# Patient Record
Sex: Male | Born: 1935 | Race: White | Hispanic: No | Smoking: Never smoker
Health system: Southern US, Community
[De-identification: ages and names within clinical notes are randomized; demographics above are authoritative.]

## PROBLEM LIST (undated history)

## (undated) DIAGNOSIS — F05 Delirium due to known physiological condition: Secondary | ICD-10-CM

## (undated) DIAGNOSIS — N1330 Unspecified hydronephrosis: Secondary | ICD-10-CM

## (undated) DIAGNOSIS — I10 Essential (primary) hypertension: Secondary | ICD-10-CM

## (undated) DIAGNOSIS — F0390 Unspecified dementia without behavioral disturbance: Secondary | ICD-10-CM

## (undated) DIAGNOSIS — D649 Anemia, unspecified: Secondary | ICD-10-CM

## (undated) HISTORY — DX: Delirium due to known physiological condition: F05

## (undated) HISTORY — PX: TIBIA FRACTURE SURGERY: SHX806

## (undated) HISTORY — DX: Essential (primary) hypertension: I10

## (undated) HISTORY — PX: BYPASS GRAFT: SHX909

---

## 2016-08-23 ENCOUNTER — Encounter (HOSPITAL_COMMUNITY): Payer: Self-pay | Admitting: Emergency Medicine

## 2016-08-23 ENCOUNTER — Emergency Department (HOSPITAL_COMMUNITY)
Admission: EM | Admit: 2016-08-23 | Discharge: 2016-08-24 | Disposition: A | Discharge status: Home / Self Care | Payer: Medicare Other | Attending: Emergency Medicine | Admitting: Emergency Medicine

## 2016-08-23 DIAGNOSIS — L03031 Cellulitis of right toe: Secondary | ICD-10-CM | POA: Diagnosis not present

## 2016-08-23 DIAGNOSIS — F0390 Unspecified dementia without behavioral disturbance: Secondary | ICD-10-CM | POA: Insufficient documentation

## 2016-08-23 DIAGNOSIS — Z79899 Other long term (current) drug therapy: Secondary | ICD-10-CM | POA: Diagnosis not present

## 2016-08-23 DIAGNOSIS — M79671 Pain in right foot: Secondary | ICD-10-CM | POA: Diagnosis present

## 2016-08-23 NOTE — ED Triage Notes (Addendum)
Patient just came from [place]. Patient complaining of left foot is warm to touch. Foot has deformities and patient is on antibiotics from podiatry doctor in [place]. Patient has dementia. Patient takes Atenolol, Clonazepam, Ramipril, Zoloft, Zydus, Quetiapine, clindamycin, and ciprofloxacin but do not know the dose per EMS.

## 2016-08-23 NOTE — ED Provider Notes (Addendum)
[facility]-EMERGENCY DEPT Provider Note   CSN: [id number] Arrival date & time: 08/23/16  2239  By signing my name below, I, Nelwyn Salisbury, attest that this documentation has been prepared under the direction and in the presence of Gerhard Munch, MD . Electronically Signed: Nelwyn Salisbury, Scribe. 08/23/2016. 11:07 PM.  History   Chief Complaint Chief Complaint  Patient presents with  . Foot Pain    LEVEL 5 CAVEAT DUE TO DEMENTIA  The history is provided by the patient and a relative. No language interpreter was used.     HPI Comments:  Guy Seese is a 82 y.o. male who presents to the Emergency Department complaining of right foot pain. Pt references being brought here by his father. Pt is unable to answer questions due to dementia. Pt's family states that before they traveled here to [place], the Pt was seen at the family doctor who was concerned about an infection. They were told to bring him to the hospital once they landed. Pt's family reports that he experiences intermittent worsening of his dementia symptoms, and notes that he was walking around in the hotel lobby for 90 minutes with delusions yesterday. Pt's family states that he is currently taking Cipro and Cephelexin for his infection. Family denies any recent dizziness, nausea, or vomiting.   History reviewed. No pertinent past medical history.  There are no active problems to display for this patient.   No past surgical history on file.     Home Medications    Prior to Admission medications   Medication Sig Start Date End Date Taking? Authorizing Provider  atenolol (TENORMIN) 25 MG tablet Take 25 mg by mouth daily.   Yes Historical Provider, MD  ramipril (ALTACE) 10 MG capsule Take 10 mg by mouth daily.   Yes Historical Provider, MD  sertraline (ZOLOFT) 100 MG tablet Take 100 mg by mouth daily.   Yes Historical Provider, MD  ciprofloxacin (CIPRO) 500 MG tablet Take 500 mg by mouth 2 (two) times daily. 08/21/16    Historical Provider, MD  clindamycin (CLEOCIN) 150 MG capsule Take 150 mg by mouth 3 (three) times daily. 08/21/16   Historical Provider, MD  clonazePAM (KLONOPIN) 0.5 MG tablet Take 0.5 mg by mouth at bedtime as needed (FOR SLEEP).    Historical Provider, MD  QUEtiapine (SEROQUEL) 50 MG tablet Take 50 mg by mouth every 12 (twelve) hours. 08/15/16   Historical Provider, MD    Family History History reviewed. No pertinent family history.  Social History Social History  Substance Use Topics  . Smoking status: Not on file  . Smokeless tobacco: Not on file  . Alcohol use Not on file     Allergies   Review of patient's allergies indicates no known allergies.   Review of Systems Review of Systems  Unable to perform ROS: Dementia  Constitutional:       Per HPI, otherwise negative  HENT:       Per HPI, otherwise negative  Respiratory:       Per HPI, otherwise negative  Cardiovascular:       Per HPI, otherwise negative  Gastrointestinal: Negative for vomiting.  Endocrine:       Negative aside from HPI  Genitourinary:       Neg aside from HPI   Musculoskeletal:       Per HPI, otherwise negative  Skin: Negative.   Neurological: Negative for syncope.     Physical Exam Updated Vital Signs BP (!) 201/75 (BP Location: Left Arm)  Pulse 68   Temp 97.6 F (36.4 C) (Oral)   Resp 19   SpO2 100%   Physical Exam  Constitutional: He is oriented to person, place, and time. He appears well-developed. No distress.  HENT:  Head: Normocephalic and atraumatic.  Eyes: Conjunctivae and EOM are normal.  Cardiovascular: Normal rate and regular rhythm.   Pulmonary/Chest: Effort normal. No stridor. No respiratory distress.  Abdominal: He exhibits no distension.  Musculoskeletal: He exhibits edema.  Right distal left extremity has old skin graft on anterior surface that is healthy in appearance. Right foot is edematous but not erythematous. On plantar aspect of great toe, there is a 2cm  open wound. No active bleeding or drainage. Nail is missing. Appreciable DP pulse, Cap refill 2.  Neurological: He is alert and oriented to person, place, and time.  Skin: Skin is warm and dry. Capillary refill takes less than 2 seconds.  Psychiatric: He has a normal mood and affect.  Nursing note and vitals reviewed.    ED Treatments / Results   DIAGNOSTIC STUDIES:  Oxygen Saturation is 100% on RA, normal by my interpretation.    COORDINATION OF CARE:  11:22 PM Discussed treatment plan with pt at bedside and pt agreed to plan.  Labs (all labs ordered are listed, but only abnormal results are displayed) Labs Reviewed  BASIC METABOLIC PANEL - Abnormal; Notable for the following:       Result Value   BUN 21 (*)    Anion gap 4 (*)    All other components within normal limits  CBC WITH DIFFERENTIAL/PLATELET - Abnormal; Notable for the following:    Hemoglobin 11.7 (*)    HCT 37.3 (*)    MCH 25.4 (*)    All other components within normal limits    EKG  EKG Interpretation None       Radiology Dg Foot 2 Views Right  Result Date: 08/24/2016 CLINICAL DATA:  Acute onset of right foot cellulitis, with erythema and pain. Assess for osteomyelitis. Initial encounter. EXAM: RIGHT FOOT - 2 VIEW COMPARISON:  None. FINDINGS: There is no evidence of fracture or dislocation. No definite osseous erosions are seen. There is diffuse osteopenia of visualized osseous structures. Visualized hardware is noted along the distal tibia and fibula. There is question of an underlying chronic fracture through the tibial hardware. There is a chronic fracture of the inferior-most tibial screw. 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 The joint spaces are preserved. There is no evidence of talar subluxation; the subtalar joint is unremarkable in appearance. A plantar calcaneal spur is noted. Calcification is noted overlying the posterior aspect of the calcaneus. Diffuse vascular calcifications  are seen. Dorsal soft tissue swelling is noted at the forefoot. IMPRESSION: 1. No evidence of fracture or dislocation. No osseous erosions seen. 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 2. Diffuse osteopenia of visualized osseous structures limits evaluation for osteomyelitis. If there is significant persistent clinical concern for osteomyelitis, MRI could be considered for further evaluation, as long as there are no contraindications. 3. Question of underlying chronic fracture through the tibial hardware. Chronic fracture of the inferior-most tibial screw. 4. Diffuse vascular calcifications seen. 5. Dorsal soft tissue swelling at the forefoot. Electronically Signed   By: Roanna Raider M.D.   On: 08/24/2016 00:38    Procedures Procedures (including critical care time)  Medications Ordered in ED Medications - No data to display   Initial Impression / Assessment and Plan / ED Course  I  have reviewed the triage vital signs and the nursing notes.  Pertinent labs & imaging results that were available during my care of the patient were reviewed by me and considered in my medical decision making (see chart for details).  Clinical Course    Patient's family has departed, leaving the patient in the emergency department, though I previously inform them if the patient's evaluation was reassuring, he was appropriate for discharge with outpatient follow-up.  This elderly male presents with some concern for right foot wound. The wound itself is generally well healing and appearance, though there is an open area on the plantar surface of the right great toe. Patient has obvious dementia, but no evidence for decompensation here. Though the patient's medical evaluation was generally reassuring, with no evidence for osteomyelitis, bacteremia, sepsis, only local cellulitis, the patient was d/c w family to obtain outpatient f/u   Fi nal Clinical Impressions(s) / ED Diagnoses    I personally  performed the services described in this documentation, which was scribed in my presence. The recorded information has been reviewed and is accurate.       Gerhard Munchobert Krisandra Bueno, MD 08/24/16 [id number]    Gerhard Munchobert Ermine Stebbins, MD 08/24/16 [phone]

## 2016-08-23 NOTE — ED Notes (Signed)
Bed: WA08 Expected date:  Expected time:  Means of arrival:  Comments: EMS 82 yo male foot infection

## 2016-08-24 ENCOUNTER — Emergency Department (HOSPITAL_COMMUNITY): Payer: Medicare Other

## 2016-08-24 DIAGNOSIS — L03031 Cellulitis of right toe: Secondary | ICD-10-CM | POA: Diagnosis not present

## 2016-08-24 LAB — CBC WITH DIFFERENTIAL/PLATELET
BASOS ABS: 0 10*3/uL (ref 0.0–0.1)
BASOS PCT: 0 %
EOS ABS: 0.2 10*3/uL (ref 0.0–0.7)
Eosinophils Relative: 3 %
HEMATOCRIT: 37.3 % — AB (ref 39.0–52.0)
HEMOGLOBIN: 11.7 g/dL — AB (ref 13.0–17.0)
Lymphocytes Relative: 35 %
Lymphs Abs: 2.5 10*3/uL (ref 0.7–4.0)
MCH: 25.4 pg — ABNORMAL LOW (ref 26.0–34.0)
MCHC: 31.4 g/dL (ref 30.0–36.0)
MCV: 81.1 fL (ref 78.0–100.0)
Monocytes Absolute: 0.6 10*3/uL (ref 0.1–1.0)
Monocytes Relative: 8 %
NEUTROS ABS: 3.9 10*3/uL (ref 1.7–7.7)
NEUTROS PCT: 54 %
Platelets: 209 10*3/uL (ref 150–400)
RBC: 4.6 MIL/uL (ref 4.22–5.81)
RDW: 15 % (ref 11.5–15.5)
WBC: 7.3 10*3/uL (ref 4.0–10.5)

## 2016-08-24 LAB — BASIC METABOLIC PANEL
ANION GAP: 4 — AB (ref 5–15)
BUN: 21 mg/dL — ABNORMAL HIGH (ref 6–20)
CALCIUM: 9.2 mg/dL (ref 8.9–10.3)
CO2: 32 mmol/L (ref 22–32)
Chloride: 105 mmol/L (ref 101–111)
Creatinine, Ser: 0.71 mg/dL (ref 0.61–1.24)
Glucose, Bld: 80 mg/dL (ref 65–99)
Potassium: 4.2 mmol/L (ref 3.5–5.1)
SODIUM: 141 mmol/L (ref 135–145)

## 2016-08-24 MED ORDER — CIPROFLOXACIN HCL 500 MG PO TABS
500.0000 mg | ORAL_TABLET | Freq: Two times a day (BID) | ORAL | Status: DC
  Administered 2016-08-24: 500 mg via ORAL
  Filled 2016-08-24: qty 1

## 2016-08-24 MED ORDER — CLONAZEPAM 0.5 MG PO TABS
0.5000 mg | ORAL_TABLET | Freq: Every evening | ORAL | Status: DC | PRN
Start: 2016-08-24 — End: 2016-08-24
  Administered 2016-08-24: 0.5 mg via ORAL
  Filled 2016-08-24: qty 1

## 2016-08-24 MED ORDER — RAMIPRIL 10 MG PO CAPS
10.0000 mg | ORAL_CAPSULE | Freq: Every day | ORAL | Status: DC
Start: 2016-08-24 — End: 2016-08-24

## 2016-08-24 MED ORDER — QUETIAPINE FUMARATE 50 MG PO TABS
50.0000 mg | ORAL_TABLET | Freq: Two times a day (BID) | ORAL | Status: DC
  Administered 2016-08-24: 50 mg via ORAL
  Filled 2016-08-24: qty 1

## 2016-08-24 MED ORDER — CLINDAMYCIN HCL 150 MG PO CAPS: 150.0000 mg | ORAL_CAPSULE | Freq: Three times a day (TID) | ORAL | Status: DC

## 2016-08-24 MED ORDER — SERTRALINE HCL 50 MG PO TABS: 100.0000 mg | ORAL_TABLET | Freq: Every day | ORAL | Status: DC

## 2016-08-24 MED ORDER — ATENOLOL 25 MG PO TABS: 25.0000 mg | ORAL_TABLET | Freq: Every day | ORAL | Status: DC

## 2016-08-24 NOTE — ED Notes (Signed)
Waiting on family to pick him up.

## 2016-08-24 NOTE — ED Notes (Signed)
[facility] [phone]

## 2016-08-24 NOTE — Discharge Instructions (Signed)
As discussed, it is important that you establish care with a local physician. Please use the provided resources to obtain a primary care physician. Equally important is that you follow-up with a wound care specialist to insure that your foot heals. Please take all medication as directed.

## 2016-08-24 NOTE — ED Notes (Signed)
Patient verbally abusive to staff. Daughter arrived and [place]. Patient was helped to the bathroom and helped into the uber.

## 2016-08-24 NOTE — ED Notes (Signed)
Patient right foot was wrapped with gauze, padding placed on big toe, sleeve placed back on foot, and boot was placed back on patients foot.

## 2016-09-03 ENCOUNTER — Encounter: Payer: Self-pay | Admitting: Internal Medicine

## 2016-09-03 ENCOUNTER — Ambulatory Visit: Payer: Medicare Other | Attending: Internal Medicine | Admitting: Internal Medicine

## 2016-09-03 VITALS — BP 149/68 | HR 58 | Temp 97.5°F | Resp 20 | Wt 142.0 lb

## 2016-09-03 DIAGNOSIS — M7989 Other specified soft tissue disorders: Secondary | ICD-10-CM | POA: Diagnosis present

## 2016-09-03 DIAGNOSIS — M8580 Other specified disorders of bone density and structure, unspecified site: Secondary | ICD-10-CM

## 2016-09-03 DIAGNOSIS — F0390 Unspecified dementia without behavioral disturbance: Secondary | ICD-10-CM | POA: Diagnosis not present

## 2016-09-03 DIAGNOSIS — Z789 Other specified health status: Secondary | ICD-10-CM

## 2016-09-03 DIAGNOSIS — R4189 Other symptoms and signs involving cognitive functions and awareness: Secondary | ICD-10-CM | POA: Diagnosis not present

## 2016-09-03 DIAGNOSIS — L03116 Cellulitis of left lower limb: Secondary | ICD-10-CM | POA: Diagnosis not present

## 2016-09-03 DIAGNOSIS — Z79899 Other long term (current) drug therapy: Secondary | ICD-10-CM | POA: Insufficient documentation

## 2016-09-03 MED ORDER — SACCHAROMYCES BOULARDII 250 MG PO CAPS: 250.0000 mg | ORAL_CAPSULE | Freq: Two times a day (BID) | ORAL | 0 refills | Status: DC

## 2016-09-03 MED ORDER — CIPROFLOXACIN HCL 500 MG PO TABS: 500.0000 mg | ORAL_TABLET | Freq: Two times a day (BID) | ORAL | 1 refills | Status: DC

## 2016-09-03 MED ORDER — CLINDAMYCIN HCL 150 MG PO CAPS: 150.0000 mg | ORAL_CAPSULE | Freq: Three times a day (TID) | ORAL | 0 refills | Status: DC

## 2016-09-03 NOTE — Progress Notes (Signed)
Here for Rt ankle pain/edema.  Patient history of dementia. Right calf edema Appt at [facility] October 18th.

## 2016-09-03 NOTE — Progress Notes (Signed)
Scott MainlandJohn Park, is a 82 y.o. male  ZOX:[id number]  [id number]:[id number]  DOB - 10/25/1933  CC:  Chief Complaint  Patient presents with  . Wound Check  . Foot Swelling       HPI: Scott Park is a 82 y.o. male here today to establish medical care.  [place]22[place].  Hx of Mild cognitive impairment, dementia and hx of prior right foot surgery years ago for trauma.  He called his podiatrist in [place] 08/22/16 when they were on their way out of [place], for concern for right foot infection, and was told to go to ER.  Pt subsequently went to our ER on 08/23/16 and was rx cipro and cephelexin.    Per Ex- dgt-in- law here w/ pt, pt does sometimes refuse meds at home, but they think he took all his antibiotics that were prescribed 08/23/16.  DIL states that she thinks the right foot looks worse than the ER visit, w/ more edema, swollen and red.  DIL also states that he does not like to elevate his foot either.  Pt denies pain, does have decrease sensation in the foot.  He denies f/c.    Walks w/ walker. Denies recent falls.    Patient has No headache, No chest pain, No abdominal pain - No Nausea, No new weakness tingling or numbness, No Cough - SOB.    Review of Systems: Per HPI, o/w all systems reviewed, limited given pt's dementia.   No Known Allergies No past medical history on file. Current Outpatient Prescriptions on File Prior to Visit  Medication Sig Dispense Refill  . atenolol (TENORMIN) 25 MG tablet Take 25 mg by mouth daily.    . clonazePAM (KLONOPIN) 0.5 MG tablet Take 0.5 mg by mouth at bedtime as needed (FOR SLEEP).    . ramipril (ALTACE) 10 MG capsule Take 10 mg by mouth daily.    . sertraline (ZOLOFT) 100 MG tablet Take 100 mg by mouth daily.    . QUEtiapine (SEROQUEL) 50 MG tablet Take 50 mg by mouth every 12 (twelve) hours.  5   No current facility-administered medications on file prior to visit.    No family history on file. Social  History   Social History  . Marital status: Widowed    Spouse name: N/A  . Number of children: N/A  . Years of education: N/A   Occupational History  . Not on file.   Social History Main Topics  . Smoking status: Never Smoker  . Smokeless tobacco: Never Used  . Alcohol use Not on file  . Drug use: Unknown  . Sexual activity: Not on file   Other Topics Concern  . Not on file   Social History Narrative  . No narrative on file    Objective:   Vitals:   09/03/16 1405  BP: (!) 149/68  Pulse: (!) 58  Resp: 20  Temp: 97.5 F (36.4 C)    Filed Weights   09/03/16 1405  Weight: 142 lb (64.4 kg)    BP Readings from Last 3 Encounters:  09/03/16 (!) 149/68  08/24/16 156/98    Physical Exam: Constitutional: Patient appears well-developed and well-nourished. No distress.  Pleasantly confused, tangential thought processes.  Walking w/ walker. HENT: Normocephalic, atraumatic, External right and left ear normal. Oropharynx is clear and moist.  Eyes: Conjunctivae and EOM are normal. PERRL, no scleral icterus. Neck: Normal ROM. Neck supple. No JVD.  CVS: RRR, S1/S2 +, no murmurs, no  gallops, no carotid bruit.  Pulmonary: Effort and breath sounds normal, no stridor, rhonchi, wheezes, rales.  Abdominal: Soft. BS +, no distension, tenderness, rebound or guarding.  Musculoskeletal: Normal range of motion. No edema and no tenderness.  LE: left le wnl, no edema. Right foot w/ old scaring/skin graft. Erythema and edema up to ankles, pustes 1+ on right, edema 3+, pitting, there is a 2cm dry callous on plantar aspect of great toe, scaly.  Neuro: Alert. muscle tone coordination wnl. No cranial nerve deficit grossly. Skin: Skin is warm and dry. No rash noted. Not diaphoretic. No erythema. No pallor. Psychiatric: Normal mood and affect. Behavior, judgment, thought content normal.  Lab Results  Component Value Date   WBC 7.3 08/24/2016   HGB 11.7 (L) 08/24/2016   HCT 37.3 (L)  08/24/2016   MCV 81.1 08/24/2016   PLT 209 08/24/2016   Lab Results  Component Value Date   CREATININE 0.71 08/24/2016   BUN 21 (H) 08/24/2016   NA 141 08/24/2016   K 4.2 08/24/2016   CL 105 08/24/2016   CO2 32 08/24/2016    No results found for: HGBA1C Lipid Panel  No results found for: CHOL, TRIG, HDL, CHOLHDL, VLDL, LDLCALC     Depression screen [facility] 2/9 09/03/2016  Decreased Interest 0  Down, Depressed, Hopeless 0  PHQ - 2 Score 0    08/24/16 right foot xray EXAM: RIGHT FOOT - 2 VIEW  COMPARISON:  None.  FINDINGS: There is no evidence of fracture or dislocation. No definite osseous erosions are seen. There is diffuse osteopenia of visualized osseous structures. Visualized hardware is noted along the distal tibia and fibula. There is question of an underlying chronic fracture through the tibial hardware. There is a chronic fracture of the inferior-most tibial screw.  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  The joint spaces are preserved. There is no evidence of talar subluxation; the subtalar joint is unremarkable in appearance. A plantar calcaneal spur is noted. Calcification is noted overlying the posterior aspect of the calcaneus.  Diffuse vascular calcifications are seen. Dorsal soft tissue swelling is noted at the forefoot.  IMPRESSION: 1. No evidence of fracture or dislocation. No osseous erosions seen. 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 2. Diffuse osteopenia of visualized osseous structures limits evaluation for osteomyelitis. If there is significant persistent clinical concern for osteomyelitis, MRI could be considered for further evaluation, as long as there are no contraindications. 3. Question of underlying chronic fracture through the tibial hardware. Chronic fracture of the inferior-most tibial screw. 4. Diffuse vascular calcifications seen. 5. Dorsal soft tissue swelling at the forefoot.   Electronically  Signed   By: Roanna Raider M.D.   On: 08/24/2016 00:38  Assessment and plan:   1. Cellulitis of left foot, concern for osteomyelitis. - MR Foot Right W Wo Contrast; Future - renewed cipro and clindamycin x 7 more days, will need probiotics as well while on abx to prevent diarrhea - pt has Podiatry appt 09/09/16 - high risk for amputation given worrisome exam and failure to recent abx treatment. - hgh risk for needing hospitalization for iv abx and possible surgical debriedment. - f/u w/ me in 2 wks.  2. Osteopenia - MR Foot Right W Wo Contrast; Future  3. Dementia, without behavioral disturbance 4. Cognitive impairment  - mild /mod per DIL 5. Hold off on flu vac for now given acute illness.   Return in about 2 weeks (around 09/17/2016) for foot infection .  The patient was given clear instructions to go to ER or return to medical center if symptoms don't improve, worsen or new problems develop. The patient verbalized understanding. The patient was told to call to get lab results if they haven't heard anything in the next week.    This note has been created with Education officer, environmental. Any transcriptional errors are unintentional.   Pete Glatter, MD, MBA/MHA [facility] [place], [place] [phone]   09/03/2016, 2:59 PM

## 2016-09-03 NOTE — Patient Instructions (Signed)
Cellulitis °Cellulitis is an infection of the skin and the tissue under the skin. The infected area is usually red and tender. This happens most often in the arms and lower legs. °HOME CARE  °· Take your antibiotic medicine as told. Finish the medicine even if you start to feel better. °· Keep the infected arm or leg raised (elevated). °· Put a warm cloth on the area up to 4 times per day. °· Only take medicines as told by your doctor. °· Keep all doctor visits as told. °GET HELP IF: °· You see red streaks on the skin coming from the infected area. °· Your red area gets bigger or turns a dark color. °· Your bone or joint under the infected area is painful after the skin heals. °· Your infection comes back in the same area or different area. °· You have a puffy (swollen) bump in the infected area. °· You have new symptoms. °· You have a fever. °GET HELP RIGHT AWAY IF:  °· You feel very sleepy. °· You throw up (vomit) or have watery poop (diarrhea). °· You feel sick and have muscle aches and pains. °  °This information is not intended to replace advice given to you by your health care provider. Make sure you discuss any questions you have with your health care provider. °  °Document Released: 05/27/2008 Document Revised: 08/30/2015 Document Reviewed: 02/24/2012 °Elsevier Interactive Patient Education ©2016 [employer] Inc. ° °

## 2016-09-09 ENCOUNTER — Encounter: Payer: Self-pay | Admitting: Podiatry

## 2016-09-09 ENCOUNTER — Ambulatory Visit (INDEPENDENT_AMBULATORY_CARE_PROVIDER_SITE_OTHER): Payer: Medicare Other | Admitting: Podiatry

## 2016-09-09 VITALS — Ht 72.0 in | Wt 142.0 lb

## 2016-09-09 DIAGNOSIS — B351 Tinea unguium: Secondary | ICD-10-CM | POA: Diagnosis not present

## 2016-09-09 DIAGNOSIS — L84 Corns and callosities: Secondary | ICD-10-CM

## 2016-09-09 DIAGNOSIS — M79605 Pain in left leg: Secondary | ICD-10-CM | POA: Diagnosis not present

## 2016-09-09 DIAGNOSIS — M79604 Pain in right leg: Secondary | ICD-10-CM

## 2016-09-09 NOTE — Progress Notes (Signed)
   Subjective:    Patient ID: Scott Park, male    DOB: 12/13/1933, 82 y.o.   MRN: [id number]  HPI    Review of Systems     Objective:   Physical Exam        Assessment & Plan:

## 2016-09-09 NOTE — Progress Notes (Signed)
   Subjective:    Patient ID: Scott Park, male    DOB: 06/07/1933, 82 y.o.   MRN: [id number]  HPI Chief Complaint  Patient presents with  . Foot Ulcer    R great toe ... Here with daughter who is concerned of swelling of ankle and calf. Pt just recently moved to [place]       Review of Systems  Constitutional: Positive for unexpected weight change.  HENT: Positive for tinnitus.   Gastrointestinal: Positive for diarrhea.  Musculoskeletal: Positive for back pain and gait problem.  Neurological: Positive for headaches.  Hematological: Bruises/bleeds easily.  Psychiatric/Behavioral: Positive for confusion and hallucinations.       Objective:   Physical Exam        Assessment & Plan:

## 2016-09-10 ENCOUNTER — Ambulatory Visit (HOSPITAL_COMMUNITY): Admission: RE | Admit: 2016-09-10 | Payer: Medicare Other | Source: Ambulatory Visit

## 2016-09-11 NOTE — Progress Notes (Signed)
Subjective:     Patient ID: Scott Park, male   DOB: 01/06/1933, 82 y.o.   MRN: [id number]  HPI patient states that he's just moved here from [place] and he was treated for a callus right hallux with possibility for low-grade ulceration and it seems to be doing better but he needed follow-up and he needs these lesions trimmed   Review of Systems  All other systems reviewed and are negative.      Objective:   Physical Exam  Constitutional: He is oriented to person, place, and time.  Cardiovascular: Intact distal pulses.   Musculoskeletal: Normal range of motion.  Neurological: He is oriented to person, place, and time.  Skin: Skin is warm and dry.  Nursing note and vitals reviewed.  neurovascular status found to be intact with muscle strength diminished range of motion diminished subtalar midtarsal joint. Patient's noted to have keratotic tissue sub-hallux right localized in nature with keratotic tissue noted on other digits localized with digital deformities bilateral. Patient's noted to have good digital perfusion well oriented 3 and also is found to have thickened nails 1-5 both feet that are not currently tender but as they get longer they become more of an issue     Assessment:     Nail infection with keratotic tissue formation with no indication currently of ulceration    Plan:     H&P conditions reviewed debridement accomplished and discussed long-term chronic treatment of conditions. Patient's family wants this done and we will do this on an ongoing basis and no iatrogenic bleeding or drainage noted upon debridement

## 2016-09-18 ENCOUNTER — Encounter (HOSPITAL_BASED_OUTPATIENT_CLINIC_OR_DEPARTMENT_OTHER): Payer: Medicare Other

## 2016-09-25 ENCOUNTER — Ambulatory Visit (INDEPENDENT_AMBULATORY_CARE_PROVIDER_SITE_OTHER): Payer: Medicare Other | Admitting: Podiatry

## 2016-09-25 ENCOUNTER — Encounter: Payer: Self-pay | Admitting: Podiatry

## 2016-09-25 DIAGNOSIS — M79671 Pain in right foot: Secondary | ICD-10-CM | POA: Diagnosis not present

## 2016-09-25 DIAGNOSIS — M779 Enthesopathy, unspecified: Secondary | ICD-10-CM | POA: Diagnosis not present

## 2016-09-25 DIAGNOSIS — M79605 Pain in left leg: Secondary | ICD-10-CM

## 2016-09-25 DIAGNOSIS — M79604 Pain in right leg: Secondary | ICD-10-CM | POA: Diagnosis not present

## 2016-09-26 NOTE — Progress Notes (Signed)
Subjective:     Patient ID: Scott Park, male   DOB: 07/12/1933, 82 y.o.   MRN: [id number]  HPI patient presents with caregiver in a belligerent mood and stating his right foot hurts but is unable to explain how   Review of Systems     Objective:   Physical Exam Neurovascular status unchanged with patient found to have minimal discomfort when I pressed from a plantar direction and is wearing surgical shoe currently    Assessment:     Difficult to understand why he is having this pain and he is quite belligerent with myself and with caregiver    Plan:     Attempted treatment and spent a good tilt time with him but I was unable to get him to focus and I had to have him leave the office at this time

## 2016-09-30 ENCOUNTER — Telehealth: Payer: Self-pay | Admitting: Internal Medicine

## 2016-09-30 ENCOUNTER — Telehealth: Payer: Self-pay

## 2016-09-30 NOTE — Telephone Encounter (Signed)
Patient dropped off paper for handicapped placard to be completed by the doctor. Please follow up.

## 2016-09-30 NOTE — Telephone Encounter (Signed)
Will forward to pcp

## 2016-09-30 NOTE — Telephone Encounter (Signed)
Contacted Bonita QuinLinda to make aware that handicapped form was ready for pick up lvm regarding information and if she has any questions or concerns to give me a call

## 2016-09-30 NOTE — Telephone Encounter (Signed)
Completed today. Gave to cma

## 2016-10-03 ENCOUNTER — Telehealth: Payer: Self-pay | Admitting: Internal Medicine

## 2016-10-03 NOTE — Telephone Encounter (Signed)
Patients daughter called requesting a refill for clonezapem. Please follow up.

## 2016-10-10 NOTE — Telephone Encounter (Signed)
Pt. Daughter called requesting a refill on clonazePAM (KLONOPIN) 0.5 MG tablet  Please f/u

## 2016-10-14 ENCOUNTER — Ambulatory Visit: Payer: Medicare Other | Admitting: Internal Medicine

## 2016-10-14 MED ORDER — CLONAZEPAM 0.5 MG PO TABS: 0.5000 mg | ORAL_TABLET | Freq: Every evening | ORAL | 0 refills | Status: DC | PRN

## 2016-10-14 NOTE — Telephone Encounter (Signed)
I renewed this rx for patient for now, but try to wean down as able, to 1/2 pill as needed for sleep.  Not best medicine for him in advance age. Please call. thanks

## 2016-10-15 NOTE — Telephone Encounter (Signed)
Contacted pt daughter and made aware of rx

## 2016-10-24 ENCOUNTER — Other Ambulatory Visit: Payer: Medicare Other

## 2016-11-13 ENCOUNTER — Ambulatory Visit (INDEPENDENT_AMBULATORY_CARE_PROVIDER_SITE_OTHER): Payer: Medicare Other | Admitting: Podiatry

## 2016-11-13 ENCOUNTER — Encounter: Payer: Self-pay | Admitting: Podiatry

## 2016-11-13 VITALS — BP 160/71 | HR 66 | Resp 16

## 2016-11-13 DIAGNOSIS — L97501 Non-pressure chronic ulcer of other part of unspecified foot limited to breakdown of skin: Secondary | ICD-10-CM

## 2016-11-13 NOTE — Progress Notes (Signed)
Subjective:     Patient ID: Scott Park, male   DOB: 03/07/1933, [age] y.o.   MRN: [id number]  HPI patient presents with crusted tissue the end of the right big toe that is localized in nature with no proximal edema erythema drainage noted. Patient does have significant damage to the right lower leg and history of circulatory issues which are dealt with by physician. Patient is not completely aware assessment at this time   Review of Systems     Objective:   Physical Exam Neurovascular status unchanged with patient noted to have distal keratotic lesion right hallux plantar surface with no active drainage no proximal edema erythema drainage noted. Measures approximately 4 x 4 mm and is localized with a healthy base that's granulating with no indication again of systemic pathology    Assessment:     Localized ulceration right and it hallux secondary to gait pattern    Plan:     Discussed and this is very difficult to offload and I did apply padding systems offload and applied Iodosorb to the area after debridement with no drainage noted. I gave strict instructions of any drainage or any other changes should occur to let us know immediately and if not we will utilize padding to keep pressure off the area along with Neosporin and reduced activity. Encouraged to call with any questions and I did explain to caregiver it's possible amputation may be necessary at one point in future

## 2016-11-19 ENCOUNTER — Telehealth: Payer: Self-pay | Admitting: Internal Medicine

## 2016-11-19 DIAGNOSIS — R4189 Other symptoms and signs involving cognitive functions and awareness: Secondary | ICD-10-CM

## 2016-11-19 NOTE — Telephone Encounter (Signed)
Patients relative is  Calling and stated that patient needs a preventive cardiologist. Patient is also asking to be referred to someone who specializes in dementia.

## 2016-11-19 NOTE — Telephone Encounter (Signed)
Will forward to pcp

## 2016-11-19 NOTE — Telephone Encounter (Signed)
Please have pt set up appt w/ me w/in 1 month, have not seen since sept. I am not certain what a preventative cardiologist is.. Please have them f/u w/ me first.    I put in referral for neuro for the formal dementia wkup. thanks

## 2016-11-19 NOTE — Telephone Encounter (Signed)
Patient needs a refill for Zoloft. please follow up.

## 2016-11-20 NOTE — Telephone Encounter (Signed)
Chadell if you would be able to call patient and set up an appointment when the schedule opens thanks

## 2016-11-20 NOTE — Telephone Encounter (Signed)
Contacted Bonita QuinLinda to make aware that Dr. Julien NordmannLangeland has put the referral in for the neuro but she would like him to schedule an appoitnment with her she is aware and understands and doesn't have any questions or concerns

## 2016-11-25 ENCOUNTER — Ambulatory Visit: Payer: Medicare Other | Attending: Internal Medicine | Admitting: Internal Medicine

## 2016-11-25 ENCOUNTER — Encounter: Payer: Self-pay | Admitting: Internal Medicine

## 2016-11-25 VITALS — BP 143/71 | HR 66 | Temp 97.6°F | Resp 16 | Wt 165.2 lb

## 2016-11-25 DIAGNOSIS — D519 Vitamin B12 deficiency anemia, unspecified: Secondary | ICD-10-CM | POA: Diagnosis not present

## 2016-11-25 DIAGNOSIS — R600 Localized edema: Secondary | ICD-10-CM

## 2016-11-25 DIAGNOSIS — D649 Anemia, unspecified: Secondary | ICD-10-CM

## 2016-11-25 DIAGNOSIS — Z23 Encounter for immunization: Secondary | ICD-10-CM | POA: Diagnosis not present

## 2016-11-25 DIAGNOSIS — F0391 Unspecified dementia with behavioral disturbance: Secondary | ICD-10-CM

## 2016-11-25 DIAGNOSIS — I10 Essential (primary) hypertension: Secondary | ICD-10-CM

## 2016-11-25 DIAGNOSIS — D529 Folate deficiency anemia, unspecified: Secondary | ICD-10-CM

## 2016-11-25 DIAGNOSIS — R4182 Altered mental status, unspecified: Secondary | ICD-10-CM | POA: Diagnosis not present

## 2016-11-25 DIAGNOSIS — E559 Vitamin D deficiency, unspecified: Secondary | ICD-10-CM | POA: Diagnosis not present

## 2016-11-25 DIAGNOSIS — F03918 Unspecified dementia, unspecified severity, with other behavioral disturbance: Secondary | ICD-10-CM | POA: Insufficient documentation

## 2016-11-25 LAB — BASIC METABOLIC PANEL WITH GFR
BUN: 18 mg/dL (ref 7–25)
CHLORIDE: 102 mmol/L (ref 98–110)
CO2: 32 mmol/L — AB (ref 20–31)
Calcium: 9.4 mg/dL (ref 8.6–10.3)
Creat: 0.82 mg/dL (ref 0.70–1.11)
GFR, EST NON AFRICAN AMERICAN: 83 mL/min (ref >=60)
GFR, Est African American: >89 mL/min (ref >=60)
Glucose, Bld: 102 mg/dL — ABNORMAL HIGH (ref 65–99)
Potassium: 4.2 mmol/L (ref 3.5–5.3)
SODIUM: 142 mmol/L (ref 135–146)

## 2016-11-25 LAB — CBC WITH DIFFERENTIAL/PLATELET
BASOS ABS: 0 {cells}/uL (ref 0–200)
Basophils Relative: 0 %
EOS PCT: 1 %
Eosinophils Absolute: 96 cells/uL (ref 15–500)
HCT: 38.6 % (ref 38.5–50.0)
Hemoglobin: 11.7 g/dL — ABNORMAL LOW (ref 13.2–17.1)
LYMPHS PCT: 11 %
Lymphs Abs: 1056 cells/uL (ref 850–3900)
MCH: 25.3 pg — AB (ref 27.0–33.0)
MCHC: 30.3 g/dL — AB (ref 32.0–36.0)
MCV: 83.5 fL (ref 80.0–100.0)
MONOS PCT: 7 %
MPV: 9.7 fL (ref 7.5–12.5)
Monocytes Absolute: 672 cells/uL (ref 200–950)
NEUTROS PCT: 81 %
Neutro Abs: 7776 cells/uL (ref 1500–7800)
Platelets: 287 10*3/uL (ref 140–400)
RBC: 4.62 MIL/uL (ref 4.20–5.80)
RDW: 14.8 % (ref 11.0–15.0)
WBC: 9.6 10*3/uL (ref 3.8–10.8)

## 2016-11-25 LAB — VITAMIN B12: Vitamin B-12: 335 pg/mL (ref 200–1100)

## 2016-11-25 LAB — FOLATE: Folate: 14.2 ng/mL (ref >5.4)

## 2016-11-25 MED ORDER — SERTRALINE HCL 100 MG PO TABS
100.0000 mg | ORAL_TABLET | Freq: Every day | ORAL | 3 refills | Status: DC
Start: 2016-11-25 — End: 2017-01-28

## 2016-11-25 MED ORDER — ATENOLOL 25 MG PO TABS
25.0000 mg | ORAL_TABLET | Freq: Every day | ORAL | 3 refills | Status: DC
Start: 2016-11-25 — End: 2017-02-27

## 2016-11-25 MED ORDER — RAMIPRIL 10 MG PO CAPS: 10.0000 mg | ORAL_CAPSULE | Freq: Every day | ORAL | 3 refills | Status: DC

## 2016-11-25 NOTE — Progress Notes (Signed)
Pt is in the office today for a follow up Pt states he is not in any pain

## 2016-11-25 NOTE — Patient Instructions (Addendum)
Scott Park /rn bp check 2 wks     Edema Edema is an abnormal buildup of fluids. It is more common in your legs and thighs. Painless swelling of the feet and ankles is more likely as a person ages. It also is common in looser skin, like around your eyes. Follow these instructions at home:  Keep the affected body part above the level of the heart while lying down.  Do not sit still or stand for a long time.  Do not put anything right under your knees when you lie down.  Do not wear tight clothes on your upper legs.  Exercise your legs to help the puffiness (swelling) go down.  Wear elastic bandages or support stockings as told by your doctor.  A low-salt diet may help lessen the puffiness.  Only take medicine as told by your doctor. Contact a doctor if:  Treatment is not working.  You have heart, liver, or kidney disease and notice that your skin looks puffy or shiny.  You have puffiness in your legs that does not get better when you raise your legs.  You have sudden weight gain for no reason. Get help right away if:  You have shortness of breath or chest pain.  You cannot breathe when you lie down.  You have pain, redness, or warmth in the areas that are puffy.  You have heart, liver, or kidney disease and get edema all of a sudden.  You have a fever and your symptoms get worse all of a sudden. This information is not intended to replace advice given to you by your health care provider. Make sure you discuss any questions you have with your health care provider. Document Released: 05/27/2008 Document Revised: 05/16/2016 Document Reviewed: 10/01/2013 Elsevier Interactive Patient Education  2017 Elsevier Inc. Influenza Virus Vaccine injection (Fluarix) What is this medicine? INFLUENZA VIRUS VACCINE (in floo EN zuh VAHY ruhs vak SEEN) helps to reduce the risk of getting influenza also known as the flu. This medicine may be used for other purposes; ask your health care  provider or pharmacist if you have questions. COMMON BRAND NAME(S): Fluarix, Fluzone What should I tell my health care provider before I take this medicine? They need to know if you have any of these conditions: -bleeding disorder like hemophilia -fever or infection -Guillain-Barre syndrome or other neurological problems -immune system problems -infection with the human immunodeficiency virus (HIV) or AIDS -low blood platelet counts -multiple sclerosis -an unusual or allergic reaction to influenza virus vaccine, eggs, chicken proteins, latex, gentamicin, other medicines, foods, dyes or preservatives -pregnant or trying to get pregnant -breast-feeding How should I use this medicine? This vaccine is for injection into a muscle. It is given by a health care professional. A copy of Vaccine Information Statements will be given before each vaccination. Read this sheet carefully each time. The sheet may change frequently. Talk to your pediatrician regarding the use of this medicine in children. Special care may be needed. Overdosage: If you think you have taken too much of this medicine contact a poison control center or emergency room at once. NOTE: This medicine is only for you. Do not share this medicine with others. What if I miss a dose? This does not apply. What may interact with this medicine? -chemotherapy or radiation therapy -medicines that lower your immune system like etanercept, anakinra, infliximab, and adalimumab -medicines that treat or prevent blood clots like warfarin -phenytoin -steroid medicines like prednisone or cortisone -theophylline -vaccines This list  may not describe all possible interactions. Give your health care provider a list of all the medicines, herbs, non-prescription drugs, or dietary supplements you use. Also tell them if you smoke, drink alcohol, or use illegal drugs. Some items may interact with your medicine. What should I watch for while using this  medicine? Report any side effects that do not go away within 3 days to your doctor or health care professional. Call your health care provider if any unusual symptoms occur within 6 weeks of receiving this vaccine. You may still catch the flu, but the illness is not usually as bad. You cannot get the flu from the vaccine. The vaccine will not protect against colds or other illnesses that may cause fever. The vaccine is needed every year. What side effects may I notice from receiving this medicine? Side effects that you should report to your doctor or health care professional as soon as possible: -allergic reactions like skin rash, itching or hives, swelling of the face, lips, or tongue Side effects that usually do not require medical attention (report to your doctor or health care professional if they continue or are bothersome): -fever -headache -muscle aches and pains -pain, tenderness, redness, or swelling at site where injected -weak or tired This list may not describe all possible side effects. Call your doctor for medical advice about side effects. You may report side effects to FDA at 1-800-FDA-1088. Where should I keep my medicine? This vaccine is only given in a clinic, pharmacy, doctor's office, or other health care setting and will not be stored at home. NOTE: This sheet is a summary. It may not cover all possible information. If you have questions about this medicine, talk to your doctor, pharmacist, or health care provider.  2017 Elsevier/Gold Standard (2008-07-06 09:30:40)

## 2016-11-26 ENCOUNTER — Other Ambulatory Visit: Payer: Self-pay | Admitting: Internal Medicine

## 2016-11-26 DIAGNOSIS — R600 Localized edema: Secondary | ICD-10-CM

## 2016-11-26 LAB — IRON,TIBC AND FERRITIN PANEL
%SAT: 10 % — AB (ref 15–60)
FERRITIN: 99 ng/mL (ref 20–380)
IRON: 32 ug/dL — AB (ref 50–180)
TIBC: 331 ug/dL (ref 250–425)

## 2016-11-26 LAB — RPR

## 2016-11-26 LAB — BRAIN NATRIURETIC PEPTIDE: BRAIN NATRIURETIC PEPTIDE: 149.5 pg/mL — AB (ref <100)

## 2016-11-26 LAB — VITAMIN D 25 HYDROXY (VIT D DEFICIENCY, FRACTURES): VIT D 25 HYDROXY: 25 ng/mL — AB (ref 30–100)

## 2016-11-26 MED ORDER — SENNOSIDES-DOCUSATE SODIUM 8.6-50 MG PO TABS: 1.0000 | ORAL_TABLET | Freq: Every evening | ORAL | 2 refills | Status: DC | PRN

## 2016-11-26 MED ORDER — FERROUS SULFATE 325 (65 FE) MG PO TABS: 325.0000 mg | ORAL_TABLET | Freq: Every day | ORAL | 3 refills | Status: DC

## 2016-11-26 MED ORDER — VITAMIN D (ERGOCALCIFEROL) 1.25 MG (50000 UNIT) PO CAPS: 50000.0000 [IU] | ORAL_CAPSULE | ORAL | 0 refills | Status: DC

## 2016-11-26 NOTE — Progress Notes (Signed)
Scott Park, is a 82 y.o. male  ZOX:[id number]:[id number]  [id number]  DOB - 04/06/1933  Chief Complaint  Patient presents with  . Follow-up        Subjective:   Scott Park is a 82 y.o. 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  Pt denies c/o, no issues, although hx is limited to patient's confusion.  Pt ex-dgt-in-law, he has been agitated lately, and the rare times they use Klonopin (not prescribed from this office), appears  To be working less and less. They have a neurology apt pending soon for formal dementia workup.  Of note, prior to moving here, pt saw "Preventative cardiologist", with regular stress test per ex-DIL, although she does not know the results of the studies. Nor does she know if pt has every had MI/cardiac procedures, as far as she knows.      Patient has No headache, No chest pain, No abdominal pain - No Nausea, No new weakness tingling or numbness, No Cough - SOB.  Per ex-DIL, pt has been sleeping in recliner for years w/ his legs up. Recently recliner has broken, so he is unable to elevate his legs.  They notice increasing swelling in his legs lately.  t also noted to be very confused the other night and was wondering around the house unattended for time til discovered.  ROS/ limited due to pt's confusion/dementia.  No problems updated.  ALLERGIES: Allergies  Allergen Reactions  . Codeine Anxiety    PAST MEDICAL HISTORY: No past medical history on file.  MEDICATIONS AT HOME: Prior to Admission medications   Medication Sig Start Date End Date Taking? Authorizing Provider  aspirin EC 81 MG tablet Take 81 mg by mouth daily.    Historical Provider, MD  atenolol (TENORMIN) 25 MG tablet Take 1 tablet (25 mg total) by mouth daily. 11/25/16   Pete Glatterawn T Evamaria Detore, MD  clonazePAM (KLONOPIN) 0.5 MG  tablet Take 1 tablet (0.5 mg total) by mouth at bedtime as needed (FOR SLEEP). 10/14/16   Pete Glatterawn T Horris Speros, MD  QUEtiapine (SEROQUEL) 50 MG tablet Take 50 mg by mouth every 12 (twelve) hours. 08/15/16   Historical Provider, MD  ramipril (ALTACE) 10 MG capsule Take 1 capsule (10 mg total) by mouth daily. 11/25/16   Pete Glatterawn T Britnay Magnussen, MD  saccharomyces boulardii (FLORASTOR) 250 MG capsule Take 1 capsule (250 mg total) by mouth 2 (two) times daily. Patient not taking: Reported on 11/25/2016 09/03/16   Pete Glatterawn T Jeri Rawlins, MD  sertraline (ZOLOFT) 100 MG tablet Take 1 tablet (100 mg total) by mouth daily. 11/25/16   Pete Glatterawn T Ejay Lashley, MD     Objective:   Vitals:   11/25/16 1434  BP: (!) 143/71  Pulse: 66  Resp: 16  Temp: 97.6 F (36.4 C)  TempSrc: Oral  SpO2: 98%  Weight: 165 lb 3.2 oz (74.9 kg)    Exam General appearance : Awake, alert, not in any distress. Speech Clear, but disorganized/confused. Not toxic looking, thin appearing. HEENT: Atraumatic and Normocephalic, pupils equally reactive to light. Neck: supple, no JVD.  Chest:Good air entry bilaterally, no added sounds. CVS: S1 S2 regular, no murmurs/gallups or rubs. Abdomen: Bowel sounds active, Non tender and not distended with no gaurding, rigidity or rebound. Extremities: B/L Lower Ext shows 1+ edema bilat, both legs are warm to touch Neurology: Awake  alert, confused.  CN II-XII grossly intact, Non focal Skin:No Rash  Data Review No results found for: HGBA1C  Depression screen [facility] 2/9 11/25/2016 09/03/2016  Decreased Interest 0 0  Down, Depressed, Hopeless 0 0  PHQ - 2 Score 0 0      Assessment & Plan   1. HTN (hypertension), benign Slightly elevated.  Given pt's confusion - BASIC METABOLIC PANEL WITH GFR - keep meds same for now, ramipril and atenolol - hesitant about adding diurectic due to confusion, and concern for dehyration. - RN bp chk in 2 wks - if SBP remains >140s, increase ramipril to 20mg  qday.  2. Dementia  with behavioral disturbance, unspecified dementia type - Neuro appt pending for further eval, per ex-DIL, she has pt's outside MRI brain scan on disc as well. - RPR. chk b12 and folate as well  3. Pedal edema, suspect due to dependent edema - rx for compression stockings provided - Brain natriuretic peptide  4. Anemia, unspecified type - Iron, TIBC and Ferritin Panel - CBC with Differential - chk  5. Vitamin D deficiency concerns - Vitamin D, 25-hydroxy  6. Altered mental status, unspecified altered mental status type See #2 - renewed seroquel and zoloft, per ex-DIL, pt has been on it for years, if does not take it, becomes extremely agitated/confused.  7. Anemia due to folic acid deficiency, concerns - chk folate (also for dementia workup)  8. Anemia due to vitamin B12 deficiency, concerns - Vitamin B12 (also part of dementia workup.)   9. Encounter for immunization - Flu Vaccine QUAD 36+ mos IM     Patient have been counseled extensively about nutrition and exercise  Return in about 2 months (around 01/26/2017).  The patient was given clear instructions to go to ER or return to medical center if symptoms don't improve, worsen or new problems develop. The patient verbalized understanding. The patient was told to call to get lab results if they haven't heard anything in the next week.   This note has been created with Education officer, environmentalDragon speech recognition software and smart phrase technology. Any transcriptional errors are unintentional.   Pete Glatterawn T Karima Carrell, MD, MBA/MHA [facility] [place], [place] [phone]   11/26/2016, 9:04 AM

## 2016-11-27 ENCOUNTER — Encounter: Payer: Self-pay | Admitting: Internal Medicine

## 2016-11-27 NOTE — Progress Notes (Unsigned)
Documentation received from [facility],  [place], [place], [place] below//  Noted on PN 08/18/15; AScvd, cardiac cath  1/11 revealed mild ostial stenosis of LAD, mild anterior wall hypokinesia, ef 55%   1. Carotid intimal media thickness, 11/11 - per report, C-IMT avg 1.9119mm  2. Myocardial perfusion imaging, 08/21/09 abnml perfusion scan, showing an inferoseptal fixed defect c/w prior MI w/ moderate peri infarct ischemia mostly in inferior wall  3. Echo 10/23/10  Ef 65-70%, mild concetric lv hypertrophy  4. carotoid u/s. 5/12; repeated 09/06/13 similar report;  Less 50% bilat carotids  5. Carotid us 09/15/14 1. Total occlusion right ext carotid artery 2. [place] <50% 3. Lica, c/w 50-70% stenosis   6. abd aortic us. 11/05/12 No AAA, atherosclerotic changes noted.  7. Echo 10/29/12 Ef 65-70%  8. Echo 07/24/15 Ef 55%   Progress note / Dr Tory EmeraldJoseph Horgan. 01/11/16 1. htn 2. Hypercholesteremia 3. Anemia, 4. Left ventricular hypertrophy 5. Noncompliance w/ meds 6. Dementia w/o behavioral disturbances 7. Predm,   pn 08/18/15 1. Equivalent angina 2. AScvd, cardiac cath  1/11 revealed mild ostial stenosis of LAD, mild anterior wall hypokinesia, ef 55% 3. hld 4. Carotid art stenosis,  5. Localized edema, mainly right leg from prior injury.

## 2016-11-29 ENCOUNTER — Telehealth: Payer: Self-pay

## 2016-11-29 NOTE — Telephone Encounter (Signed)
Contacted pt to go over lab results spoke with patient son and went over results pt is aware and doesn't have any questions or concerns

## 2016-12-02 ENCOUNTER — Ambulatory Visit: Payer: Medicare Other | Admitting: Podiatry

## 2016-12-03 ENCOUNTER — Ambulatory Visit: Payer: Medicare Other | Admitting: Sports Medicine

## 2016-12-13 ENCOUNTER — Ambulatory Visit: Payer: Medicare Other | Admitting: Diagnostic Neuroimaging

## 2016-12-13 ENCOUNTER — Emergency Department (HOSPITAL_COMMUNITY)
Admission: EM | Admit: 2016-12-13 | Discharge: 2016-12-13 | Disposition: A | Discharge status: Home / Self Care | Payer: Medicare Other | Attending: Emergency Medicine | Admitting: Emergency Medicine

## 2016-12-13 ENCOUNTER — Encounter (HOSPITAL_COMMUNITY): Payer: Self-pay | Admitting: Emergency Medicine

## 2016-12-13 DIAGNOSIS — Z79899 Other long term (current) drug therapy: Secondary | ICD-10-CM | POA: Diagnosis not present

## 2016-12-13 DIAGNOSIS — Z7982 Long term (current) use of aspirin: Secondary | ICD-10-CM | POA: Diagnosis not present

## 2016-12-13 DIAGNOSIS — I10 Essential (primary) hypertension: Secondary | ICD-10-CM | POA: Diagnosis not present

## 2016-12-13 DIAGNOSIS — F0390 Unspecified dementia without behavioral disturbance: Secondary | ICD-10-CM | POA: Diagnosis present

## 2016-12-13 DIAGNOSIS — F0391 Unspecified dementia with behavioral disturbance: Secondary | ICD-10-CM | POA: Insufficient documentation

## 2016-12-13 NOTE — ED Provider Notes (Signed)
[facility]-EMERGENCY DEPT Provider Note   CSN: [id number] Arrival date & time: 12/13/16  1530     History   Chief Complaint Chief Complaint  Patient presents with  . Dementia    HPI Scott Park is a 82 y.o. male.  HPI  Remainder of history, ROS, and physical exam limited due to patient's condition (demetia). Additional information was obtained from either EMS or family.   Level V Caveat.  Patient brought in by EMS for bizarre behavior. They report the patient lives in a apartment complex and has a history of dementia. Patient went to the complex office without pants on.   Currently waiting for family to arrive.  Family reported that the patient has been having worsening dementia over the past several months. He does live alone in apartment however the son tries to stay with the patient as much as he can. Patient travels and is away 2 weeks out of the month. Patient's daughter-in-law lives close by and checks up on him frequently. They deny any recent fevers, illnesses, or infections. They're requesting a social work Administrator, sportsconsultation.  History reviewed. No pertinent past medical history.  Patient Active Problem List   Diagnosis Date Noted  . HTN (hypertension), benign 11/25/2016  . Dementia with behavioral disturbance 11/25/2016  . Pedal edema 11/25/2016    History reviewed. No pertinent surgical history.     Home Medications    Prior to Admission medications   Medication Sig Start Date End Date Taking? Authorizing Provider  aspirin EC 81 MG tablet Take 81 mg by mouth daily.    Historical Provider, MD  atenolol (TENORMIN) 25 MG tablet Take 1 tablet (25 mg total) by mouth daily. 11/25/16   Pete Glatterawn T Langeland, MD  clonazePAM (KLONOPIN) 0.5 MG tablet Take 1 tablet (0.5 mg total) by mouth at bedtime as needed (FOR SLEEP). 10/14/16   Pete Glatterawn T Langeland, MD  ferrous sulfate (FERROUSUL) 325 (65 FE) MG tablet Take 1 tablet (325 mg total) by mouth daily with breakfast. 11/26/16   Pete Glatterawn T  Langeland, MD  QUEtiapine (SEROQUEL) 50 MG tablet Take 50 mg by mouth every 12 (twelve) hours. 08/15/16   Historical Provider, MD  ramipril (ALTACE) 10 MG capsule Take 1 capsule (10 mg total) by mouth daily. 11/25/16   Pete Glatterawn T Langeland, MD  saccharomyces boulardii (FLORASTOR) 250 MG capsule Take 1 capsule (250 mg total) by mouth 2 (two) times daily. Patient not taking: Reported on 11/25/2016 09/03/16   Pete Glatterawn T Langeland, MD  senna-docusate (SENOKOT-S) 8.6-50 MG tablet Take 1 tablet by mouth at bedtime as needed for mild constipation. While on iron pills 11/26/16   Pete Glatterawn T Langeland, MD  sertraline (ZOLOFT) 100 MG tablet Take 1 tablet (100 mg total) by mouth daily. 11/25/16   Pete Glatterawn T Langeland, MD  Vitamin D, Ergocalciferol, (DRISDOL) 50000 units CAPS capsule Take 1 capsule (50,000 Units total) by mouth every 7 (seven) days. 11/26/16   Pete Glatterawn T Langeland, MD    Family History No family history on file.  Social History Social History  Substance Use Topics  . Smoking status: Never Smoker  . Smokeless tobacco: Never Used  . Alcohol use Not on file     Allergies   Codeine   Review of Systems Review of Systems  Unable to perform ROS: Dementia     Physical Exam Updated Vital Signs BP 174/65 (BP Location: Right Arm)   Pulse 72   Temp 98.4 F (36.9 C) (Oral)   Resp 18   SpO2 98%  Physical Exam  Constitutional: He appears well-developed and well-nourished. No distress.  HENT:  Head: Normocephalic and atraumatic.  Nose: Nose normal.  Eyes: Conjunctivae and EOM are normal. Pupils are equal, round, and reactive to light. Right eye exhibits no discharge. Left eye exhibits no discharge. No scleral icterus.  Neck: Normal range of motion. Neck supple.  Cardiovascular: Normal rate and regular rhythm.  Exam reveals no gallop and no friction rub.   No murmur heard. Pulmonary/Chest: Effort normal and breath sounds normal. No stridor. No respiratory distress. He has no rales.  Abdominal: Soft. He  exhibits no distension. There is no tenderness.  Musculoskeletal: He exhibits no edema or tenderness.  Neurological: He is alert. He is disoriented.  Pleasantly demented  Skin: Skin is warm and dry. No rash noted. He is not diaphoretic. No erythema.  Psychiatric: He has a normal mood and affect.  Vitals reviewed.    ED Treatments / Results  Labs (all labs ordered are listed, but only abnormal results are displayed) Labs Reviewed - No data to display  EKG  EKG Interpretation None       Radiology No results found.  Procedures Procedures (including critical care time)  Medications Ordered in ED Medications - No data to display   Initial Impression / Assessment and Plan / ED Course  I have reviewed the triage vital signs and the nursing notes.  Pertinent labs & imaging results that were available during my care of the patient were reviewed by me and considered in my medical decision making (see chart for details).  Clinical Course     Discussed case with the social worker who evaluated the patient in the emergency department and determined that the patient does not meet criteria for placement from the emergency department. Family was provided with resources.  The patient is safe for discharge with strict return precautions.   Final Clinical Impressions(s) / ED Diagnoses   Final diagnoses:  Dementia with behavioral disturbance, unspecified dementia type   Disposition: Discharge  Condition: Good  I have discussed the results, Dx and Tx plan with the patient's family who expressed understanding and agree(s) with the plan. Discharge instructions discussed at great length. The patient's family was given strict return precautions who verbalized understanding of the instructions. No further questions at time of discharge.    Discharge Medication List as of 12/13/2016  8:23 PM      Follow Up: Pete Glatterawn T Langeland, MD [address] [place]  [id number] [phone]  Schedule an appointment as soon as possible for a visit  For close follow up to assess for placement      Nira ConnPedro Eduardo Cardama, MD 12/15/16 0136

## 2016-12-13 NOTE — ED Notes (Signed)
ED Provider at bedside. 

## 2016-12-13 NOTE — ED Triage Notes (Signed)
Per EMS pt alert to self with hx of dementia. Son verbalizes dementia has gotten worse. Pt sent for evaluation per request of apartment complex persons verbalizing pt running to office nude.

## 2016-12-13 NOTE — ED Notes (Signed)
Dr. Eudelia Bunchardama speaking with family member about pt's status.

## 2016-12-13 NOTE — ED Notes (Signed)
Pt's son called and stated he is [place] and he will pick up pt when he gets into town.

## 2016-12-13 NOTE — Progress Notes (Signed)
CSW provided patients family members with list of assisted living facilities/ memory care.   Stacy GardnerErin Channing Savich, LCSWA Clinical Social Worker [phone]

## 2016-12-25 ENCOUNTER — Ambulatory Visit: Payer: Medicare Other | Admitting: Cardiovascular Disease

## 2016-12-26 ENCOUNTER — Encounter: Payer: Self-pay | Admitting: Podiatry

## 2016-12-26 ENCOUNTER — Ambulatory Visit (INDEPENDENT_AMBULATORY_CARE_PROVIDER_SITE_OTHER): Payer: Medicare Other | Admitting: Podiatry

## 2016-12-26 VITALS — BP 112/71 | HR 56 | Resp 16

## 2016-12-26 DIAGNOSIS — M79676 Pain in unspecified toe(s): Secondary | ICD-10-CM | POA: Diagnosis not present

## 2016-12-26 DIAGNOSIS — B351 Tinea unguium: Secondary | ICD-10-CM | POA: Diagnosis not present

## 2016-12-26 DIAGNOSIS — L97401 Non-pressure chronic ulcer of unspecified heel and midfoot limited to breakdown of skin: Secondary | ICD-10-CM

## 2016-12-26 DIAGNOSIS — M79605 Pain in left leg: Secondary | ICD-10-CM | POA: Diagnosis not present

## 2016-12-26 DIAGNOSIS — M79604 Pain in right leg: Secondary | ICD-10-CM

## 2016-12-26 NOTE — Progress Notes (Signed)
   Subjective:    Patient ID: Scott Park, male    DOB: 05/04/1933, 83 y.o.   MRN: [id number]  HPI patient presents with caregiver stating that she doesn't know how that he seemed to traumatized the bottom of his left heel and he's not complaining of pain but they were concerned about appearance and he also needs his toenails taken care of    Review of Systems     Objective:   Physical Exam Neurovascular status unchanged with patient found to have an approximate 2 cm x 2 cm superficial irritation of the left heel that may occur secondary to his excessive walking with Alzheimer's disease as a complicating factor. There is no proximal redness there is no drainage and there is no odor emitting from the area and he also has nail disease 1-5 both feet that are thick yellow brittle       Assessment & Plan:   Superficial ulceration left heel from excessive ambulation with patient who does have Alzheimer's and walks at all times along with nail disease 1-5 both feet area at this point I have recommended daily soaks for this area and we made him a surgical shoe taking pressure off the heel to try to reduce the stress that he is experiencing. I then went ahead and advised that if any proximal redness any swelling drainage or odor should occur he is to go straight to the emergency room and that there is a possibility that this could require amputation and may not heal. I debrided nailbeds 1-5 of both feet

## 2016-12-31 ENCOUNTER — Encounter: Payer: Self-pay | Admitting: Licensed Clinical Social Worker

## 2016-12-31 ENCOUNTER — Encounter: Payer: Self-pay | Admitting: Internal Medicine

## 2016-12-31 ENCOUNTER — Ambulatory Visit: Payer: Medicare Other | Attending: Internal Medicine | Admitting: Internal Medicine

## 2016-12-31 VITALS — BP 130/65 | HR 74 | Temp 98.2°F | Resp 16 | Wt 162.6 lb

## 2016-12-31 DIAGNOSIS — F0391 Unspecified dementia with behavioral disturbance: Secondary | ICD-10-CM | POA: Insufficient documentation

## 2016-12-31 MED ORDER — HYDROXYZINE HCL 10 MG PO TABS: 10.0000 mg | ORAL_TABLET | Freq: Three times a day (TID) | ORAL | 2 refills | Status: DC | PRN

## 2016-12-31 NOTE — Progress Notes (Signed)
Scott Park, is a 83 y.o. male  NGE:[id number]:[id number]  MWN:[id number]  DOB - 05/06/1933  Chief Complaint  Patient presents with  . paperwork        Subjective:   Scott Park is a 83 y.o. male here today for a follow up visit.  Son and ex-dgt-in-law here today as well. Son now staying w/ pt most days. Pt's dementia has gotten worse, esp w/ sundowning at night, walking nonstop around the house. He was on clonazepam in past, but not   dw son and ex-dil, they have not given him any seroquel, prescribed prior to move. I would recd they start it, since it may help calm him down considerably.    Pt denies complaints, ros limited to ros  No problems updated.  ALLERGIES: Allergies  Allergen Reactions  . Codeine Anxiety    PAST MEDICAL HISTORY: No past medical history on file.  MEDICATIONS AT HOME: Prior to Admission medications   Medication Sig Start Date End Date Taking? Authorizing Provider  aspirin EC 81 MG tablet Take 81 mg by mouth daily.    Historical Provider, MD  atenolol (TENORMIN) 25 MG tablet Take 1 tablet (25 mg total) by mouth daily. 11/25/16   Pete Glatterawn T Sanela Evola, MD  clonazePAM (KLONOPIN) 0.5 MG tablet Take 1 tablet (0.5 mg total) by mouth at bedtime as needed (FOR SLEEP). 10/14/16   Pete Glatterawn T Alylah Blakney, MD  ferrous sulfate (FERROUSUL) 325 (65 FE) MG tablet Take 1 tablet (325 mg total) by mouth daily with breakfast. 11/26/16   Pete Glatterawn T Ramsay Bognar, MD  hydrOXYzine (ATARAX/VISTARIL) 10 MG tablet Take 1 tablet (10 mg total) by mouth 3 (three) times daily as needed for anxiety (agitation). 12/31/16   Pete Glatterawn T Elisia Stepp, MD  QUEtiapine (SEROQUEL) 50 MG tablet Take 50 mg by mouth every 12 (twelve) hours. 08/15/16   Historical Provider, MD  ramipril (ALTACE) 10 MG capsule Take 1 capsule (10 mg total) by mouth daily. 11/25/16   Pete Glatterawn T Edric Fetterman, MD  saccharomyces boulardii (FLORASTOR) 250 MG capsule Take 1 capsule (250 mg total) by mouth 2 (two) times daily. 09/03/16   Pete Glatterawn T Tyshauna Finkbiner, MD    senna-docusate (SENOKOT-S) 8.6-50 MG tablet Take 1 tablet by mouth at bedtime as needed for mild constipation. While on iron pills 11/26/16   Pete Glatterawn T Jonice Cerra, MD  sertraline (ZOLOFT) 100 MG tablet Take 1 tablet (100 mg total) by mouth daily. 11/25/16   Pete Glatterawn T Bates Collington, MD  Vitamin D, Ergocalciferol, (DRISDOL) 50000 units CAPS capsule Take 1 capsule (50,000 Units total) by mouth every 7 (seven) days. Patient not taking: Reported on 12/31/2016 11/26/16   Pete Glatterawn T Shemar Plemmons, MD     Objective:   Vitals:   12/31/16 1611  BP: 130/65  Pulse: 74  Resp: 16  Temp: 98.2 F (36.8 C)  TempSrc: Oral  SpO2: 98%  Weight: 162 lb 9.6 oz (73.8 kg)    Exam General appearance : Awake, alert, not in any distress. Speech Clear. Not toxic looking HEENT: Atraumatic and Normocephalic, pupils equally reactive to light. LE; Trace bilat le edema persist. Neurology: Awake alert, and oriented to self. Skin:No Rash  Data Review No results found for: HGBA1C  Depression screen [facility] 12/31/2016 11/25/2016 09/03/2016  Decreased Interest 2 0 0  Down, Depressed, Hopeless (No Data) 0 0  PHQ - 2 Score 2 0 0  Altered sleeping (No Data) - -  Tired, decreased energy 3 - -  Change in appetite 3 - -  Feeling bad or  failure about yourself  0 - -  Trouble concentrating 3 - -  Moving slowly or fidgety/restless 3 - -  Suicidal thoughts 2 - -      Assessment & Plan   1. Dementia w/ pychosis - recent er visit on 12/22 for delirium/agitation. - dw w/ son /ex-dil at length, would recd starting seroquel 50bid, they have not been giving it to him - trial hydroxyzine for agitation - will fill out ppwk for day-care center - would recd consideration of [facility] for closer supervision - cs SW/Jasmine for for assistance, appreciate help - recd keep Neuro appt on 01/16/16 as well.  Patient have been counseled extensively about nutrition and exercise  Return in about 4 weeks (around 01/28/2017).  The patient was given  clear instructions to go to ER or return to medical center if symptoms don't improve, worsen or new problems develop. The patient verbalized understanding. The patient was told to call to get lab results if they haven't heard anything in the next week.   This note has been created with Education officer, environmental. Any transcriptional errors are unintentional.   Pete Glatter, MD, MBA/MHA [facility] [place], [place] [phone]   12/31/2016, 4:20 PM

## 2016-12-31 NOTE — BH Specialist Note (Signed)
Session Start time: 4:40 PM   End Time: 5:00 PM Total Time:  20 minutes Type of Service: Behavioral Health - Individual/Family Interpreter: No.   Interpreter Name & Language: N/A # [facility] July 2017-June 2018: [facility]st   SUBJECTIVE: Scott MainlandJohn Park is a 83 y.o. male  Pt. was referred by Dr. Illene LabradorLangland for:  anxiety and community resources. Pt. reports the following symptoms/concerns: Pt's son reported overwhelming feelings of anxiety, irritability, episodes of psychosis, and dementia  Duration of problem:  Ongoing Severity: moderately severe Previous treatment: None reported   OBJECTIVE: Mood: Anxious & Affect: Appropriate Risk of harm to self or others: Pt denies SI/HI Assessments administered: PHQ-9; GAD-7  LIFE CONTEXT:  Family & Social: Pt resides in an apartment. His adult son, Scott SpanielJon Park [phone] stays over when able. Pt is visited by his ex daughter in law who resides in the same complex School/ Work: Pt has retired from [employer] and [employer] Self-Care: Pt has difficulty sleeping. Pt's son reported sundowning during night due to dementia Life changes: Pt is having difficulty completing basic needs (bathing and dressing self) Family in need of services What is important to pt/family (values): Family   GOALS ADDRESSED:  Decrease symptoms of anxiety   INTERVENTIONS: Solution Focused, Strength-based and Supportive   ASSESSMENT:  Pt currently experiencing dementia with psychosis. He was accompanied by his adult son and ex daughter in law during visit. Pt's son reported overwhelming feelings of anxiety, irritability, episodes of psychosis, and dementia. Pt may benefit from medication management. LCSWA will provide requested information from pt's family on services for dementia, adult daycare, and in-home assistance.       PLAN: 1. F/U with behavioral health clinician: LCSWA will contact pt's son with requested information on services 2. Behavioral Health  meds: Seroquel, Klonopin, and Hydroxyzine 3. Behavioral recommendations: LCSWA recommends that pt complies with medication management 4. Referral: Brief Counseling/Psychotherapy, Publishing rights managerCommunity Resource and Supportive Counseling 5. From scale of 1-10, how likely are you to follow plan: 8/10   Scott LarssonJasmine D Park Scott Park, MSW, [facility]WA  Clinical Social Worker 01/01/17 2:18 PM  Warmhandoff:   Warm Hand Off Completed.

## 2017-01-01 ENCOUNTER — Other Ambulatory Visit: Payer: Self-pay | Admitting: Internal Medicine

## 2017-01-01 DIAGNOSIS — Z111 Encounter for screening for respiratory tuberculosis: Secondary | ICD-10-CM

## 2017-01-02 ENCOUNTER — Telehealth: Payer: Self-pay | Admitting: Internal Medicine

## 2017-01-02 NOTE — Telephone Encounter (Signed)
Contacted pt daughter in law and spoke with linda she stated pt had a tb skin last year in feb and [facility]. Pt daughter in law will be bring the paper up here to make a copy and to get paperwork

## 2017-01-02 NOTE — Telephone Encounter (Signed)
Pt. Daughter in law called stating she received a call from pt. Nurse regarding some paperwork for a TB test.  Please f/u

## 2017-01-03 ENCOUNTER — Telehealth: Payer: Self-pay | Admitting: Licensed Clinical Social Worker

## 2017-01-03 NOTE — Telephone Encounter (Signed)
LCSWA contacted pt's son, Scott Park, via telephone. LCSWA provided requested resources to assist pt with adult daycare and in-home assistance. Pt was appreciative for the assistance.

## 2017-01-07 ENCOUNTER — Other Ambulatory Visit: Payer: Self-pay

## 2017-01-13 ENCOUNTER — Telehealth: Payer: Self-pay | Admitting: Internal Medicine

## 2017-01-13 ENCOUNTER — Telehealth: Payer: Self-pay | Admitting: Cardiovascular Disease

## 2017-01-13 NOTE — Telephone Encounter (Signed)
error 

## 2017-01-14 ENCOUNTER — Ambulatory Visit (INDEPENDENT_AMBULATORY_CARE_PROVIDER_SITE_OTHER): Payer: Medicare Other | Admitting: Cardiovascular Disease

## 2017-01-14 ENCOUNTER — Encounter: Payer: Self-pay | Admitting: Cardiovascular Disease

## 2017-01-14 DIAGNOSIS — R600 Localized edema: Secondary | ICD-10-CM | POA: Diagnosis not present

## 2017-01-14 DIAGNOSIS — I10 Essential (primary) hypertension: Secondary | ICD-10-CM | POA: Diagnosis not present

## 2017-01-14 NOTE — Assessment & Plan Note (Signed)
History of hypertension blood pressure measures 140/58. He is on enalapril and atenolol. Continue current meds (

## 2017-01-14 NOTE — Progress Notes (Signed)
01/14/2017 Scott Park   11/08/1933  [id number]  Primary Physician Dawn Marland Mcalpine Langeland, MD Primary Cardiologist: Runell GessJonathan J Eber Ferrufino MD Roseanne RenoFACP, FACC, FAHA, FSCAI  HPI:  Scott Park is a 83 year old thin and frail appearing widowed Caucasian male father of 2 children, grandfather and 3 grandchildren who is accompanied by his son and daughter-in-law. He [place] closer to family. He was seen at cardiologist down there. He is here to be reestablished. He does have a history of progressive dementia. He was the [employer] for 21 years and worked in the [employer] service as well. He never smoked but does have a history of hypertension. He has never had a heart attack or stroke. He denies chest pain or shortness of breath. He had traumatic injury to his right foot and apparently has had skin grafting and bypass surgery there. Also has an ulcer on the left heel and he was recently offloaded by Dr. Cristie HemNorman Regal .   Current Outpatient Prescriptions  Medication Sig Dispense Refill  . aspirin EC 81 MG tablet Take 81 mg by mouth daily.    Marland Kitchen. atenolol (TENORMIN) 25 MG tablet Take 1 tablet (25 mg total) by mouth daily. 90 tablet 3  . ferrous sulfate (FERROUSUL) 325 (65 FE) MG tablet Take 1 tablet (325 mg total) by mouth daily with breakfast. 90 tablet 3  . hydrOXYzine (ATARAX/VISTARIL) 10 MG tablet Take 1 tablet (10 mg total) by mouth 3 (three) times daily as needed for anxiety (agitation). 30 tablet 2  . QUEtiapine (SEROQUEL) 50 MG tablet Take 50 mg by mouth every 12 (twelve) hours.  5  . ramipril (ALTACE) 10 MG capsule Take 1 capsule (10 mg total) by mouth daily. 90 capsule 3  . sertraline (ZOLOFT) 100 MG tablet Take 1 tablet (100 mg total) by mouth daily. 90 tablet 3  . Vitamin D, Ergocalciferol, (DRISDOL) 50000 units CAPS capsule Take 1 capsule (50,000 Units total) by mouth every 7 (seven) days. 12 capsule 0   No current  facility-administered medications for this visit.     Allergies  Allergen Reactions  . Codeine Anxiety    Social History   Social History  . Marital status: Widowed    Spouse name: N/A  . Number of children: N/A  . Years of education: N/A   Occupational History  . Not on file.   Social History Main Topics  . Smoking status: Never Smoker  . Smokeless tobacco: Never Used  . Alcohol use Not on file  . Drug use: Unknown  . Sexual activity: Not on file   Other Topics Concern  . Not on file   Social History Narrative  . No narrative on file     Review of Systems: General: negative for chills, fever, night sweats or weight changes.  Cardiovascular: negative for chest pain, dyspnea on exertion, edema, orthopnea, palpitations, paroxysmal nocturnal dyspnea or shortness of breath Dermatological: negative for rash Respiratory: negative for cough or wheezing Urologic: negative for hematuria Abdominal: negative for nausea, vomiting, diarrhea, bright red blood per rectum, melena, or hematemesis Neurologic: negative for visual changes, syncope, or dizziness All other systems reviewed and are otherwise negative except as noted above.    Blood pressure (!) 140/58, pulse 65, height 5\' 11"  (1.803 m), weight 164 lb (74.4 kg).  General appearance: alert and no distress Neck: no adenopathy, no carotid bruit, no JVD, supple, symmetrical, trachea midline and thyroid not enlarged, symmetric, no  tenderness/mass/nodules Lungs: clear to auscultation bilaterally Heart: regular rate and rhythm, S1, S2 normal, no murmur, click, rub or gallop Extremities: extremities normal, atraumatic, no cyanosis or edema  EKG sinus rhythm at 65 without ST or T-wave changes. I personally reviewed this EKG  ASSESSMENT AND PLAN:   HTN (hypertension), benign History of hypertension blood pressure measures 140/58. He is on enalapril and atenolol. Continue current meds Runell Gess MD  [facility] 01/14/2017 9:55 AM

## 2017-01-14 NOTE — Patient Instructions (Signed)
Medication Instructions: Your physician recommends that you continue on your current medications as directed. Please refer to the Current Medication list given to you today.   Follow-Up: Your physician recommends that you schedule a follow-up appointment as needed with Dr. Berry.  If you need a refill on your cardiac medications before your next appointment, please call your pharmacy.  

## 2017-01-15 ENCOUNTER — Encounter: Payer: Self-pay | Admitting: Diagnostic Neuroimaging

## 2017-01-15 ENCOUNTER — Ambulatory Visit (INDEPENDENT_AMBULATORY_CARE_PROVIDER_SITE_OTHER): Payer: Medicare Other | Admitting: Diagnostic Neuroimaging

## 2017-01-15 ENCOUNTER — Telehealth: Payer: Self-pay | Admitting: Internal Medicine

## 2017-01-15 VITALS — BP 156/65 | HR 86 | Ht 70.0 in | Wt 167.0 lb

## 2017-01-15 DIAGNOSIS — R269 Unspecified abnormalities of gait and mobility: Secondary | ICD-10-CM

## 2017-01-15 DIAGNOSIS — F03B18 Unspecified dementia, moderate, with other behavioral disturbance: Secondary | ICD-10-CM

## 2017-01-15 DIAGNOSIS — F0391 Unspecified dementia with behavioral disturbance: Secondary | ICD-10-CM | POA: Diagnosis not present

## 2017-01-15 NOTE — Telephone Encounter (Signed)
rx for dme/ potty chair and shower chair for debility and fall risk, +dementia  Please call for them to pick up rx to take to medical supply store.

## 2017-01-15 NOTE — Telephone Encounter (Signed)
Patient's family member is asking if it would be possible for him to get a prescription for a potty care to assist when using the restroom...please follow up

## 2017-01-15 NOTE — Telephone Encounter (Signed)
Contacted pt and made aware that rx for shower and potty chair was ready for pick up

## 2017-01-15 NOTE — Progress Notes (Addendum)
[facility]  PATIENT: Scott Park DOB: 07/18/1933  REFERRING CLINICIAN: Julien NordmannLangeland, D HISTORY FROM: patient  REASON FOR VISIT: new consult    HISTORICAL  CHIEF COMPLAINT:  Chief Complaint  Patient presents with  . Cognitive decline    rm 7, New Pt, former dgtr-in-law Bonita QuinLinda, MMSE 14    HISTORY OF PRESENT ILLNESS:   83 year old male here for evaluation of memory loss. Patient arrives here with his former daughter-in-law. [place] when he started to have memory and cognitive decline around 2016. At that time patient's family realized that there was a "problem" and he started getting evaluated by physicians. He saw a neurologist and was diagnosed with dementia. He was started on donepezil. Patient was having problems with short-term memory loss, confusion, not being able to take his medications properly, missing appointments, decline in personal hygiene.  [place] September 2017 [place] Patient lives with his son and one apartment and his ex-daughter-in-law lives in the same apartment complex in a different unit. Patient's son's daughter is in college in [place] and therefore prompted the family to move closer. Patient's son is on the road for work at times and patient's ex-daughter-in-law comes in to check in 5 times per day for his health and well-being.  He is no longer able to properly take care of his personal hygiene, bathroom in, toileting, bathing, preparing meals, driving, household chores. He needs to have almost 24 hour supervision. He has tried to wander and leave the home multiple times. Family installed web cameras to help prevent this. Patient has had several episodes of severe delirium requiring emergency room visits and interventions.  Patient has been on clonazepam for several years for anxiety problems. This [place]. Patient was  tried on Seroquel twice for mood stabilization and agitation, but patient had paradoxical effect and became increasingly agitated on this medication. Patient on sertraline which seems to be helping with mood stabilization. At last emergency room visit, Behavioral Health clinical social worker advised further follow-up with psychiatry for medication management.  Today patient is calm, disheveled, with poor personal hygiene. He is quite tangential in conversation. He has pleasant affect and is cooperative. He has very poor insight into the state of his condition.   REVIEW OF SYSTEMS: Full 14 system review of systems performed and negative with exception of: Fatigue incontinence allergies hallucinations depression anxiety memory loss confusion.   ALLERGIES: Allergies  Allergen Reactions  . Codeine Anxiety    HOME MEDICATIONS: Outpatient Medications Prior to Visit  Medication Sig Dispense Refill  . aspirin EC 81 MG tablet Take 81 mg by mouth daily.    Marland Kitchen. atenolol (TENORMIN) 25 MG tablet Take 1 tablet (25 mg total) by mouth daily. 90 tablet 3  . ferrous sulfate (FERROUSUL) 325 (65 FE) MG tablet Take 1 tablet (325 mg total) by mouth daily with breakfast. 90 tablet 3  . QUEtiapine (SEROQUEL) 50 MG tablet Take 50 mg by mouth every 12 (twelve) hours.  5  . ramipril (ALTACE) 10 MG capsule Take 1 capsule (10 mg total) by mouth daily. 90 capsule 3  . sertraline (ZOLOFT) 100 MG tablet Take 1 tablet (100 mg total) by mouth daily. 90 tablet 3  . Vitamin D, Ergocalciferol, (DRISDOL) 50000 units CAPS capsule Take 1 capsule (50,000 Units total) by mouth every 7 (seven) days. 12 capsule 0  . hydrOXYzine (ATARAX/VISTARIL) 10 MG tablet Take 1 tablet (10  mg total) by mouth 3 (three) times daily as needed for anxiety (agitation). (Patient not taking: Reported on 01/15/2017) 30 tablet 2   No facility-administered medications prior to visit.     PAST MEDICAL HISTORY: Past Medical History:  Diagnosis Date  .  Hypertension   . SunDown syndrome     PAST SURGICAL HISTORY: Past Surgical History:  Procedure Laterality Date  . BYPASS GRAFT Right    artery bypass  . TIBIA FRACTURE SURGERY     R shin    FAMILY HISTORY: Family History  Problem Relation Age of Onset  . Cancer Mother   . Cancer Father     prostate    SOCIAL HISTORY:  Social History   Social History  . Marital status: Widowed    Spouse name: N/A  . Number of children: 2  . Years of education: 12   Occupational History  .      career Company secretary   Social History Main Topics  . Smoking status: Never Smoker  . Smokeless tobacco: Never Used  . 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 Drug use: No  . Sexual activity: Not on file   Other Topics Concern  . Not on file   Social History Narrative   Son lives with patient sometimes   Caffeine- none     PHYSICAL EXAM  GENERAL EXAM/CONSTITUTIONAL: Vitals:  Vitals:   01/15/17 1449  BP: (!) 156/65  Pulse: 86  Weight: 167 lb (75.8 kg)  Height: 5\' 10"  (1.778 m)     Body mass index is 23.96 kg/m.  Vision Screening Comments: 01/15/17 unable to complete  Patient is in no distress; well developed, nourished and groomed; neck is supple  DISHEVELED  STAINS ON CLOTHING; FECES ON SHOES  CARDIOVASCULAR:  Examination of carotid arteries is normal; no carotid bruits  Regular rate and rhythm, no murmurs  Examination of peripheral vascular system by observation and palpation is normal  EYES:  Ophthalmoscopic exam of optic discs and posterior segments is normal; no papilledema or hemorrhages  MUSCULOSKELETAL:  Gait, strength, tone, movements noted in Neurologic exam below  NEUROLOGIC: MENTAL STATUS:  MMSE - Mini Mental State Exam 01/15/2017  Orientation to time 0  Orientation to Place 1  Registration 3  Attention/ Calculation 4  Recall 0  Language- name 2 objects 2  Language- repeat 1  Language- follow 3 step command 1  Language-  follow 3 step command-comments did not fold or drop on floor  Language- read & follow direction 1  Write a sentence 1  Copy design 0  Total score 14    awake, alert, oriented to person; NOT ORIENTED TO PLACE OR TIME  DECR recent memory  DECR attention and concentration  DECR FLUENCY; DECR COMPREHENSION, naming intact  fund of knowledge appropriate  TANGENTIAL  POOR INSIGHT  CRANIAL NERVE:   2nd - no papilledema on fundoscopic exam  2nd, 3rd, 4th, 6th - pupils equal and reactive to light, visual fields full to confrontation, extraocular muscles intact, no nystagmus  5th - facial sensation symmetric  7th - facial strength symmetric  8th - hearing intact  9th - palate elevates symmetrically, uvula midline  11th - shoulder shrug symmetric  12th - tongue protrusion midline  HOARSE VOICE    MOTOR:   normal bulk and tone, 4+ STRENGTH in the BUE, BLE  SENSORY:   normal and symmetric to light touch  DECR VIB AND TEMP IN FEET  COORDINATION:   finger-nose-finger, fine finger movements SLOW  MOTOR APRAXIA  REFLEXES:   deep tendon reflexes TRACE and symmetric  GAIT/STATION:   narrow based gait; SLOW AND CAUTIOUS    DIAGNOSTIC DATA (LABS, IMAGING, TESTING) - I reviewed patient records, labs, notes, testing and imaging myself where available.  Lab Results  Component Value Date   WBC 9.6 11/25/2016   HGB 11.7 (L) 11/25/2016   HCT 38.6 11/25/2016   MCV 83.5 11/25/2016   PLT 287 11/25/2016      Component Value Date/Time   NA 142 11/25/2016 1508   K 4.2 11/25/2016 1508   CL 102 11/25/2016 1508   CO2 32 (H) 11/25/2016 1508   GLUCOSE 102 (H) 11/25/2016 1508   BUN 18 11/25/2016 1508   CREATININE 0.82 11/25/2016 1508   CALCIUM 9.4 11/25/2016 1508   GFRNONAA 83 11/25/2016 1508   GFRAA >89 11/25/2016 1508   No results found for: CHOL, HDL, LDLCALC, LDLDIRECT, TRIG, CHOLHDL No results found for: ZOXW9U Lab Results  Component Value Date    VITAMINB12 335 11/25/2016   No results found for: TSH   04/02/16 MRI BRAIN [scan from [place] on disc; I reviewed images myself and reported findings below. -VRP]  - moderate periysylvian atrophy - mild chronic small vessel ischemic disease  - cavum septum pellucidum    ASSESSMENT AND PLAN  83 y.o. year old male here with moderate dementia with behavior changes. Significant decline in personal ADLs, cognitive ability, with intermittent mood swings, agitation and delirium.   Dx:  1. Moderate dementia with behavioral disturbance   2. Gait difficulty      PLAN:  Dementia management - continue donepezil; may consider adding on mementine (but of limited benefit) - gave community resources for patient and family to review - consider palliative care consult  Options for agitation and neuropsychiatric symptoms: - non-pharmacaologic techniques reviewed ("implementation of activities, music therapy, sensory interventions such as massage, and person-centered communication skills training for caregivers. Identifying any preceding events that generate agitation, determining whether unmet needs can be anticipated and alleviated, and avoiding environmental triggers such as a sudden change in surroundings")  - continue sertraline for mood stabilization - consider risperdal or haldol for severe agitation / psychosis (caution due to paradoxical effect with seroquel in the past) - consider nuedexta for pseudobulbar affect and agitation - consider depakote for mood stabilization - follow up with PCP; may consider referral to geriatric psychiatry  Return if symptoms worsen or fail to improve, for return to PCP.    Suanne Marker, MD 01/15/2017, 3:26 PM Certified in Neurology, Neurophysiology and Neuroimaging  [facility] [address], [address] [place], [place] [address] [phone]

## 2017-01-15 NOTE — Telephone Encounter (Signed)
Will forward to pcp

## 2017-01-24 ENCOUNTER — Ambulatory Visit (INDEPENDENT_AMBULATORY_CARE_PROVIDER_SITE_OTHER): Payer: Medicare Other | Admitting: Podiatry

## 2017-01-24 ENCOUNTER — Other Ambulatory Visit: Payer: Self-pay | Admitting: *Deleted

## 2017-01-24 DIAGNOSIS — L03119 Cellulitis of unspecified part of limb: Secondary | ICD-10-CM

## 2017-01-24 DIAGNOSIS — L02619 Cutaneous abscess of unspecified foot: Secondary | ICD-10-CM

## 2017-01-24 DIAGNOSIS — L97401 Non-pressure chronic ulcer of unspecified heel and midfoot limited to breakdown of skin: Secondary | ICD-10-CM | POA: Diagnosis not present

## 2017-01-24 DIAGNOSIS — L97501 Non-pressure chronic ulcer of other part of unspecified foot limited to breakdown of skin: Secondary | ICD-10-CM

## 2017-01-24 MED ORDER — CEPHALEXIN 500 MG PO CAPS: 500.0000 mg | ORAL_CAPSULE | Freq: Three times a day (TID) | ORAL | 1 refills | Status: DC

## 2017-01-24 NOTE — Progress Notes (Signed)
Subjective:     Patient ID: Scott Park, male   DOB: 01/18/1933, 83 y.o.   MRN: [id number]  HPI patient presents with caregiver in a moderately confused state with redness in his right foot and continued chronic ulceration left plantar heel that when he is off of it improves but his caregiver admits that he is always pacing any re-irritates the area   Review of Systems     Objective:   Physical Exam Neurovascular unchanged with patient found to have distal keratotic lesion right hallux that's not showing current drainage but there is redness in the foot extending to the ankle with patient having also severe leg disease and probable vascular disease. Left heel shows a 3 x 3 cm heel ulceration with subcutaneous exposure with no bone or tendon exposure when probed    Assessment:     Chronic ulceration that we have not been able to get better despite conservative care    Plan:     Reviewed conditions and I do think ultimately that this is been to take more intensive type treatment and I've recommended him to be referred to the wound care center which we will do today. As precautionary measure I did place him on antibiotic for his right foot cephalexin 500 mg 3 times a day and I gave his caregiver strict instructions if he should develop any systemic signs of infection or any changes to go straight to the emergency room but at this point this does not appear to be cellulitis or septic I am just using this as a precaution but due to his poor health status and his other issues their does need to be vigilance and watching him with the possibility that there could be infection and ultimately this patient may require amputation

## 2017-01-28 ENCOUNTER — Encounter (HOSPITAL_COMMUNITY): Payer: Self-pay | Admitting: Emergency Medicine

## 2017-01-28 ENCOUNTER — Ambulatory Visit: Payer: Medicare Other | Attending: Internal Medicine | Admitting: Internal Medicine

## 2017-01-28 ENCOUNTER — Inpatient Hospital Stay (HOSPITAL_COMMUNITY)
Admission: EM | Admit: 2017-01-28 | Discharge: 2017-02-02 | DRG: 603 | Disposition: A | Discharge status: Skilled Nursing Facility | Payer: Medicare Other | Attending: Internal Medicine | Admitting: Internal Medicine

## 2017-01-28 ENCOUNTER — Encounter: Payer: Self-pay | Admitting: Internal Medicine

## 2017-01-28 VITALS — BP 133/39 | HR 76 | Temp 97.6°F | Resp 16 | Wt 175.0 lb

## 2017-01-28 DIAGNOSIS — S91302A Unspecified open wound, left foot, initial encounter: Secondary | ICD-10-CM | POA: Diagnosis present

## 2017-01-28 DIAGNOSIS — L03115 Cellulitis of right lower limb: Secondary | ICD-10-CM | POA: Diagnosis not present

## 2017-01-28 DIAGNOSIS — A419 Sepsis, unspecified organism: Secondary | ICD-10-CM | POA: Diagnosis not present

## 2017-01-28 DIAGNOSIS — Z885 Allergy status to narcotic agent status: Secondary | ICD-10-CM

## 2017-01-28 DIAGNOSIS — L97929 Non-pressure chronic ulcer of unspecified part of left lower leg with unspecified severity: Secondary | ICD-10-CM | POA: Insufficient documentation

## 2017-01-28 DIAGNOSIS — T814XXA Infection following a procedure, initial encounter: Secondary | ICD-10-CM

## 2017-01-28 DIAGNOSIS — S81801A Unspecified open wound, right lower leg, initial encounter: Secondary | ICD-10-CM | POA: Insufficient documentation

## 2017-01-28 DIAGNOSIS — I10 Essential (primary) hypertension: Secondary | ICD-10-CM | POA: Diagnosis not present

## 2017-01-28 DIAGNOSIS — E876 Hypokalemia: Secondary | ICD-10-CM | POA: Diagnosis present

## 2017-01-28 DIAGNOSIS — Z95820 Peripheral vascular angioplasty status with implants and grafts: Secondary | ICD-10-CM

## 2017-01-28 DIAGNOSIS — N179 Acute kidney failure, unspecified: Secondary | ICD-10-CM | POA: Diagnosis present

## 2017-01-28 DIAGNOSIS — F0391 Unspecified dementia with behavioral disturbance: Secondary | ICD-10-CM | POA: Diagnosis present

## 2017-01-28 DIAGNOSIS — Z79899 Other long term (current) drug therapy: Secondary | ICD-10-CM

## 2017-01-28 DIAGNOSIS — L97509 Non-pressure chronic ulcer of other part of unspecified foot with unspecified severity: Secondary | ICD-10-CM

## 2017-01-28 DIAGNOSIS — E860 Dehydration: Secondary | ICD-10-CM | POA: Diagnosis present

## 2017-01-28 DIAGNOSIS — Z888 Allergy status to other drugs, medicaments and biological substances status: Secondary | ICD-10-CM

## 2017-01-28 DIAGNOSIS — F03918 Unspecified dementia, unspecified severity, with other behavioral disturbance: Secondary | ICD-10-CM | POA: Diagnosis present

## 2017-01-28 DIAGNOSIS — D649 Anemia, unspecified: Secondary | ICD-10-CM | POA: Diagnosis present

## 2017-01-28 DIAGNOSIS — Z7982 Long term (current) use of aspirin: Secondary | ICD-10-CM

## 2017-01-28 DIAGNOSIS — N1330 Unspecified hydronephrosis: Secondary | ICD-10-CM | POA: Diagnosis present

## 2017-01-28 DIAGNOSIS — N390 Urinary tract infection, site not specified: Secondary | ICD-10-CM | POA: Diagnosis present

## 2017-01-28 DIAGNOSIS — N320 Bladder-neck obstruction: Secondary | ICD-10-CM | POA: Diagnosis present

## 2017-01-28 DIAGNOSIS — L8990 Pressure ulcer of unspecified site, unspecified stage: Secondary | ICD-10-CM | POA: Diagnosis present

## 2017-01-28 DIAGNOSIS — IMO0001 Reserved for inherently not codable concepts without codable children: Secondary | ICD-10-CM

## 2017-01-28 DIAGNOSIS — Z89429 Acquired absence of other toe(s), unspecified side: Secondary | ICD-10-CM

## 2017-01-28 LAB — CBC WITH DIFFERENTIAL/PLATELET
Basophils Absolute: 0 10*3/uL (ref 0.0–0.1)
Basophils Relative: 0 %
Eosinophils Absolute: 0.2 10*3/uL (ref 0.0–0.7)
Eosinophils Relative: 2 %
HCT: 29.5 % — ABNORMAL LOW (ref 39.0–52.0)
Hemoglobin: 8.8 g/dL — ABNORMAL LOW (ref 13.0–17.0)
Lymphocytes Relative: 17 %
Lymphs Abs: 1.6 10*3/uL (ref 0.7–4.0)
MCH: 23.8 pg — ABNORMAL LOW (ref 26.0–34.0)
MCHC: 29.8 g/dL — ABNORMAL LOW (ref 30.0–36.0)
MCV: 79.7 fL (ref 78.0–100.0)
Monocytes Absolute: 0.7 10*3/uL (ref 0.1–1.0)
Monocytes Relative: 8 %
Neutro Abs: 6.9 10*3/uL (ref 1.7–7.7)
Neutrophils Relative %: 73 %
Platelets: 378 10*3/uL (ref 150–400)
RBC: 3.7 MIL/uL — ABNORMAL LOW (ref 4.22–5.81)
RDW: 14.8 % (ref 11.5–15.5)
WBC: 9.4 10*3/uL (ref 4.0–10.5)

## 2017-01-28 LAB — COMPREHENSIVE METABOLIC PANEL
ALT: 11 U/L — ABNORMAL LOW (ref 17–63)
AST: 21 U/L (ref 15–41)
Albumin: 3 g/dL — ABNORMAL LOW (ref 3.5–5.0)
Alkaline Phosphatase: 45 U/L (ref 38–126)
Anion gap: 7 (ref 5–15)
BUN: 16 mg/dL (ref 6–20)
CO2: 29 mmol/L (ref 22–32)
Calcium: 8.7 mg/dL — ABNORMAL LOW (ref 8.9–10.3)
Chloride: 109 mmol/L (ref 101–111)
Creatinine, Ser: 1.65 mg/dL — ABNORMAL HIGH (ref 0.61–1.24)
GFR calc Af Amer: 43 mL/min — ABNORMAL LOW (ref >60)
GFR calc non Af Amer: 37 mL/min — ABNORMAL LOW (ref >60)
Glucose, Bld: 95 mg/dL (ref 65–99)
Potassium: 3.5 mmol/L (ref 3.5–5.1)
Sodium: 145 mmol/L (ref 135–145)
Total Bilirubin: 0.5 mg/dL (ref 0.3–1.2)
Total Protein: 6.2 g/dL — ABNORMAL LOW (ref 6.5–8.1)

## 2017-01-28 LAB — I-STAT CG4 LACTIC ACID, ED
Lactic Acid, Venous: 0.82 mmol/L (ref 0.5–1.9)
Lactic Acid, Venous: 1.15 mmol/L (ref 0.5–1.9)

## 2017-01-28 MED ORDER — PIPERACILLIN-TAZOBACTAM 3.375 G IVPB 30 MIN
3.3750 g | Freq: Once | INTRAVENOUS | Status: AC
  Administered 2017-01-29: 3.375 g via INTRAVENOUS
  Filled 2017-01-28: qty 50

## 2017-01-28 MED ORDER — VANCOMYCIN HCL 10 G IV SOLR
1500.0000 mg | Freq: Once | INTRAVENOUS | Status: AC
  Administered 2017-01-29: 1500 mg via INTRAVENOUS
  Filled 2017-01-28: qty 1500

## 2017-01-28 MED ORDER — HALOPERIDOL LACTATE 5 MG/ML IJ SOLN
5.0000 mg | Freq: Once | INTRAMUSCULAR | Status: AC
  Administered 2017-01-28: 5 mg via INTRAMUSCULAR
  Filled 2017-01-28: qty 1

## 2017-01-28 MED ORDER — SODIUM CHLORIDE 0.9 % IV BOLUS (SEPSIS)
1000.0000 mL | Freq: Once | INTRAVENOUS | Status: AC
  Administered 2017-01-29: 1000 mL via INTRAVENOUS

## 2017-01-28 NOTE — ED Provider Notes (Signed)
[facility]-EMERGENCY DEPT Provider Note   CSN: [id number] Arrival date & time: 01/28/17  1508     History   Chief Complaint Chief Complaint  Patient presents with  . Wound Infection    HPI Scott Park is a 83 y.o. male.  HPI Hx limited by pt dementia, taken from [place], caretaker and ex-daughter-in-law over the phone  83yo had fx, graft, had artery placed, half a toe removed Increasing pain in right foot Redness to top of right foot for one week Severe pain per ex-daughter in law Wound comes and goes Took him to podiatrist last Thursday and gave him keflex, but redness persisted and increasing pain Dr. Charlsie Merlesegal is podiatrist Hx of dementia, sundowning, walks, does not know what day it is, at times he is confused of location  Saw PCP today and recommended he come to ED for r/o sepsis, eval of foot wound  Takes sertraline but has been off for 3 weeks and improved Clonazepam and seroquel puts him in twilight state  [place]ives at home[place] [facility] [place].   Most recent stool was brown as far as daughter knows  Scott QuinLinda ex-daughter in law [phone] Scott Park son is out of town [phone]  Past Medical History:  Diagnosis Date  . Hypertension   . SunDown syndrome     Patient Active Problem List   Diagnosis Date Noted  . Cellulitis of right foot 01/29/2017  . Normocytic anemia 01/29/2017  . AKI (acute kidney injury) (HCC) 01/29/2017  . Open wound of left heel 01/29/2017  . HTN (hypertension), benign 11/25/2016  . Dementia with behavioral disturbance 11/25/2016  . Pedal edema 11/25/2016    Past Surgical History:  Procedure Laterality Date  . BYPASS GRAFT Right    artery bypass  . TIBIA FRACTURE SURGERY     R shin       Home Medications    Prior to Admission medications   Medication Sig Start Date End Date Taking? Authorizing Provider  aspirin EC 81 MG tablet Take 81 mg by mouth daily.   Yes Historical Provider, MD  atenolol (TENORMIN) 25 MG tablet Take  1 tablet (25 mg total) by mouth daily. 11/25/16  Yes Dawn Marland Mcalpine Langeland, MD  bisacodyl (DULCOLAX) 5 MG EC tablet Take 5 mg by mouth daily as needed for moderate constipation.   Yes Historical Provider, MD  cephALEXin (KEFLEX) 500 MG capsule Take 1 capsule (500 mg total) by mouth 3 (three) times daily. 01/24/17  Yes Lenn SinkNorman S Regal, DPM    Family History Family History  Problem Relation Age of Onset  . Cancer Mother   . Cancer Father     prostate    Social History Social History  Substance Use Topics  . Smoking status: Never Smoker  . Smokeless tobacco: Never Used  . 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     Physical Exam Updated Vital Signs BP 182/69   Pulse 78   Temp 97.9 F (36.6 C)   Resp 18   Ht 5\' 10"  (1.778 m)   Wt 175 lb (79.4 kg)   SpO2 97%   BMI 25.11 kg/m   Physical Exam  Constitutional: He appears well-developed and well-nourished. No distress.  HENT:  Head: Normocephalic and atraumatic.  Eyes: Conjunctivae and EOM  are normal.  Neck: Normal range of motion.  Cardiovascular: Normal rate, regular rhythm, normal heart sounds and intact distal pulses.  Exam reveals no gallop and no friction rub.   No murmur heard. Pulmonary/Chest: Effort normal and breath sounds normal. No respiratory distress. He has no wheezes. He has no rales.  Abdominal: Soft. He exhibits no distension. There is no tenderness. There is no guarding.  Musculoskeletal:  Remote post-surgical changes/deformity/skin graft, great toe nail absent, erythema of toes and dorsum of right foot Left foot heel ulcer  Neurological: He is alert.  Skin: Skin is warm and dry. He is not diaphoretic. There is erythema (right foot).  Nursing note and vitals reviewed.    ED Treatments / Results  Labs (all labs ordered  are listed, but only abnormal results are displayed) Labs Reviewed  COMPREHENSIVE METABOLIC PANEL - Abnormal; Notable for the following:       Result Value   Creatinine, Ser 1.65 (*)    Calcium 8.7 (*)    Total Protein 6.2 (*)    Albumin 3.0 (*)    ALT 11 (*)    GFR calc non Af Amer 37 (*)    GFR calc Af Amer 43 (*)    All other components within normal limits  CBC WITH DIFFERENTIAL/PLATELET - Abnormal; Notable for the following:    RBC 3.70 (*)    Hemoglobin 8.8 (*)    HCT 29.5 (*)    MCH 23.8 (*)    MCHC 29.8 (*)    All other components within normal limits  URINALYSIS, ROUTINE W REFLEX MICROSCOPIC - Abnormal; Notable for the following:    Hgb urine dipstick SMALL (*)    All other components within normal limits  CULTURE, BLOOD (ROUTINE X 2)  CULTURE, BLOOD (ROUTINE X 2)  OCCULT BLOOD X 1 CARD TO LAB, STOOL  CREATININE, URINE, RANDOM  SODIUM, URINE, RANDOM  BASIC METABOLIC PANEL  CBC  SEDIMENTATION RATE  C-REACTIVE PROTEIN  I-STAT CG4 LACTIC ACID, ED  I-STAT CG4 LACTIC ACID, ED    EKG  EKG Interpretation None       Radiology No results found.  Procedures Procedures (including critical care time)  Medications Ordered in ED Medications  vancomycin (VANCOCIN) 1,500 mg in sodium chloride 0.9 % 500 mL IVPB (1,500 mg Intravenous New Bag/Given 01/29/17 0031)  0.9 %  sodium chloride infusion ( Intravenous New Bag/Given 01/29/17 0038)  bisacodyl (DULCOLAX) EC tablet 5 mg (not administered)  atenolol (TENORMIN) tablet 25 mg (not administered)  aspirin EC tablet 81 mg (not administered)  LORazepam (ATIVAN) injection 0.5 mg (0.5 mg Intravenous Given 01/29/17 0114)  enoxaparin (LOVENOX) injection 40 mg (not administered)  acetaminophen (TYLENOL) tablet 650 mg (not administered)    Or  acetaminophen (TYLENOL) suppository 650 mg (not administered)  HYDROcodone-acetaminophen (NORCO/VICODIN) 5-325 MG per tablet 1-2 tablet (not administered)  ondansetron (ZOFRAN) tablet 4 mg  (not administered)    Or  ondansetron (ZOFRAN) injection 4 mg (not administered)  zolpidem (AMBIEN) tablet 5 mg (not administered)  haloperidol lactate (HALDOL) injection 5 mg (5 mg Intramuscular Given 01/28/17 2047)  sodium chloride 0.9 % bolus 1,000 mL (1,000 mLs Intravenous New Bag/Given 01/29/17 0005)  piperacillin-tazobactam (ZOSYN) IVPB 3.375 g (3.375 g Intravenous New Bag/Given 01/29/17 0036)  sodium chloride 0.9 % bolus 1,000 mL (1,000 mLs Intravenous New Bag/Given 01/29/17 0118)     Initial Impression / Assessment and Plan / ED Course  I have reviewed the triage vital signs and the nursing notes.  Pertinent labs & imaging results that were available during my care of the patient were reviewed by me and considered in my medical decision making (see chart for details).     83yo male presents with concern for worsening right foot erythema after taking keflex for one week. Patient with a history of dementia and is unable to provide history, however history is taken from his ex-daughter-in-law who is his caretaker taker when his son is out of town. Patient was seen by his primary care physician who noted increasing erythema and had concern for possible sepsis as well as failure of outpatient antibiotics and was sent to the emergency department for further evaluation and treatment.  Patient has signs of cellulitis on exam, no sign of superficial abscess. No signs of sepsis.  Given failure of outpatient antibiotics, we will initiate being consistent, and admit patient for further evaluation. Do not feel emergent imaging is indicated at this time but would consider MR if pt does not show improvement.   Final Clinical Impressions(s) / ED Diagnoses   Final diagnoses:  Foot ulcer (HCC)  Cellulitis of right foot    New Prescriptions New Prescriptions   No medications on file     Alvira Monday, MD 01/29/17 0131

## 2017-01-28 NOTE — ED Notes (Signed)
Pt sundowning and becoming agitated. Pt states he wants to go home and doesn't want to stay here anymore.

## 2017-01-28 NOTE — Progress Notes (Signed)
Scott Park, is a 83 y.o. male  ION:[id number]:[id number]  KGM:[id number]  DOB - 05/25/1933  Chief Complaint  Patient presents with  . Open Wound        Subjective:   Scott MainlandJohn Plyler is a 83 y.o. male here today for a follow up visit for leg wounds. Were poorly healing, exacerbated by his dementia. He recently saw Podiatry 01/24/17, started on keflex and wound care cs was placed. Here for f/u.  Of note, pt's ex-dgt-in-law is w/ him today. Wound looks worse, especially on the right leg.  Has been taking abx w/o improvement.   Patient has No headache, No chest pain, No abdominal pain - No Nausea, No new weakness tingling or numbness, No Cough - SOB.  No problems updated.  ALLERGIES: Allergies  Allergen Reactions  . Codeine Anxiety    PAST MEDICAL HISTORY: Past Medical History:  Diagnosis Date  . Hypertension   . SunDown syndrome     MEDICATIONS AT HOME: Prior to Admission medications   Medication Sig Start Date End Date Taking? Authorizing Provider  aspirin EC 81 MG tablet Take 81 mg by mouth daily.    Historical Provider, MD  atenolol (TENORMIN) 25 MG tablet Take 1 tablet (25 mg total) by mouth daily. 11/25/16   Pete Glatterawn T Langeland, MD  cephALEXin (KEFLEX) 500 MG capsule Take 1 capsule (500 mg total) by mouth 3 (three) times daily. 01/24/17   Lenn SinkNorman S Regal, DPM  ferrous sulfate (FERROUSUL) 325 (65 FE) MG tablet Take 1 tablet (325 mg total) by mouth daily with breakfast. 11/26/16   Pete Glatterawn T Langeland, MD  hydrOXYzine (ATARAX/VISTARIL) 10 MG tablet Take 1 tablet (10 mg total) by mouth 3 (three) times daily as needed for anxiety (agitation). Patient not taking: Reported on 01/15/2017 12/31/16   Pete Glatterawn T Langeland, MD  QUEtiapine (SEROQUEL) 50 MG tablet Take 50 mg by mouth every 12 (twelve) hours. 08/15/16   Historical Provider, MD  ramipril (ALTACE) 10 MG capsule Take 1 capsule (10 mg total) by mouth daily. 11/25/16   Pete Glatterawn T Langeland, MD  sertraline (ZOLOFT) 100 MG tablet Take 1 tablet (100 mg  total) by mouth daily. 11/25/16   Pete Glatterawn T Langeland, MD  Vitamin D, Ergocalciferol, (DRISDOL) 50000 units CAPS capsule Take 1 capsule (50,000 Units total) by mouth every 7 (seven) days. Patient not taking: Reported on 01/28/2017 11/26/16   Pete Glatterawn T Langeland, MD     Objective:   Vitals:   01/28/17 1438  BP: (!) 133/39  Pulse: 76  Resp: 16  Temp: 97.6 F (36.4 C)  TempSrc: Oral  SpO2: 98%  Weight: 175 lb (79.4 kg)    Exam General appearance : Awake, alert, not in any distress. Speech Clear. Not toxic looking, pleasant. HEENT: Atraumatic and Normocephalic, pupils equally reactive to light. Neck: supple, no JVD.  Chest:Good air entry bilaterally, no added sounds. CVS: S1 S2 regular, no murmurs/gallups or rubs. Abdomen: Bowel sounds active, obese, tender and not distended with no gaurding, rigidity or rebound. Extremities: right foot is very edematous, there are prior surgical wounds, there is a media aspect, near ankle, longitudinal wound, appears to be dehiscing vs new wound, induration/fluctuance/serous/sang drainage noted - difficult to r/o underlying abscess, ttp. Warm to touch.  Noted calluses great toe.  Left foot wound  W. 3x3cm heal ulcer as well. Neurology: Awake alert, and oriented X 3, CN II-XII grossly intact, Non focal Skin:No Rash  Data Review No results found for: HGBA1C  Depression screen [facility] 2/9 01/28/2017 12/31/2016  11/25/2016 09/03/2016  Decreased Interest 1 2 0 0  Down, Depressed, Hopeless 0 (No Data) 0 0  PHQ - 2 Score 1 2 0 0  Altered sleeping (No Data) (No Data) - -  Tired, decreased energy 3 3 - -  Change in appetite 0 3 - -  Feeling bad or failure about yourself  0 0 - -  Trouble concentrating 3 3 - -  Moving slowly or fidgety/restless 3 3 - -  Suicidal thoughts 0 2 - -      Assessment & Plan   1. Sepsis, due to unspecified organism (HCC) Soft diastolic bp, concern for sepsis/sirs at this time, 2nd to ongoing foot wound, cannnot r/o abscess, likely has  pvd as well, prior surgery on same foot makes it very difficult to access. - failed po abx. - likely will need ct leg, broad spec iv abx, vanco/zosyn/surgical consult. - recd to pt and dgt to go to ER for further eval, I called ahead and talked to Sharlot Gowda the charge rn of pt's arrival  2. Possible Wound abscess, initial encounter May need ct leg/iv abx  Failed po abx.  3. Dementia with behavioral disturbance, unspecified dementia type Actually stable today. May need haldol at night, prn, does sundown.  4. HTN (hypertension), benign Soft diastolic today,      Patient have been counseled extensively about nutrition and exercise  Return in about 2 weeks (around 02/11/2017).  The patient was given clear instructions to go to ER or return to medical center if symptoms don't improve, worsen or new problems develop. The patient verbalized understanding. The patient was told to call to get lab results if they haven't heard anything in the next week.   This note has been created with Education officer, environmental. Any transcriptional errors are unintentional.   Pete Glatter, MD, MBA/MHA [facility] and [facility] [place], [place] [phone]   01/28/2017, 2:44 PM

## 2017-01-28 NOTE — ED Triage Notes (Signed)
Pt arrives from pcp office for concerns of wound infection on right foot. pts family member states that patients foot has been red x1 week, hx of traumatic fracture on same foot.

## 2017-01-28 NOTE — ED Notes (Signed)
Patient returned to A-6. Officers in doorway encouraging patient to stay in room. Patient making no sense when speaking, flight of ideas. MD notified patient has returned to A-6. Offered OJ and Sprite, (possible low blood sugar?) but patient knocked Sprite out of RN's hand.

## 2017-01-28 NOTE — ED Notes (Addendum)
Pt started wandering into the waiting room and not wanting to remain in his room. This RN followed pt around in the waiting room where pt was yelling and cussing, flight of ideas. This RN escorted pt back into his room. Security at side. MD aware.

## 2017-01-29 ENCOUNTER — Inpatient Hospital Stay (HOSPITAL_COMMUNITY): Payer: Medicare Other

## 2017-01-29 DIAGNOSIS — Z7982 Long term (current) use of aspirin: Secondary | ICD-10-CM | POA: Diagnosis not present

## 2017-01-29 DIAGNOSIS — N179 Acute kidney failure, unspecified: Secondary | ICD-10-CM | POA: Diagnosis present

## 2017-01-29 DIAGNOSIS — Z79899 Other long term (current) drug therapy: Secondary | ICD-10-CM | POA: Diagnosis not present

## 2017-01-29 DIAGNOSIS — D649 Anemia, unspecified: Secondary | ICD-10-CM | POA: Diagnosis present

## 2017-01-29 DIAGNOSIS — L97509 Non-pressure chronic ulcer of other part of unspecified foot with unspecified severity: Secondary | ICD-10-CM | POA: Diagnosis present

## 2017-01-29 DIAGNOSIS — Z95820 Peripheral vascular angioplasty status with implants and grafts: Secondary | ICD-10-CM | POA: Diagnosis not present

## 2017-01-29 DIAGNOSIS — E860 Dehydration: Secondary | ICD-10-CM | POA: Diagnosis present

## 2017-01-29 DIAGNOSIS — L8990 Pressure ulcer of unspecified site, unspecified stage: Secondary | ICD-10-CM | POA: Diagnosis present

## 2017-01-29 DIAGNOSIS — E876 Hypokalemia: Secondary | ICD-10-CM | POA: Diagnosis present

## 2017-01-29 DIAGNOSIS — L03115 Cellulitis of right lower limb: Secondary | ICD-10-CM | POA: Diagnosis present

## 2017-01-29 DIAGNOSIS — I10 Essential (primary) hypertension: Secondary | ICD-10-CM | POA: Diagnosis present

## 2017-01-29 DIAGNOSIS — S91302D Unspecified open wound, left foot, subsequent encounter: Secondary | ICD-10-CM | POA: Diagnosis not present

## 2017-01-29 DIAGNOSIS — N390 Urinary tract infection, site not specified: Secondary | ICD-10-CM | POA: Diagnosis present

## 2017-01-29 DIAGNOSIS — N1330 Unspecified hydronephrosis: Secondary | ICD-10-CM | POA: Diagnosis present

## 2017-01-29 DIAGNOSIS — Z89429 Acquired absence of other toe(s), unspecified side: Secondary | ICD-10-CM | POA: Diagnosis not present

## 2017-01-29 DIAGNOSIS — Z888 Allergy status to other drugs, medicaments and biological substances status: Secondary | ICD-10-CM | POA: Diagnosis not present

## 2017-01-29 DIAGNOSIS — N320 Bladder-neck obstruction: Secondary | ICD-10-CM | POA: Diagnosis present

## 2017-01-29 DIAGNOSIS — S91302A Unspecified open wound, left foot, initial encounter: Secondary | ICD-10-CM | POA: Diagnosis present

## 2017-01-29 DIAGNOSIS — Z885 Allergy status to narcotic agent status: Secondary | ICD-10-CM | POA: Diagnosis not present

## 2017-01-29 DIAGNOSIS — F0391 Unspecified dementia with behavioral disturbance: Secondary | ICD-10-CM | POA: Diagnosis not present

## 2017-01-29 LAB — C-REACTIVE PROTEIN: CRP: 6.3 mg/dL — ABNORMAL HIGH (ref <1.0)

## 2017-01-29 LAB — URINALYSIS, ROUTINE W REFLEX MICROSCOPIC
BACTERIA UA: NONE SEEN
Bilirubin Urine: NEGATIVE
Glucose, UA: NEGATIVE mg/dL
Ketones, ur: NEGATIVE mg/dL
LEUKOCYTES UA: NEGATIVE
NITRITE: NEGATIVE
Protein, ur: NEGATIVE mg/dL
Specific Gravity, Urine: 1.008 (ref 1.005–1.030)
Squamous Epithelial / LPF: NONE SEEN
pH: 6 (ref 5.0–8.0)

## 2017-01-29 LAB — CBC
HCT: 25.9 % — ABNORMAL LOW (ref 39.0–52.0)
HEMOGLOBIN: 7.7 g/dL — AB (ref 13.0–17.0)
MCH: 23.7 pg — ABNORMAL LOW (ref 26.0–34.0)
MCHC: 29.7 g/dL — AB (ref 30.0–36.0)
MCV: 79.7 fL (ref 78.0–100.0)
Platelets: 318 10*3/uL (ref 150–400)
RBC: 3.25 MIL/uL — ABNORMAL LOW (ref 4.22–5.81)
RDW: 14.8 % (ref 11.5–15.5)
WBC: 10.1 10*3/uL (ref 4.0–10.5)

## 2017-01-29 LAB — BASIC METABOLIC PANEL
Anion gap: 13 (ref 5–15)
BUN: 17 mg/dL (ref 6–20)
CHLORIDE: 106 mmol/L (ref 101–111)
CO2: 26 mmol/L (ref 22–32)
Calcium: 8.2 mg/dL — ABNORMAL LOW (ref 8.9–10.3)
Creatinine, Ser: 1.71 mg/dL — ABNORMAL HIGH (ref 0.61–1.24)
GFR calc Af Amer: 41 mL/min — ABNORMAL LOW (ref >60)
GFR calc non Af Amer: 36 mL/min — ABNORMAL LOW (ref >60)
GLUCOSE: 92 mg/dL (ref 65–99)
POTASSIUM: 3.8 mmol/L (ref 3.5–5.1)
Sodium: 145 mmol/L (ref 135–145)

## 2017-01-29 LAB — SODIUM, URINE, RANDOM: SODIUM UR: 27 mmol/L

## 2017-01-29 LAB — SEDIMENTATION RATE: Sed Rate: 55 mm/hr — ABNORMAL HIGH (ref 0–16)

## 2017-01-29 LAB — CREATININE, URINE, RANDOM: CREATININE, URINE: 64.19 mg/dL

## 2017-01-29 LAB — OCCULT BLOOD X 1 CARD TO LAB, STOOL: FECAL OCCULT BLD: NEGATIVE

## 2017-01-29 MED ORDER — HALOPERIDOL LACTATE 5 MG/ML IJ SOLN
2.5000 mg | Freq: Once | INTRAMUSCULAR | Status: AC
  Administered 2017-01-29: 2.5 mg via INTRAVENOUS
  Filled 2017-01-29: qty 1

## 2017-01-29 MED ORDER — ACETAMINOPHEN 325 MG PO TABS: 650.0000 mg | ORAL_TABLET | Freq: Four times a day (QID) | ORAL | Status: DC | PRN

## 2017-01-29 MED ORDER — ATENOLOL 25 MG PO TABS
25.0000 mg | ORAL_TABLET | Freq: Every day | ORAL | Status: DC
Start: 2017-01-29 — End: 2017-02-02
  Administered 2017-01-30 – 2017-02-02 (×3): 25 mg via ORAL
  Filled 2017-01-29 (×5): qty 1

## 2017-01-29 MED ORDER — SODIUM CHLORIDE 0.9 % IV BOLUS (SEPSIS)
1000.0000 mL | Freq: Once | INTRAVENOUS | Status: AC
  Administered 2017-01-29: 1000 mL via INTRAVENOUS

## 2017-01-29 MED ORDER — ONDANSETRON HCL 4 MG/2ML IJ SOLN
4.0000 mg | Freq: Four times a day (QID) | INTRAMUSCULAR | Status: DC | PRN
  Filled 2017-01-29: qty 2

## 2017-01-29 MED ORDER — ORAL CARE MOUTH RINSE
15.0000 mL | Freq: Two times a day (BID) | OROMUCOSAL | Status: DC
  Administered 2017-01-29 – 2017-02-01 (×7): 15 mL via OROMUCOSAL

## 2017-01-29 MED ORDER — BISACODYL 5 MG PO TBEC: 5.0000 mg | DELAYED_RELEASE_TABLET | Freq: Every day | ORAL | Status: DC | PRN

## 2017-01-29 MED ORDER — ONDANSETRON HCL 4 MG PO TABS: 4.0000 mg | ORAL_TABLET | Freq: Four times a day (QID) | ORAL | Status: DC | PRN

## 2017-01-29 MED ORDER — MUPIROCIN CALCIUM 2 % EX CREA
TOPICAL_CREAM | Freq: Every day | CUTANEOUS | Status: DC
  Administered 2017-01-29 – 2017-02-02 (×4): via TOPICAL
  Filled 2017-01-29: qty 15

## 2017-01-29 MED ORDER — ACETAMINOPHEN 325 MG PO TABS
650.0000 mg | ORAL_TABLET | Freq: Four times a day (QID) | ORAL | Status: DC | PRN
  Administered 2017-01-30: 650 mg via ORAL
  Filled 2017-01-29: qty 2

## 2017-01-29 MED ORDER — HYDROCODONE-ACETAMINOPHEN 5-325 MG PO TABS: 1.0000 | ORAL_TABLET | ORAL | Status: DC | PRN

## 2017-01-29 MED ORDER — ACETAMINOPHEN 650 MG RE SUPP
650.0000 mg | Freq: Four times a day (QID) | RECTAL | Status: DC | PRN
Start: 2017-01-29 — End: 2017-02-02

## 2017-01-29 MED ORDER — ENOXAPARIN SODIUM 40 MG/0.4ML ~~LOC~~ SOLN
40.0000 mg | SUBCUTANEOUS | Status: DC
  Administered 2017-01-29: 40 mg via SUBCUTANEOUS
  Filled 2017-01-29: qty 0.4

## 2017-01-29 MED ORDER — VANCOMYCIN HCL 10 G IV SOLR: 1250.0000 mg | INTRAVENOUS | Status: DC

## 2017-01-29 MED ORDER — ASPIRIN EC 81 MG PO TBEC
81.0000 mg | DELAYED_RELEASE_TABLET | Freq: Every day | ORAL | Status: DC
  Administered 2017-01-30 – 2017-02-02 (×4): 81 mg via ORAL
  Filled 2017-01-29 (×4): qty 1

## 2017-01-29 MED ORDER — ZOLPIDEM TARTRATE 5 MG PO TABS: 5.0000 mg | ORAL_TABLET | Freq: Every evening | ORAL | Status: DC | PRN

## 2017-01-29 MED ORDER — SODIUM CHLORIDE 0.9 % IV SOLN
INTRAVENOUS | Status: DC
  Administered 2017-01-30: 01:00:00 via INTRAVENOUS

## 2017-01-29 MED ORDER — PIPERACILLIN-TAZOBACTAM 3.375 G IVPB
3.3750 g | Freq: Three times a day (TID) | INTRAVENOUS | Status: DC
  Administered 2017-01-29 – 2017-01-30 (×4): 3.375 g via INTRAVENOUS
  Filled 2017-01-29 (×5): qty 50

## 2017-01-29 MED ORDER — ACETAMINOPHEN 650 MG RE SUPP: 650.0000 mg | Freq: Four times a day (QID) | RECTAL | Status: DC | PRN

## 2017-01-29 MED ORDER — LORAZEPAM 2 MG/ML IJ SOLN
0.5000 mg | Freq: Four times a day (QID) | INTRAMUSCULAR | Status: DC | PRN
  Administered 2017-01-29 (×2): 0.5 mg via INTRAVENOUS
  Filled 2017-01-29 (×2): qty 1

## 2017-01-29 MED ORDER — SODIUM CHLORIDE 0.9 % IV SOLN
INTRAVENOUS | Status: DC
  Administered 2017-01-29: 01:00:00 via INTRAVENOUS

## 2017-01-29 MED ORDER — HALOPERIDOL LACTATE 5 MG/ML IJ SOLN
1.0000 mg | Freq: Four times a day (QID) | INTRAMUSCULAR | Status: DC | PRN
  Administered 2017-01-29 – 2017-01-30 (×5): 1 mg via INTRAVENOUS
  Filled 2017-01-29 (×5): qty 1

## 2017-01-29 NOTE — Progress Notes (Signed)
PROGRESS NOTE  Scott MainlandJohn Park  ZOX:[id number] DOB: 05/30/1933  DOA: 01/28/2017 PCP: Pete Glatterawn T Langeland, MD   Brief Narrative:  83 year old male with PMH of HTN, advanced dementia, right lower leg fracture 17 years ago with vascular injury and status post bypass to right lower leg and partial toe amputation, open wound left heel, lives at home and is partially dependent for most of ADLs, presented to ED with history of severe right foot pain and redness of one-week duration. Seen by a podiatrist on 01/24/17 and started on Keflex without much improvement. Seen by PCP on day of admission who recommended ED visit. Admitted for cellulitis of right foot..   Assessment & Plan:   Principal Problem:   Cellulitis of right foot Active Problems:   HTN (hypertension), benign   Dementia with behavioral disturbance   Normocytic anemia   AKI (acute kidney injury) (HCC)   Open wound of left heel   1. Right foot cellulitis: Complicating prior complex surgically altered for anatomy. Failed outpatient oral antibiotic treatment. Treating empirically with IV vancomycin and Zosyn. Follow blood cultures. ABIs technically limited. Right and left ABI of 1.44 suggestive of medial calcification of the interrogated arteries. Monitor closely. CRP 6.3.  2. Left heel wound: Management per WOC RN consultation on 01/29/17.  3. Essential hypertension: Controlled. Continue atenolol.  4. Dementia with behavioral disturbance: Intermittent agitation, better after Haldol.  5. Normocytic anemia: Follow CBCs and transfuse if hemoglobin <7 g per DL. Hemoglobin was 11.7 on 11/25/16 which dropped to 8.8 on admission and 7.7 today. No overt bleeding.  6. Acute kidney injury: Normal creatinine of 0.82 on 11/25/16. Presented with creatinine of 1.65. May be prerenal due to decreased oral intake. IV fluids and follow BMP in a.m.   DVT prophylaxis: Lovenox Code Status: Full Family Communication: None at bedside Disposition Plan: To be  determined, likely home when medically stable.   Consultants:   None  Procedures:   None  Antimicrobials:   IV vancomycin  IV Zosyn    Subjective: Currently somnolent from medications. Received a dose of Haldol at 3:20 AM. Opens eyes briefly. As per RN, no acute issues apart from earlier agitation which is improved with medications.  Objective:  Vitals:   01/29/17 0100 01/29/17 0130 01/29/17 0253 01/29/17 0511  BP: 182/69 145/64 (!) 159/132 123/64  Pulse: 78 82 91 66  Resp:  18 18 18   Temp:   98 F (36.7 C) 98.1 F (36.7 C)  TempSrc:   Oral Oral  SpO2: 97% 97% 98% 96%  Weight:      Height:       No intake or output data in the 24 hours ending 01/29/17 1316 Filed Weights   01/28/17 1527  Weight: 79.4 kg (175 lb)    Examination:  General exam: Pleasant elderly male lying comfortably supine in bed. Respiratory system: Clear to auscultation. Respiratory effort normal. Cardiovascular system: S1 & S2 heard, RRR. No JVD, murmurs, rubs, gallops or clicks. No pedal edema. Gastrointestinal system: Abdomen is nondistended, soft and nontender. No organomegaly or masses felt. Normal bowel sounds heard. Central nervous system: Somnolent but arousable. No focal neurological deficits. Extremities: Moves all limbs symmetrically. Right lower leg and foot deformed from prior healed surgical scars. Mild erythema, warmth and tenderness without clear open wounds, fluctuation or crepitus. Skin: As per WOC RN evaluation on 01/29/17 Psychiatry: Judgement and insight cannot be assessed.    Data Reviewed: I have personally reviewed following labs and imaging studies  CBC:  Recent  Labs Lab 01/28/17 1535 01/29/17 0158  WBC 9.4 10.1  NEUTROABS 6.9  --   HGB 8.8* 7.7*  HCT 29.5* 25.9*  MCV 79.7 79.7  PLT 378 318   Basic Metabolic Panel:  Recent Labs Lab 01/28/17 1535 01/29/17 0158  NA 145 145  K 3.5 3.8  CL 109 106  CO2 29 26  GLUCOSE 95 92  BUN 16 17  CREATININE  1.65* 1.71*  CALCIUM 8.7* 8.2*   GFR: Estimated Creatinine Clearance: 35 mL/min (by C-G formula based on SCr of 1.71 mg/dL (H)). Liver Function Tests:  Recent Labs Lab 01/28/17 1535  AST 21  ALT 11*  ALKPHOS 45  BILITOT 0.5  PROT 6.2*  ALBUMIN 3.0*   No results for input(s): LIPASE, AMYLASE in the last 168 hours. No results for input(s): AMMONIA in the last 168 hours. Coagulation Profile: No results for input(s): INR, PROTIME in the last 168 hours. Cardiac Enzymes: No results for input(s): CKTOTAL, CKMB, CKMBINDEX, TROPONINI in the last 168 hours. BNP (last 3 results) No results for input(s): PROBNP in the last 8760 hours. HbA1C: No results for input(s): HGBA1C in the last 72 hours. CBG: No results for input(s): GLUCAP in the last 168 hours. Lipid Profile: No results for input(s): CHOL, HDL, LDLCALC, TRIG, CHOLHDL, LDLDIRECT in the last 72 hours. Thyroid Function Tests: No results for input(s): TSH, T4TOTAL, FREET4, T3FREE, THYROIDAB in the last 72 hours. Anemia Panel: No results for input(s): VITAMINB12, FOLATE, FERRITIN, TIBC, IRON, RETICCTPCT in the last 72 hours.  Sepsis Labs:  Recent Labs Lab 01/28/17 1535 01/28/17 1550 01/28/17 2110 01/29/17 0158  WBC 9.4  --   --  10.1  LATICACIDVEN  --  0.82 1.15  --     No results found for this or any previous visit (from the past 240 hour(s)).       Radiology Studies: No results found.      Scheduled Meds: . aspirin EC  81 mg Oral Daily  . atenolol  25 mg Oral Daily  . enoxaparin (LOVENOX) injection  40 mg Subcutaneous Q24H  . mouth rinse  15 mL Mouth Rinse BID  . mupirocin cream   Topical Daily  . piperacillin-tazobactam (ZOSYN)  IV  3.375 g Intravenous Q8H  . [START ON 01/31/2017] vancomycin  1,250 mg Intravenous Q48H   Continuous Infusions: . sodium chloride 100 mL/hr at 01/29/17 0038     LOS: 0 days      [facility], MD [facility] Pager [phone]  If 7PM-7AM, please contact  night-coverage [email or web address] Password [email or web address] 01/29/2017, 1:16 PM

## 2017-01-29 NOTE — Consult Note (Addendum)
WOC Nurse wound consult note Reason for Consult: Consult requested for bilat feet.  Pt has been followed by a podiatrist prior to admission and had surgical intervention to right leg approx 17 years ago, according to the EMR. Wound type: Left plantar heel with chronic full thickness wound; 1X3X.6cm, 70% red, 30% yellow, small amt tan drainage, no odor or fluctuance. Pressure Injury POA: This was present on admission but is NOT a pressure injury. Right leg with appearance consistent with previous skin graft which had healed and now has patchy areas of dry scabs along the old incision line. Measurement:Right heel with dry yellow callous, .2X.2cm, no open wound, odor, drainage, or fluctuance; no topical treatment indicated. Right calf with several patchy areas of dry dark reddish brown scabs; no odor, drainage, or fluctuance.5X.2cm, 1X.2cm, 1.2X.2cm Dressing procedure/placement/frequency: Bactroban to promote moist healing to left heel and right leg scabbed areas.  Float heels to reduce pressure.  Pt should resume follow-up with podiatrist after discharge. No family present to discuss plan of care and patient does not appear to understand. Please re-consult if further assistance is needed.  Thank-you,  Cammie Mcgeeawn Bohdan Macho MSN, RN, CWOCN, [place]AP, CNS [phone]

## 2017-01-29 NOTE — ED Notes (Signed)
Patient became very aggressive towards staff.  Patient was redirected but then became aggressive again.  0.5 mg ativan given.  Patient tolerated well and is now resting.  Will continue to monitor

## 2017-01-29 NOTE — Progress Notes (Signed)
VASCULAR LAB PRELIMINARY  ARTERIAL  ABI completed: The study was technically limited due to movement and poor patient cooperation.  Right and left ABI of 1.44 are suggestive of medial calcification of the interrogated arteries. All interrogated arteries were non-compressible. ABI is likely inaccurate due to non-compressibility.   RIGHT    LEFT    PRESSURE WAVEFORM  PRESSURE WAVEFORM  BRACHIAL 177 Biphasic BRACHIAL    DP >254 Biphasic DP >254 Biphasic  PT >254 Biphasic PT >254 Monophasic    RIGHT LEFT  ABI 1.44 1.44     Scott StainGregory J Chanan Park, RVT 01/29/2017, 11:41 AM

## 2017-01-29 NOTE — H&P (Signed)
History and Physical    Scott Park JQG:[id number] DOB: [birth year] DOA: 01/28/2017  Referring MD/NP/PA:   PCP: Maren Reamer, MD   Patient coming from:  The patient is coming from home.  At baseline, pt is partially dependent for most of ADL.        Chief Complaint: right foot pain  HPI: Scott Park is a 83 y.o. male with medical history significant of hypertension, dementia, sundown Syndrome, Who Presents with Right Foot Pain.  Patient has dementia, and is unable to provide accurate medical history, therefore, most of the history is obtained by discussing the case with ED physician, per EMS report, and with the nursing staff.  Per EDP, pt's family report that pt had right lower leg fracture 17 years ago, and vascular injury and is s/p of bypass to right lower leg and half a toe removed. Per report, pt has severe right foot pain and redness for about one week. Pt was seen by podiatrist last on 01/24/17 and started with Keflex, but redness and pain have been persistent. He saw PCP today who recommended to come to ED for r/o sepsis, eval of foot wound. Pt also has an open wound in left heel. No active cough, diarrhea, nausea, vomiting noted in ED. Pt has hx of dementia, sundowning. Pt had one episode of agitation in ED and was yelling and cussing, which improved after giving one dose of Haldol.  ED Course: pt was found to have WBC 9.4, lactate 0.82 1.15, negative urinalysis, AKi with creatinine 1.65, temperature normal, O2 sat 97% on room air. Patient is admitted to [facility] bed as inpatient.  Review of Systems: Could not be reviewed accurately due to dementia.  Allergy:  Allergies  Allergen Reactions  . Codeine Anxiety  . Hydroxyzine Other (See Comments)    Makes things worse  . Seroquel [Quetiapine] Other (See Comments)    Makes things worse    Past Medical History:  Diagnosis Date  . Hypertension   . SunDown syndrome     Past Surgical History:  Procedure Laterality Date    . BYPASS GRAFT Right    artery bypass  . TIBIA FRACTURE SURGERY     R shin    Social History:  reports that he has never smoked. He has never used smokeless tobacco. He reports that he does not drink alcohol or use drugs.  Family History:  Family History  Problem Relation Age of Onset  . Cancer Mother   . Cancer Father     prostate     Prior to Admission medications   Medication Sig Start Date End Date Taking? Authorizing Provider  aspirin EC 81 MG tablet Take 81 mg by mouth daily.   Yes Historical Provider, MD  atenolol (TENORMIN) 25 MG tablet Take 1 tablet (25 mg total) by mouth daily. 11/25/16  Yes Dawn Lazarus Gowda, MD  bisacodyl (DULCOLAX) 5 MG EC tablet Take 5 mg by mouth daily as needed for moderate constipation.   Yes Historical Provider, MD  cephALEXin (KEFLEX) 500 MG capsule Take 1 capsule (500 mg total) by mouth 3 (three) times daily. 01/24/17  Yes Wallene Huh, DPM    Physical Exam: Vitals:   01/28/17 1529 01/28/17 1957 01/28/17 2302 01/29/17 0011  BP: (!) 155/106 200/81 140/88 141/87  Pulse: 71 92 88 78  Resp: _0 Temp: 97.9 F (36.6 C)     SpO2: 100% 97% 97% 98%  Weight:      Height:  General: Not in acute distress HEENT:       Eyes: PERRL, EOMI, no scleral icterus.       ENT: No discharge from the ears and nose, no pharynx injection, no tonsillar enlargement.        Neck: No JVD, no bruit, no mass felt. Heme: No neck lymph node enlargement. Cardiac: S1/S2, RRR, No murmurs, No gallops or rubs. Respiratory: No rales, wheezing, rhonchi or rubs. GI: Soft, nondistended, nontender, no rebound pain, no organomegaly, BS present. GU: No hematuria Ext: 1+DP/PT pulse bilaterally. The right foot is red, warm and tender. There is an open wound in the left heel without purulent discharge. The right lower leg is s/p of surgical change. Musculoskeletal: No joint deformities, No joint redness or warmth, no limitation of ROM in spin. Skin: No rashes.   Neuro: Alert, oriented X3, cranial nerves II-XII grossly intact, moves all extremities normally. Psych: Patient is not psychotic, no suicidal or hemocidal ideation.  Labs on Admission: I have personally reviewed following labs and imaging studies  CBC:  Recent Labs Lab 01/28/17 1535  WBC 9.4  NEUTROABS 6.9  HGB 8.8*  HCT 29.5*  MCV 79.7  PLT 106   Basic Metabolic Panel:  Recent Labs Lab 01/28/17 1535  NA 145  K 3.5  CL 109  CO2 29  GLUCOSE 95  BUN 16  CREATININE 1.65*  CALCIUM 8.7*   GFR: Estimated Creatinine Clearance: 36.3 mL/min (by C-G formula based on SCr of 1.65 mg/dL (H)). Liver Function Tests:  Recent Labs Lab 01/28/17 1535  AST 21  ALT 11*  ALKPHOS 45  BILITOT 0.5  PROT 6.2*  ALBUMIN 3.0*   No results for input(s): LIPASE, AMYLASE in the last 168 hours. No results for input(s): AMMONIA in the last 168 hours. Coagulation Profile: No results for input(s): INR, PROTIME in the last 168 hours. Cardiac Enzymes: No results for input(s): CKTOTAL, CKMB, CKMBINDEX, TROPONINI in the last 168 hours. BNP (last 3 results) No results for input(s): PROBNP in the last 8760 hours. HbA1C: No results for input(s): HGBA1C in the last 72 hours. CBG: No results for input(s): GLUCAP in the last 168 hours. Lipid Profile: No results for input(s): CHOL, HDL, LDLCALC, TRIG, CHOLHDL, LDLDIRECT in the last 72 hours. Thyroid Function Tests: No results for input(s): TSH, T4TOTAL, FREET4, T3FREE, THYROIDAB in the last 72 hours. Anemia Panel: No results for input(s): VITAMINB12, FOLATE, FERRITIN, TIBC, IRON, RETICCTPCT in the last 72 hours. Urine analysis:    Component Value Date/Time   COLORURINE YELLOW 01/29/2017 0018   APPEARANCEUR CLEAR 01/29/2017 0018   LABSPEC 1.008 01/29/2017 0018   PHURINE 6.0 01/29/2017 0018   GLUCOSEU NEGATIVE 01/29/2017 0018   HGBUR SMALL (A) 01/29/2017 0018   BILIRUBINUR NEGATIVE 01/29/2017 0018   KETONESUR NEGATIVE 01/29/2017 0018    PROTEINUR NEGATIVE 01/29/2017 0018   NITRITE NEGATIVE 01/29/2017 0018   LEUKOCYTESUR NEGATIVE 01/29/2017 0018   Sepsis Labs: _0 (procalcitonin:4,lacticidven:4) )No results found for this or any previous visit (from the past 240 hour(s)).   Radiological Exams on Admission: No results found.   EKG:  Not done in ED, will get one.   Assessment/Plan Principal Problem:   Cellulitis of right foot Active Problems:   HTN (hypertension), benign   Dementia with behavioral disturbance   Normocytic anemia   AKI (acute kidney injury) ([facility])   Open wound of left heel   Cellulitis of right foot: The right foot is red, warm and tender, consistent with cellulitis. Lactate is normal. Hemodynamically  stable. Clinically nonseptic. Pt failed outpt oral Abx treatment  - will admit to med-surg bed as inpt - Empiric antimicrobial treatment with vancomycin and Zosyn per pharmacy - PRN Zofran for nausea, norco for pain - Blood cultures x 2  - ESR and CRP - wound care consult - will check ABI - IVF: 2.0 L of NS bolus in ED, followed by 100 cc/h  HTN (hypertension), benign: -continue atenolol  Dementia with behavioral disturbance: -prn ativan for agitation  Normocytic anemia: Hemoglobin dropped from 11.7 on 11/25/16-8.8. -Check FOBT -Follow up with CBC  AKI: Likely due to prerenal secondary to dehydration - IVF as above - Check FeNa  - Follow up renal function by BMP  Open wound of left heel: does not seem to be infected --wound care consult   DVT ppx: SQ Lovenox Code Status: Full code (pt has Dementia and cannot tell his code status. Will temporarily put full code now, need to be addressed again in morning with family) Family Communication: None at bed side. Disposition Plan:  Anticipate discharge back to previous home environment Consults called:  None Admission status:  medical floor/inpt  Date of Service 01/29/2017    Ivor Costa [facility] Pager [phone]  If  7PM-7AM, please contact night-coverage [email or web address] Password [email or web address] 01/29/2017, 1:11 AM

## 2017-01-29 NOTE — Progress Notes (Signed)
Pt. admitted to 5W-23 via stretcher from ER- restless, combative, cursing, and trying to get OOB; safety mitts placed on hands; staff at bedside for a while; BP 159/132- pt. Moving a lot and won't be still- tried 2-3 times to check it and unsuccessful; Craige CottaKirby, NP paged about pt. and about BP. Orders given to give more Haldol. Pt. not answering questions appropriately.

## 2017-01-29 NOTE — Progress Notes (Signed)
Pharmacy Antibiotic Note  Scott Park is a 83 y.o. male admitted on 01/28/2017 with Right foot pain/cellulitis.  Pharmacy has been consulted for Vancomycin and Zosyn dosing.  Vancomycin 1500 mg IV given in ED at 0030  Plan: Vancomycin 1250 mg IV q48h Zosyn 3.375 g IV q8h   Height: 5\' 10"  (177.8 cm) Weight: 175 lb (79.4 kg) IBW/kg (Calculated) : 73  Temp (24hrs), Avg:97.8 F (36.6 C), Min:97.6 F (36.4 C), Max:97.9 F (36.6 C)   Recent Labs Lab 01/28/17 1535 01/28/17 1550 01/28/17 2110  WBC 9.4  --   --   CREATININE 1.65*  --   --   LATICACIDVEN  --  0.82 1.15    Estimated Creatinine Clearance: 36.3 mL/min (by C-G formula based on SCr of 1.65 mg/dL (H)).    Allergies  Allergen Reactions  . Codeine Anxiety  . Hydroxyzine Other (See Comments)    Makes things worse  . Seroquel [Quetiapine] Other (See Comments)    Makes things worse    Scott Park, Scott Park 01/29/2017 2:19 AM

## 2017-01-29 NOTE — Plan of Care (Signed)
Problem: Education: Goal: Knowledge of [facility] Education information/materials will improve Outcome: Not Progressing POC reviewed with pt- at present pt. confused, combative.

## 2017-01-30 ENCOUNTER — Inpatient Hospital Stay (HOSPITAL_COMMUNITY): Payer: Medicare Other

## 2017-01-30 DIAGNOSIS — N179 Acute kidney failure, unspecified: Secondary | ICD-10-CM

## 2017-01-30 DIAGNOSIS — E876 Hypokalemia: Secondary | ICD-10-CM

## 2017-01-30 DIAGNOSIS — N1330 Unspecified hydronephrosis: Secondary | ICD-10-CM

## 2017-01-30 DIAGNOSIS — D649 Anemia, unspecified: Secondary | ICD-10-CM

## 2017-01-30 DIAGNOSIS — F0391 Unspecified dementia with behavioral disturbance: Secondary | ICD-10-CM

## 2017-01-30 DIAGNOSIS — S91302D Unspecified open wound, left foot, subsequent encounter: Secondary | ICD-10-CM

## 2017-01-30 DIAGNOSIS — N320 Bladder-neck obstruction: Secondary | ICD-10-CM

## 2017-01-30 DIAGNOSIS — L8990 Pressure ulcer of unspecified site, unspecified stage: Secondary | ICD-10-CM | POA: Diagnosis present

## 2017-01-30 DIAGNOSIS — I10 Essential (primary) hypertension: Secondary | ICD-10-CM

## 2017-01-30 LAB — CBC
HEMATOCRIT: 29.6 % — AB (ref 39.0–52.0)
Hemoglobin: 8.8 g/dL — ABNORMAL LOW (ref 13.0–17.0)
MCH: 23.7 pg — AB (ref 26.0–34.0)
MCHC: 29.7 g/dL — AB (ref 30.0–36.0)
MCV: 79.6 fL (ref 78.0–100.0)
Platelets: 404 10*3/uL — ABNORMAL HIGH (ref 150–400)
RBC: 3.72 MIL/uL — ABNORMAL LOW (ref 4.22–5.81)
RDW: 15.2 % (ref 11.5–15.5)
WBC: 12.6 10*3/uL — ABNORMAL HIGH (ref 4.0–10.5)

## 2017-01-30 LAB — BASIC METABOLIC PANEL
Anion gap: 16 — ABNORMAL HIGH (ref 5–15)
BUN: 20 mg/dL (ref 6–20)
CALCIUM: 8.7 mg/dL — AB (ref 8.9–10.3)
CO2: 23 mmol/L (ref 22–32)
Chloride: 107 mmol/L (ref 101–111)
Creatinine, Ser: 2.28 mg/dL — ABNORMAL HIGH (ref 0.61–1.24)
GFR calc Af Amer: 29 mL/min — ABNORMAL LOW (ref >60)
GFR, EST NON AFRICAN AMERICAN: 25 mL/min — AB (ref >60)
GLUCOSE: 112 mg/dL — AB (ref 65–99)
Potassium: 3.3 mmol/L — ABNORMAL LOW (ref 3.5–5.1)
Sodium: 146 mmol/L — ABNORMAL HIGH (ref 135–145)

## 2017-01-30 LAB — SODIUM, URINE, RANDOM: Sodium, Ur: 88 mmol/L

## 2017-01-30 LAB — CREATININE, URINE, RANDOM: Creatinine, Urine: 26.09 mg/dL

## 2017-01-30 MED ORDER — PRO-STAT SUGAR FREE PO LIQD
30.0000 mL | Freq: Two times a day (BID) | ORAL | Status: DC
  Administered 2017-01-31 – 2017-02-02 (×4): 30 mL via ORAL
  Filled 2017-01-30 (×6): qty 30

## 2017-01-30 MED ORDER — POTASSIUM CHLORIDE 2 MEQ/ML IV SOLN
INTRAVENOUS | Status: AC
  Administered 2017-01-30 (×2): via INTRAVENOUS
  Filled 2017-01-30 (×5): qty 1000

## 2017-01-30 MED ORDER — AMLODIPINE BESYLATE 2.5 MG PO TABS
2.5000 mg | ORAL_TABLET | Freq: Every day | ORAL | Status: DC
  Administered 2017-01-30 – 2017-01-31 (×2): 2.5 mg via ORAL
  Filled 2017-01-30 (×2): qty 1

## 2017-01-30 MED ORDER — ENSURE ENLIVE PO LIQD
237.0000 mL | ORAL | Status: DC
  Administered 2017-01-30 – 2017-02-01 (×2): 237 mL via ORAL

## 2017-01-30 MED ORDER — HALOPERIDOL LACTATE 5 MG/ML IJ SOLN
2.0000 mg | Freq: Four times a day (QID) | INTRAMUSCULAR | Status: DC | PRN
Start: 2017-01-30 — End: 2017-02-01
  Administered 2017-01-31 (×2): 2 mg via INTRAVENOUS
  Filled 2017-01-30 (×2): qty 1

## 2017-01-30 MED ORDER — ADULT MULTIVITAMIN W/MINERALS CH
1.0000 | ORAL_TABLET | Freq: Every day | ORAL | Status: DC
  Administered 2017-01-30 – 2017-02-02 (×3): 1 via ORAL
  Filled 2017-01-30 (×4): qty 1

## 2017-01-30 MED ORDER — ENOXAPARIN SODIUM 30 MG/0.3ML ~~LOC~~ SOLN
30.0000 mg | SUBCUTANEOUS | Status: DC
  Administered 2017-01-30 – 2017-02-01 (×2): 30 mg via SUBCUTANEOUS
  Filled 2017-01-30 (×3): qty 0.3

## 2017-01-30 MED ORDER — DOXYCYCLINE HYCLATE 100 MG PO TABS
100.0000 mg | ORAL_TABLET | Freq: Two times a day (BID) | ORAL | Status: DC
  Administered 2017-01-30 – 2017-02-02 (×7): 100 mg via ORAL
  Filled 2017-01-30 (×8): qty 1

## 2017-01-30 MED ORDER — HYDRALAZINE HCL 20 MG/ML IJ SOLN
10.0000 mg | Freq: Once | INTRAMUSCULAR | Status: DC
  Filled 2017-01-30: qty 1

## 2017-01-30 MED ORDER — TAMSULOSIN HCL 0.4 MG PO CAPS
0.4000 mg | ORAL_CAPSULE | Freq: Every day | ORAL | Status: DC
  Administered 2017-01-30 – 2017-02-02 (×4): 0.4 mg via ORAL
  Filled 2017-01-30 (×4): qty 1

## 2017-01-30 NOTE — Progress Notes (Signed)
Meet resistant after placing 30 ml of sterile water in foley. SO placed and MD notified.

## 2017-01-30 NOTE — Progress Notes (Addendum)
PROGRESS NOTE  Scott Park  ZOX:[id number] DOB: 09-18-1933  DOA: 01/28/2017 PCP: Pete Glatter, MD   Brief Narrative:  83 year old male with PMH of HTN, advanced dementia, right lower leg fracture 17 years ago with vascular injury and status post bypass to right lower leg and partial toe amputation, open wound left heel, lives at home and is partially dependent for most of ADLs, presented to ED with history of severe right foot pain and redness of one-week duration. Seen by a podiatrist on 01/24/17 and started on Keflex without much improvement. Seen by PCP on day of admission who recommended ED visit. Admitted for cellulitis of right foot. Cellulitis improving. Acute urinary retention and worsening renal insufficiency.   Assessment & Plan:   Principal Problem:   Cellulitis of right foot Active Problems:   HTN (hypertension), benign   Dementia with behavioral disturbance   Normocytic anemia   AKI (acute kidney injury) ([facility])   Open wound of left heel   Pressure injury of skin   1. Right foot cellulitis: Complicating prior complex surgically altered R leg/foot anatomy. Failed outpatient oral antibiotic treatment. Treating empirically with IV vancomycin and Zosyn. Follow blood cultures - negative to date.Marland Kitchen ABIs technically limited. Right and left ABI of 1.44 suggestive of medial calcification of the interrogated arteries. Monitor closely. CRP 6.3. Improved significantly.DC IV antibiotics and transition to oral doxycycline.  2. Left heel wound: Management per WOC RN consultation on 01/29/17.  3. Essential hypertension: uncontrolled. Continue atenolol.May have to add a second agent.  4. Dementia with behavioral disturbance: Intermittent agitation, better after Haldol. No agitation this morning.  5. Normocytic anemia: Follow CBCs and transfuse if hemoglobin <7 g per DL. Hemoglobin was 11.7 on 11/25/16 which dropped to 8.8 on admission and 7.7on 2/7. No overt bleeding. Hemoglobin  8.8/stable.  6. Acute kidney injury secondary to bladder outlet obstruction resulting in bilateral hydronephrosis: Normal creatinine of 0.82 on 11/25/16. Presented with creatinine of 1.65. Creatinine has increased to 2.28. Bladder scan 2/8 confirms residual volume >900 ML. In and out cath when necessary. Start Flomax. Discussed with Urology on call > who recommended putting in a Foley catheter 69 French with 30 mL balloon and filling it up with 60 mL so that patient will not be able to pull it out and leave the catheter at discharge until outpatient follow-up with them in 1 week for voiding trial in the office + start Flomax. Renal ultrasound: Moderate bilateral hydronephrosis and bladder outlet obstruction.  7. Hypokalemia: Replace in IV fluids and follow BMP.    DVT prophylaxis: Lovenox Code Status: Full Family Communication: None at bedside. Multiple attempts to call patient's son were unsuccessful Disposition Plan: To be determined, likely home when medically stable.   Consultants:   None  Procedures:   None  Antimicrobials:   IV vancomycin  IV Zosyn    Subjective: Alert and oriented to self. No agitation noted. Denies pain. Nursing cleaning after a BM.  Objective:  Vitals:   01/29/17 0511 01/29/17 1337 01/30/17 0557 01/30/17 0600  BP: 123/64 (!) 159/60 (!) 194/72 (!) 186/94  Pulse: 66 77 (!) 108   Resp: 18 20 20    Temp: 98.1 F (36.7 C) 98.3 F (36.8 C) 98.7 F (37.1 C)   TempSrc: Oral Oral    SpO2: 96% 97% 90%   Weight:      Height:        Intake/Output Summary (Last 24 hours) at 01/30/17 1231 Last data filed at 01/30/17 0554  Gross  per 24 hour  Intake             1390 ml  Output             1000 ml  Net              390 ml   Filed Weights   01/28/17 1527  Weight: 79.4 kg (175 lb)    Examination:  General exam: Pleasant elderly male lying comfortably supine in bed. Respiratory system: Clear to auscultation. Respiratory effort  normal. Cardiovascular system: S1 & S2 heard, RRR. No JVD, murmurs, rubs, gallops or clicks. No pedal edema. Gastrointestinal system: Abdomen is nondistended, soft and nontender. No organomegaly or masses felt. Normal bowel sounds heard. Central nervous system: alert and oriented to self. No focal neurological deficits. Extremities: Moves all limbs symmetrically. Right lower leg and foot deformed from prior healed surgical scars/skin graft. No further erythema, increased warmth or tenderness. No open wounds, fluctuation or crepitus to right leg/foot. Clean ulcer left heel. Skin: As per WOC RN evaluation on 01/29/17 Psychiatry: Judgement and insight cannot be assessed.    Data Reviewed: I have personally reviewed following labs and imaging studies  CBC:  Recent Labs Lab 01/28/17 1535 01/29/17 0158 01/30/17 0440  WBC 9.4 10.1 12.6*  NEUTROABS 6.9  --   --   HGB 8.8* 7.7* 8.8*  HCT 29.5* 25.9* 29.6*  MCV 79.7 79.7 79.6  PLT 378 318 404*   Basic Metabolic Panel:  Recent Labs Lab 01/28/17 1535 01/29/17 0158 01/30/17 0440  NA 145 145 146*  K 3.5 3.8 3.3*  CL 109 106 107  CO2 29 26 23   GLUCOSE 95 92 112*  BUN 16 17 20   CREATININE 1.65* 1.71* 2.28*  CALCIUM 8.7* 8.2* 8.7*   GFR: Estimated Creatinine Clearance: 26.2 mL/min (by C-G formula based on SCr of 2.28 mg/dL (H)). Liver Function Tests:  Recent Labs Lab 01/28/17 1535  AST 21  ALT 11*  ALKPHOS 45  BILITOT 0.5  PROT 6.2*  ALBUMIN 3.0*   No results for input(s): LIPASE, AMYLASE in the last 168 hours. No results for input(s): AMMONIA in the last 168 hours. Coagulation Profile: No results for input(s): INR, PROTIME in the last 168 hours. Cardiac Enzymes: No results for input(s): CKTOTAL, CKMB, CKMBINDEX, TROPONINI in the last 168 hours. BNP (last 3 results) No results for input(s): PROBNP in the last 8760 hours. HbA1C: No results for input(s): HGBA1C in the last 72 hours. CBG: No results for input(s): GLUCAP  in the last 168 hours. Lipid Profile: No results for input(s): CHOL, HDL, LDLCALC, TRIG, CHOLHDL, LDLDIRECT in the last 72 hours. Thyroid Function Tests: No results for input(s): TSH, T4TOTAL, FREET4, T3FREE, THYROIDAB in the last 72 hours. Anemia Panel: No results for input(s): VITAMINB12, FOLATE, FERRITIN, TIBC, IRON, RETICCTPCT in the last 72 hours.  Sepsis Labs:  Recent Labs Lab 01/28/17 1535 01/28/17 1550 01/28/17 2110 01/29/17 0158 01/30/17 0440  WBC 9.4  --   --  10.1 12.6*  LATICACIDVEN  --  0.82 1.15  --   --     Recent Results (from the past 240 hour(s))  Blood culture (routine x 2)     Status: None (Preliminary result)   Collection Time: 01/29/17 12:02 AM  Result Value Ref Range Status   Specimen Description BLOOD LEFT FOREARM  Final   Special Requests AEROBIC BOTTLE ONLY 5ML  Final   Culture NO GROWTH 1 DAY  Final   Report Status PENDING  Incomplete  Blood culture (routine x 2)     Status: None (Preliminary result)   Collection Time: 01/29/17 12:17 AM  Result Value Ref Range Status   Specimen Description BLOOD RIGHT WRIST  Final   Special Requests IN PEDIATRIC BOTTLE  Final   Culture NO GROWTH 1 DAY  Final   Report Status PENDING  Incomplete         Radiology Studies: US Renal  Result Date: 01/30/2017 CLINICAL DATA:  Acute kidney injury. EXAM: RENAL / URINARY TRACT ULTRASOUND COMPLETE COMPARISON:  None. FINDINGS: Right Kidney: Length: 11.2 cm. Echogenicity is increased. Moderate hydronephrosis visualized. Left Kidney: Length: 10.8. Echogenicity is increased. Moderate hydronephrosis visualized. Bladder: Irregular, trabeculated appearance of the bladder wall is seen. IMPRESSION: Moderate bilateral hydronephrosis. Increased cortical echogenicity bilaterally consistent with medical renal disease. Trabeculated appearance of the urinary bladder wall suggestive of bladder outlet obstruction. Electronically Signed   By: Drusilla Kanner M.D.   On: 01/30/2017 09:26         Scheduled Meds: . aspirin EC  81 mg Oral Daily  . atenolol  25 mg Oral Daily  . enoxaparin (LOVENOX) injection  30 mg Subcutaneous Q24H  . hydrALAZINE  10 mg Intravenous Once  . mouth rinse  15 mL Mouth Rinse BID  . mupirocin cream   Topical Daily  . piperacillin-tazobactam (ZOSYN)  IV  3.375 g Intravenous Q8H  . [START ON 01/31/2017] vancomycin  1,250 mg Intravenous Q48H   Continuous Infusions: . dextrose 5 % 1,000 mL with potassium chloride 40 mEq infusion 100 mL/hr at 01/30/17 1040     LOS: 1 day      [facility], MD [facility] Pager [phone]  If 7PM-7AM, please contact night-coverage [email or web address] Password [facility] 01/30/2017, 12:31 PM

## 2017-01-30 NOTE — Care Management Note (Addendum)
Case Management Note  Patient Details  Name: Stann MainlandJohn Geary MRN: [id number] Date of Birth: 06/20/1933  Subjective/Objective:                 Spoke with Donnetta HutchingLinda Lesniewski at bedside. She is patient's ex DIL. Bonita QuinLinda lives in same apartment complex as patient and checks on hin 5 times a day and takes him to appointmnets. She states he lives in apartment with his son who travels for worked on extended trips 1-2 weeks at a time and then is home again for a few weeks. She states he wanders at night infrequently, and even less so since they stopped his zoloft. She is concerned with the amount of care that he will need at discharge since he has recently become incontinent of urine. She states patient cannot bathe by himself, and is also forgetful. Bonita QuinLinda provided with private duty list for reference to establish HHA if needed in the future, also [facility] [place] [facility] [place] to assist with [place]F placement if needed in the future. Family states they are open to SNF at DC. CM placed request to MD for PT eval, and updated CSw that patient is potential for SNF.   Action/Plan:  CM will continue to follow in conjunction with CSW.   Expected Discharge Date:                  Expected Discharge Plan:     In-House Referral:     Discharge planning Services  CM Consult  Post Acute Care Choice:    Choice offered to:     DME Arranged:    DME Agency:     HH Arranged:    HH Agency:     Status of Service:  In process, will continue to follow  If discussed at Long Length of Stay Meetings, dates discussed:    Additional Comments:  Lawerance SabalDebbie Kristyana Notte, RN 01/30/2017, 2:56 PM

## 2017-01-30 NOTE — Progress Notes (Signed)
While reviewing pt. Order for size18 urinary catheter, and to fill with 60 ml.  Text paged Dr. Algis Liming to clarify as well as to inform that when the in and out cath was done early with a small catheter, resistant was met.  Dr. Algis Liming stated that it was the recommendation from urology and to try.  To call back if unable.  Will gather supplies and attempt to place foley as ordered.  Alphonzo Lemmings, RN

## 2017-01-30 NOTE — Progress Notes (Signed)
BP 194/72, NP notified. No new orders. Will continue to monitor.

## 2017-01-30 NOTE — Progress Notes (Signed)
Pt has a foley catheter in place draining urine with bright red blood on call physician Craige Cottakirby was paged about it at [phone]  Put an order in for urinalysis will continue to monitor pt

## 2017-01-30 NOTE — Progress Notes (Signed)
Initial Nutrition Assessment  DOCUMENTATION CODES:   Not applicable  INTERVENTION:   -Ensure Enlive po daily, each supplement provides 350 kcal and 20 grams of protein -30 ml Prostat BID, each supplement provides 100 kcals and 15 grams protein -MVI daily  NUTRITION DIAGNOSIS:   Increased nutrient needs related to wound healing as evidenced by estimated needs.  GOAL:   Patient will meet greater than or equal to 90% of their needs  MONITOR:   PO intake, Supplement acceptance, Labs, Weight trends, Skin, I & O's  REASON FOR ASSESSMENT:   Low Braden    ASSESSMENT:   Scott Park is a 83 y.o. male with medical history significant of hypertension, dementia, sundown Syndrome, Who Presents with Right Foot Pain.  Pt admitted with rt foot cellulitis.   Pt unable to provide hx. No family present at time of visit. Unable to complete Nutrition-Focused physical exam at this time.   Reviewed CWOCN note from 01/29/17; pt with dry callous on rt heel and chronic full thickness wounds on lt plantar heel.   Lunch tray at bedside revealed less than 50% completion. Per doc flowsheets, pt has consumed 10-45% of meals.   Reviewed wt hx, which reveals progressive wt gain over the past 5 months. Noted that pt with edema in lower extremities, which may be contributing to wt changes.   Pt with increased nutritional needs related to wounds healing and would benefit from addition of MVI and supplements. RD to order.   Labs reviewed: Na: 146, K: 3.3 (on IV supplementation).   Diet Order:  Diet Heart Room service appropriate? Yes; Fluid consistency: Thin  Skin:  Wound (see comment) (full thickness lt plantar heel)  Last BM:  01/30/17  Height:   Ht Readings from Last 1 Encounters:  01/28/17 5\' 10"  (1.778 m)    Weight:   Wt Readings from Last 1 Encounters:  01/28/17 175 lb (79.4 kg)    Ideal Body Weight:  75.5 kg  BMI:  Body mass index is 25.11 kg/m.  Estimated Nutritional Needs:    Kcal:  1800-2000  Protein:  90-100 grams  Fluid:  > 1.8 L  EDUCATION NEEDS:   No education needs identified at this time  Scott Park, RD, LDN, CDE Pager: [phone] After hours Pager: [phone]

## 2017-01-31 LAB — URINALYSIS, ROUTINE W REFLEX MICROSCOPIC: Squamous Epithelial / LPF: NONE SEEN

## 2017-01-31 LAB — BASIC METABOLIC PANEL
Anion gap: 14 (ref 5–15)
BUN: 20 mg/dL (ref 6–20)
CO2: 24 mmol/L (ref 22–32)
CREATININE: 1.94 mg/dL — AB (ref 0.61–1.24)
Calcium: 8.8 mg/dL — ABNORMAL LOW (ref 8.9–10.3)
Chloride: 106 mmol/L (ref 101–111)
GFR, EST AFRICAN AMERICAN: 36 mL/min — AB (ref >60)
GFR, EST NON AFRICAN AMERICAN: 31 mL/min — AB (ref >60)
Glucose, Bld: 125 mg/dL — ABNORMAL HIGH (ref 65–99)
POTASSIUM: 3.5 mmol/L (ref 3.5–5.1)
SODIUM: 144 mmol/L (ref 135–145)

## 2017-01-31 LAB — CBC
HCT: 29.4 % — ABNORMAL LOW (ref 39.0–52.0)
Hemoglobin: 8.9 g/dL — ABNORMAL LOW (ref 13.0–17.0)
MCH: 23.9 pg — ABNORMAL LOW (ref 26.0–34.0)
MCHC: 30.3 g/dL (ref 30.0–36.0)
MCV: 79 fL (ref 78.0–100.0)
PLATELETS: 366 10*3/uL (ref 150–400)
RBC: 3.72 MIL/uL — AB (ref 4.22–5.81)
RDW: 15.3 % (ref 11.5–15.5)
WBC: 12.3 10*3/uL — AB (ref 4.0–10.5)

## 2017-01-31 LAB — URINALYSIS, MICROSCOPIC (REFLEX): Squamous Epithelial / LPF: NONE SEEN

## 2017-01-31 MED ORDER — AMLODIPINE BESYLATE 5 MG PO TABS
5.0000 mg | ORAL_TABLET | Freq: Every day | ORAL | Status: DC
  Administered 2017-02-01 – 2017-02-02 (×2): 5 mg via ORAL
  Filled 2017-01-31 (×2): qty 1

## 2017-01-31 MED ORDER — POTASSIUM CHLORIDE 2 MEQ/ML IV SOLN
INTRAVENOUS | Status: DC
  Administered 2017-01-31: 15:00:00 via INTRAVENOUS
  Filled 2017-01-31 (×2): qty 1000

## 2017-01-31 MED ORDER — DEXTROSE 5 % IV SOLN
1.0000 g | INTRAVENOUS | Status: DC
  Administered 2017-01-31 – 2017-02-01 (×2): 1 g via INTRAVENOUS
  Filled 2017-01-31 (×2): qty 10

## 2017-01-31 NOTE — Progress Notes (Signed)
Physical Therapy Evaluation Patient Details Name: Scott Park MRN: [id number] DOB: 03/11/1933 Today's Date: 01/31/2017   History of Present Illness  Pt presnts with cellulitis of the right foot and an acute kidney injury.    Clinical Impression  At baseline the patients Ex- Daughter in law reports the patient was walking without difficulty. At this time he requires mod a to stand and min a for all balance. Despite significant dementia the patient followed commands and was able to ambulate 18'. His mobility is significantly decreased from baseline. Her would benefit from further skilled therapy at the hospital as well as therapy at a SNF. He is a high fall risk.     Follow Up Recommendations SNF    Equipment Recommendations  Rolling walker with 5" wheels    Recommendations for Other Services       Precautions / Restrictions Restrictions Weight Bearing Restrictions: No      Mobility  Bed Mobility Overal bed mobility: Needs Assistance Bed Mobility: Supine to Sit;Sit to Supine     Supine to sit: Min assist Sit to supine: Min assist   General bed mobility comments: Min a to get legs and hips to the edge of the be. Once sitting min guard to remain sitting   Transfers Overall transfer level: Needs assistance   Transfers: Sit to/from Stand Sit to Stand: Mod assist;+2 physical assistance         General transfer comment: Sit to stand 3x with mod a (+) 2 for safety   Ambulation/Gait Ambulation/Gait assistance: Min assist;+2 physical assistance Ambulation Distance (Feet): 18 Feet Assistive device: Rolling walker (2 wheeled)       General Gait Details: Tactile cuing required for direction. Verbal cuing to stay in the walker. Min a for strength and balance.   Stairs            Wheelchair Mobility    Modified Rankin (Stroke Patients Only)       Balance                                             Pertinent Vitals/Pain Pain Assessment:  Faces Faces Pain Scale: Hurts a little bit Pain Location: right foot  Pain Descriptors / Indicators: Aching Pain Intervention(s): Limited activity within patient's tolerance;Monitored during session;Premedicated before session;Repositioned    Home Living Family/patient expects to be discharged to:: Skilled nursing facility Living Arrangements: Children Available Help at Discharge: Family Type of Home: House           Additional Comments: Patient lives at home withhis son. His son is out of town for weeks at a time. His ex-daughter in law iassists when she can but he is not there all the time.     Prior Function Level of Independence: Needs assistance   Gait / Transfers Assistance Needed: could walk but had falls   ADL's / Homemaking Assistance Needed: needed assistance with bathing         Hand Dominance   Dominant Hand: Right    Extremity/Trunk Assessment   Upper Extremity Assessment Upper Extremity Assessment: Difficult to assess due to impaired cognition    Lower Extremity Assessment Lower Extremity Assessment: Difficult to assess due to impaired cognition       Communication   Communication: Other (comment) (dementia)  Cognition   Behavior During Therapy: Anxious;Impulsive Overall Cognitive Status: History of cognitive impairments - at  baseline                 General Comments: Significant demsitia. Answered 25% of questioning approprietly. Could follow basic commands with verbal and tactile cuing     General Comments      Exercises     Assessment/Plan    PT Assessment Patient needs continued PT services  PT Problem List Decreased strength;Decreased range of motion;Decreased activity tolerance;Decreased balance;Decreased mobility;Decreased coordination;Decreased cognition;Decreased knowledge of use of DME;Decreased safety awareness;Pain          PT Treatment Interventions Gait training;DME instruction;Functional mobility training;Therapeutic  exercise;Therapeutic activities;Patient/family education;Balance training    PT Goals (Current goals can be found in the Care Plan section)  Acute Rehab PT Goals PT Goal Formulation: Patient unable to participate in goal setting    Frequency Min 3X/week   Barriers to discharge Decreased caregiver support Daughter in law only abel to be there at times     Co-evaluation               End of Session Equipment Utilized During Treatment: Gait belt Activity Tolerance: Patient limited by fatigue Patient left: in bed;with restraints reapplied;with family/visitor present;with bed alarm set;with call bell/phone within reach Nurse Communication: Mobility status         Time: 1340-1409 PT Time Calculation (min) (ACUTE ONLY): 29 min   Charges:   PT Evaluation $PT Eval High Complexity: 1 Procedure     PT G Codes:        Dessie Coma PT DPT  01/31/2017, 3:59 PM

## 2017-01-31 NOTE — Clinical Social Work Note (Signed)
Clinical Social Work Assessment  Patient Details  Name: Scott Park MRN: [id number] Date of Birth: 05/02/1933  Date of referral:  01/31/17               Reason for consult:  Facility Placement                Permission sought to share information with:  Facility Medical sales representativeContact Representative, Family Supports Permission granted to share information::  Yes, Verbal Permission Granted  Name::     Jonathon/Linda  Agency::  SNFs  Relationship::  Son/Ex-daughter in Diplomatic Services operational officerlaw  Contact Information:  [phone]  Housing/Transportation Living arrangements for the past 2 months:  Single Family Home Source of Information:  Adult Children Patient Interpreter Needed:  None Criminal Activity/Legal Involvement Pertinent to Current Situation/Hospitalization:  No - Comment as needed Significant Relationships:  Adult Children Lives with:  Adult Children Do you feel safe going back to the place where you live?  No Need for family participation in patient care:  Yes (Comment)  Care giving concerns:  CSW received consult for possible SNF placement at time of discharge. Patient is disoriented. CSW spoke with patient's son and ex-daughter-in-law, Bonita QuinLinda, regarding PT recommendation of SNF placement at time of discharge. Patient's son reported that he works and is currently unable to care for patient at their home given patient's current physical needs and fall risk. Patient's son expressed understanding of PT recommendation and is agreeable to SNF placement at time of discharge. CSW to continue to follow and assist with discharge planning needs.   Social Worker assessment / plan:  CSW spoke with patient's [facility].  Employment status:  Retired Health and safety inspectornsurance information:  Medicare PT Recommendations:  Skilled Nursing Facility Information / Referral to community resources:  Skilled Nursing Facility  Patient/Family's Response to care:  Patient's son recognizes need  for rehab before returning home and [place]. He requests that CSW contact [place] regarding facilities. Bonita QuinLinda reported [facility].  Patient/Family's Understanding of and Emotional Response to Diagnosis, Current Treatment, and Prognosis:  Patient/family is realistic regarding therapy needs and expressed being hopeful for SNF placement. Patient's son expressed understanding of CSW role and discharge process. No questions/concerns about plan or treatment.    Emotional Assessment Appearance:  Appears stated age Attitude/Demeanor/Rapport:  Unable to Assess Affect (typically observed):  Unable to Assess Orientation:  Oriented to Self Alcohol / Substance use:  Not Applicable Psych involvement (Current and /or in the community):  No (Comment)  Discharge Needs  Concerns to be addressed:  Care Coordination Readmission within the last 30 days:  No Current discharge risk:  None Barriers to Discharge:  Continued Medical Work up   [employer] S Abdifatah Colquhoun, LCSWA 01/31/2017, 4:30 PM

## 2017-01-31 NOTE — Progress Notes (Signed)
Pt doxycycline and multivit was put whole in apple sauce, he spill it out, I pulled another dose from pyxis crush in apple sauce pt swallowed one spoon and refused the rest of his med he also refused his pro-stat, I will continue to monitor

## 2017-01-31 NOTE — Progress Notes (Signed)
Pt's family member was able to convince him to take some of his morning medication.

## 2017-01-31 NOTE — Progress Notes (Addendum)
Pt refusing all medication. MD made aware. Will attempt later.

## 2017-01-31 NOTE — NC FL2 (Signed)
[place] MEDICAID FL2 LEVEL OF CARE SCREENING TOOL     IDENTIFICATION  Patient Name: Scott Park Birthdate: 07-Apr-1933 Sex: male Admission Date (Current Location): 01/28/2017  [facility] and [place] Number:  Producer, television/film/video and Address:  [employer] [facility], [address], [place], [place] [address]      Provider Number: [id number]  Attending Physician Name and Address:  Elease Etienne, MD  Relative Name and Phone Number:  Marja Kays, son [phone]    Current Level of Care: Hospital Recommended Level of Care: Skilled Nursing Facility Prior Approval Number:    Date Approved/Denied:   PASRR Number: [id number]  Discharge Plan: SNF    Current Diagnoses: Patient Active Problem List   Diagnosis Date Noted  . Pressure injury of skin 01/30/2017  . Bladder outlet obstruction 01/30/2017  . Bilateral hydronephrosis 01/30/2017  . Cellulitis of right foot 01/29/2017  . Normocytic anemia 01/29/2017  . AKI (acute kidney injury) (HCC) 01/29/2017  . Open wound of left heel 01/29/2017  . HTN (hypertension), benign 11/25/2016  . Dementia with behavioral disturbance 11/25/2016  . Pedal edema 11/25/2016    Orientation RESPIRATION BLADDER Height & Weight     Self  Normal Incontinent, Indwelling catheter Weight: 82.6 kg (182 lb) (Patient on low bed now.) Height:  5\' 10"  (177.8 cm)  BEHAVIORAL SYMPTOMS/MOOD NEUROLOGICAL BOWEL NUTRITION STATUS      Incontinent Diet (Please see DC Summary)  AMBULATORY STATUS COMMUNICATION OF NEEDS Skin   Extensive Assist Verbally Other (Comment) (Wound on foot; wound on ischial)                       Personal Care Assistance Level of Assistance  Bathing, Feeding, Dressing Bathing Assistance: Maximum assistance Feeding assistance: Limited assistance Dressing Assistance: Limited assistance     Functional Limitations Info             SPECIAL CARE FACTORS FREQUENCY  PT (By licensed PT)     PT Frequency:  5x/week              Contractures      Additional Factors Info  Code Status, Allergies Code Status Info: Full Allergies Info: Codeine, Hydroxyzine, Seroquel Quetiapine           Current Medications (01/31/2017):  This is the current hospital active medication list Current Facility-Administered Medications  Medication Dose Route Frequency Provider Last Rate Last Dose  . acetaminophen (TYLENOL) tablet 650 mg  650 mg Oral Q6H PRN Elease Etienne, MD   650 mg at 01/30/17 2206   Or  . acetaminophen (TYLENOL) suppository 650 mg  650 mg Rectal Q6H PRN Elease Etienne, MD      . Melene Muller ON 02/01/2017] amLODipine (NORVASC) tablet 5 mg  5 mg Oral Daily Elease Etienne, MD      . aspirin EC tablet 81 mg  81 mg Oral Daily Lorretta Harp, MD   81 mg at 01/31/17 1316  . atenolol (TENORMIN) tablet 25 mg  25 mg Oral Daily Lorretta Harp, MD   25 mg at 01/30/17 1125  . bisacodyl (DULCOLAX) EC tablet 5 mg  5 mg Oral Daily PRN Lorretta Harp, MD      . cefTRIAXone (ROCEPHIN) 1 g in dextrose 5 % 50 mL IVPB  1 g Intravenous Q24H Elease Etienne, MD   1 g at 01/31/17 1510  . dextrose 5 % 1,000 mL with potassium chloride 40 mEq infusion   Intravenous Continuous Theadora Rama  Irena Cords Hongalgi, MD 50 mL/hr at 01/31/17 1445    . doxycycline (VIBRA-TABS) tablet 100 mg  100 mg Oral Q12H Elease EtienneAnand D Hongalgi, MD   100 mg at 01/31/17 1317  . enoxaparin (LOVENOX) injection 30 mg  30 mg Subcutaneous Q24H Elease EtienneAnand D Hongalgi, MD   30 mg at 01/30/17 1126  . feeding supplement (ENSURE ENLIVE) (ENSURE ENLIVE) liquid 237 mL  237 mL Oral Q24H Elease EtienneAnand D Hongalgi, MD   237 mL at 01/30/17 1630  . feeding supplement (PRO-STAT SUGAR FREE 64) liquid 30 mL  30 mL Oral BID Elease EtienneAnand D Hongalgi, MD      . haloperidol lactate (HALDOL) injection 2 mg  2 mg Intravenous Q6H PRN Leda GauzeKaren J Kirby-Graham, NP   2 mg at 01/31/17 0015  . hydrALAZINE (APRESOLINE) injection 10 mg  10 mg Intravenous Once Leda GauzeKaren J Kirby-Graham, NP      . MEDLINE mouth rinse  15 mL Mouth Rinse BID  Elease EtienneAnand D Hongalgi, MD   15 mL at 01/30/17 2200  . multivitamin with minerals tablet 1 tablet  1 tablet Oral Daily Elease EtienneAnand D Hongalgi, MD   1 tablet at 01/30/17 2200  . mupirocin cream (BACTROBAN) 2 %   Topical Daily Elease EtienneAnand D Hongalgi, MD      . ondansetron [facility](ZOFRAN) tablet 4 mg  4 mg Oral Q6H PRN Lorretta HarpXilin Niu, MD       Or  . ondansetron [facility](ZOFRAN) injection 4 mg  4 mg Intravenous Q6H PRN Lorretta HarpXilin Niu, MD      . tamsulosin (FLOMAX) capsule 0.4 mg  0.4 mg Oral Daily Elease EtienneAnand D Hongalgi, MD   0.4 mg at 01/31/17 1318     Discharge Medications: Please see discharge summary for a list of discharge medications.  Relevant Imaging Results:  Relevant Lab Results:   Additional Information SSN: [id number]3452  Nicholaos Schippers S Logan[place]yan, [place]

## 2017-01-31 NOTE — Progress Notes (Signed)
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 Seen by a podiatrist on 01/24/17 and started on Keflex without much improvement. Seen by PCP on day of admission who recommended ED visit. Admitted for cellulitis of right foot. Cellulitis improving. Acute urinary retention and worsening renal insufficiency.   Assessment & Plan:   Principal Problem:   Cellulitis of right foot Active Problems:   HTN (hypertension), benign   Dementia with behavioral disturbance   Normocytic anemia   AKI (acute kidney injury) (HCC)   Open wound of left heel   Pressure injury of skin   Bladder outlet obstruction   Bilateral hydronephrosis   1. Right foot cellulitis: Complicating prior complex surgically altered R leg/foot anatomy. Failed outpatient oral antibiotic treatment. Treated empirically with IV vancomycin and Zosyn. Follow blood cultures - negative to date.Marland Kitchen ABIs technically limited. Right and left ABI of 1.44 suggestive of medial calcification of the interrogated arteries. Monitor closely. CRP 6.3. Cellulitis clinically resolved.DC'd IV antibiotics and transitioned to oral doxycycline on 01/30/17.  2. Left heel wound: Management per WOC RN consultation on 01/29/17.  3. Essential hypertension: uncontrolled. Continue atenolol. Added amlodipine 2.5 MG daily on 2/8, will increase to 5 MG daily.  4. Dementia with behavioral disturbance: Intermittent agitation, better after Haldol. No agitation this morning.  5. Normocytic anemia: Follow CBCs and transfuse if hemoglobin <7 g per DL.  Hemoglobin was 11.7 on 11/25/16 which dropped to 8.8 on admission and 7.7on 2/7. No overt bleeding. Hemoglobin 8.8/stable.  6. Acute kidney injury secondary to bladder outlet obstruction resulting in bilateral hydronephrosis: Normal creatinine of 0.82 on 11/25/16. Presented with creatinine of 1.65. Creatinine increased to 2.28. Bladder scan 2/8 confirmed residual volume >900 ML. Start Flomaxed. Discussed with Urology on call 2/8> who recommended putting in a Foley catheter 18 [place] with 30 mL balloon and filling it up with 60 mL so that patient will not be able to pull it out and leave the catheter at discharge until outpatient follow-up with them in 1 week for voiding trial in the office + start Flomax. Renal ultrasound: Moderate bilateral hydronephrosis and bladder outlet obstruction. Creatinine slightly better at 1.94. Mild hematuria likely traumatic related to Foley catheter. Continue management. Follow BMP. May need repeat renal ultrasound-can do outpatient with urology.? UTI based on urine microscopy. Check urine culture. Add IV ceftriaxone pending urine culture results.  7. Hypokalemia: Better after replacing and IV fluids.    DVT prophylaxis: Lovenox Code Status: Full Family Communication: None at bedside. Multiple attempts to call patient's son on 2/8 and again 2/9 were unsuccessful. Disposition Plan: To be determined, likely home when medically stable.   Consultants:   None  Procedures:   Foley catheter 2/8 >  Antimicrobials:   IV vancomycin-discontinued  IV Zosyn-discontinued  Oral doxycycline 2/8 >    Subjective: Pleasantly confused but not agitated this morning. Oriented only to self. Initially refused to take a.m. medications but subsequently took it with help from family. As per RN, no acute issues.  Objective:  Vitals:   01/31/17 0540 01/31/17 0629 01/31/17 1021 01/31/17  1022  BP: (!) 173/62 (!) 159/58 (!) 163/63 (!) 160/73  Pulse: 89 87 88   Resp: 20 18      Temp: 97.8 F (36.6 C)     TempSrc:      SpO2: 95% 97%    Weight:      Height:        Intake/Output Summary (Last 24 hours) at 01/31/17 1344 Last data filed at 01/31/17 1104  Gross per 24 hour  Intake             2140 ml  Output             5025 ml  Net            -2885 ml   Filed Weights   01/28/17 1527 01/30/17 2135  Weight: 79.4 kg (175 lb) 82.6 kg (182 lb)    Examination:  General exam: Pleasant elderly male sitting up comfortably in bed Respiratory system: Clear to auscultation. Respiratory effort normal. Cardiovascular system: S1 & S2 heard, RRR. No JVD, murmurs, rubs, gallops or clicks. No pedal edema. Gastrointestinal system: Abdomen is nondistended, soft and nontender. No organomegaly or masses felt. Normal bowel sounds heard. Central nervous system: alert and oriented to self. No focal neurological deficits. Extremities: Moves all limbs symmetrically. Right lower leg and foot deformed from prior healed surgical scars/skin graft. No further erythema, increased warmth or tenderness. No open wounds, fluctuation or crepitus to right leg/foot. Clean ulcer left heel. Skin: As per WOC RN evaluation on 01/29/17 Psychiatry: Judgement and insight impaired.    Data Reviewed: I have personally reviewed following labs and imaging studies  CBC:  Recent Labs Lab 01/28/17 1535 01/29/17 0158 01/30/17 0440 01/31/17 0516  WBC 9.4 10.1 12.6* 12.3*  NEUTROABS 6.9  --   --   --   HGB 8.8* 7.7* 8.8* 8.9*  HCT 29.5* 25.9* 29.6* 29.4*  MCV 79.7 79.7 79.6 79.0  PLT 378 318 404* 366   Basic Metabolic Panel:  Recent Labs Lab 01/28/17 1535 01/29/17 0158 01/30/17 0440 01/31/17 0516  NA 145 145 146* 144  K 3.5 3.8 3.3* 3.5  CL 109 106 107 106  CO2 29 26 23 24   GLUCOSE 95 92 112* 125*  BUN 16 17 20 20   CREATININE 1.65* 1.71* 2.28* 1.94*  CALCIUM 8.7* 8.2* 8.7* 8.8*   GFR: Estimated Creatinine Clearance: 30.8 mL/min (by C-G formula based on SCr of 1.94 mg/dL (H)). Liver  Function Tests:  Recent Labs Lab 01/28/17 1535  AST 21  ALT 11*  ALKPHOS 45  BILITOT 0.5  PROT 6.2*  ALBUMIN 3.0*   No results for input(s): LIPASE, AMYLASE in the last 168 hours. No results for input(s): AMMONIA in the last 168 hours. Coagulation Profile: No results for input(s): INR, PROTIME in the last 168 hours. Cardiac Enzymes: No results for input(s): CKTOTAL, CKMB, CKMBINDEX, TROPONINI in the last 168 hours. BNP (last 3 results) No results for input(s): PROBNP in the last 8760 hours. HbA1C: No results for input(s): HGBA1C in the last 72 hours. CBG: No results for input(s): GLUCAP in the last 168 hours. Lipid Profile: No results for input(s): CHOL, HDL, LDLCALC, TRIG, CHOLHDL, LDLDIRECT in the last 72 hours. Thyroid Function Tests: No results for input(s): TSH, T4TOTAL, FREET4, T3FREE, THYROIDAB in the last 72 hours. Anemia Panel: No results for input(s): VITAMINB12, FOLATE, FERRITIN, TIBC, IRON, RETICCTPCT in the last 72 hours.  Sepsis Labs:  Recent Labs Lab 01/28/17 1535 01/28/17 1550 01/28/17 2110 01/29/17 0158  01/30/17 0440 01/31/17 0516  WBC 9.4  --   --  10.1 12.6* 12.3*  LATICACIDVEN  --  0.82 1.15  --   --   --     Recent Results (from the past 240 hour(s))  Blood culture (routine x 2)     Status: None (Preliminary result)   Collection Time: 01/29/17 12:02 AM  Result Value Ref Range Status   Specimen Description BLOOD LEFT FOREARM  Final   Special Requests AEROBIC BOTTLE ONLY 5ML  Final   Culture NO GROWTH 1 DAY  Final   Report Status PENDING  Incomplete  Blood culture (routine x 2)     Status: None (Preliminary result)   Collection Time: 01/29/17 12:17 AM  Result Value Ref Range Status   Specimen Description BLOOD RIGHT WRIST  Final   Special Requests IN PEDIATRIC BOTTLE 3ML  Final   Culture NO GROWTH 1 DAY  Final   Report Status PENDING  Incomplete         Radiology Studies: [place] Renal  Result Date: 01/30/2017 CLINICAL DATA:  Acute  kidney injury. EXAM: RENAL / URINARY TRACT ULTRASOUND COMPLETE COMPARISON:  None. FINDINGS: Right Kidney: Length: 11.2 cm. Echogenicity is increased. Moderate hydronephrosis visualized. Left Kidney: Length: 10.8. Echogenicity is increased. Moderate hydronephrosis visualized. Bladder: Irregular, trabeculated appearance of the bladder wall is seen. IMPRESSION: Moderate bilateral hydronephrosis. Increased cortical echogenicity bilaterally consistent with medical renal disease. Trabeculated appearance of the urinary bladder wall suggestive of bladder outlet obstruction. Electronically Signed   By: Drusilla Kannerhomas  Dalessio M.D.   On: 01/30/2017 09:26        Scheduled Meds: . amLODipine  2.5 mg Oral Daily  . aspirin EC  81 mg Oral Daily  . atenolol  25 mg Oral Daily  . doxycycline  100 mg Oral Q12H  . enoxaparin (LOVENOX) injection  30 mg Subcutaneous Q24H  . feeding supplement (ENSURE ENLIVE)  237 mL Oral Q24H  . feeding supplement (PRO-STAT SUGAR FREE 64)  30 mL Oral BID  . hydrALAZINE  10 mg Intravenous Once  . mouth rinse  15 mL Mouth Rinse BID  . multivitamin with minerals  1 tablet Oral Daily  . mupirocin cream   Topical Daily  . tamsulosin  0.4 mg Oral Daily   Continuous Infusions:    LOS: 2 days      Jac[facility] Memorial Hospital & Care CenterNGALGI,Iya Hamed, MD [facility] Pager [phone]  If 7PM-7AM, please contact night-coverage [email or web address] Password [email or web address] 01/31/2017, 1:44 PM

## 2017-02-01 LAB — BASIC METABOLIC PANEL
Anion gap: 9 (ref 5–15)
BUN: 12 mg/dL (ref 6–20)
CHLORIDE: 106 mmol/L (ref 101–111)
CO2: 28 mmol/L (ref 22–32)
Calcium: 8.5 mg/dL — ABNORMAL LOW (ref 8.9–10.3)
Creatinine, Ser: 1.34 mg/dL — ABNORMAL HIGH (ref 0.61–1.24)
GFR calc Af Amer: 56 mL/min — ABNORMAL LOW (ref >60)
GFR calc non Af Amer: 48 mL/min — ABNORMAL LOW (ref >60)
Glucose, Bld: 108 mg/dL — ABNORMAL HIGH (ref 65–99)
POTASSIUM: 2.9 mmol/L — AB (ref 3.5–5.1)
SODIUM: 143 mmol/L (ref 135–145)

## 2017-02-01 LAB — MAGNESIUM: MAGNESIUM: 1.7 mg/dL (ref 1.7–2.4)

## 2017-02-01 LAB — URINE CULTURE: Culture: NO GROWTH

## 2017-02-01 MED ORDER — DIPHENHYDRAMINE HCL 25 MG PO CAPS
25.0000 mg | ORAL_CAPSULE | Freq: Every day | ORAL | Status: DC
  Administered 2017-02-01: 25 mg via ORAL
  Filled 2017-02-01: qty 1

## 2017-02-01 MED ORDER — HALOPERIDOL LACTATE 5 MG/ML IJ SOLN
1.0000 mg | Freq: Four times a day (QID) | INTRAMUSCULAR | Status: DC | PRN
  Administered 2017-02-02: 1 mg via INTRAVENOUS
  Filled 2017-02-01: qty 1

## 2017-02-01 MED ORDER — ENOXAPARIN SODIUM 40 MG/0.4ML ~~LOC~~ SOLN
40.0000 mg | SUBCUTANEOUS | Status: DC
  Administered 2017-02-02: 40 mg via SUBCUTANEOUS
  Filled 2017-02-01: qty 0.4

## 2017-02-01 MED ORDER — RISPERIDONE 0.5 MG PO TABS
0.5000 mg | ORAL_TABLET | Freq: Every day | ORAL | Status: DC
  Administered 2017-02-01: 0.5 mg via ORAL
  Filled 2017-02-01 (×2): qty 1

## 2017-02-01 MED ORDER — POTASSIUM CHLORIDE CRYS ER 20 MEQ PO TBCR
40.0000 meq | EXTENDED_RELEASE_TABLET | ORAL | Status: AC
  Administered 2017-02-01 (×2): 40 meq via ORAL
  Filled 2017-02-01 (×2): qty 2

## 2017-02-01 NOTE — Progress Notes (Signed)
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 Seen by a podiatrist on 01/24/17 and started on Keflex without much improvement. Seen by PCP on day of admission who recommended ED visit. Admitted for cellulitis of right foot. Cellulitis improving. Acute urinary retention and worsening renal insufficiency.   Assessment & Plan:   Principal Problem:   Cellulitis of right foot Active Problems:   HTN (hypertension), benign   Dementia with behavioral disturbance   Normocytic anemia   AKI (acute kidney injury) (HCC)   Open wound of left heel   Pressure injury of skin   Bladder outlet obstruction   Bilateral hydronephrosis   1. Right foot cellulitis: Complicating prior complex surgically altered R leg/foot anatomy. Failed outpatient oral antibiotic treatment. Treated empirically with IV vancomycin and Zosyn. Follow blood cultures - negative to date.Marland Kitchen. ABIs technically limited. Right and left ABI of 1.44 suggestive of medial calcification of the interrogated arteries. Monitor closely. CRP 6.3. Cellulitis clinically resolved.DC'd IV antibiotics and transitioned to oral doxycycline on 01/30/17.  2. Left heel wound: Management per WOC RN consultation on 01/29/17.  3. Essential hypertension: uncontrolled. Continue atenolol. Added amlodipine 2.5 MG daily on 2/8, will increase to 5 MG daily.  4. Dementia with behavioral disturbance: Intermittent agitation. Has been getting multiple doses of when necessary IV Haldol. Discussed with psychiatrist on call and as per recommendations will start  Risperidone 0.5 mg every evening and Benadryl 25 MG daily at bedtime at bedtime. Monitor.  5. Normocytic anemia: Follow CBCs and transfuse if hemoglobin <7 g per DL. Hemoglobin was 11.7 on 11/25/16 which dropped to 8.8 on admission and 7.7on 2/7. No overt bleeding. Hemoglobin 8.8/stable.  6. Acute kidney injury secondary to bladder outlet obstruction resulting in bilateral hydronephrosis: Normal creatinine of 0.82 on 11/25/16. Presented with creatinine of 1.65. Creatinine increased to 2.28. Bladder scan 2/8 confirmed residual volume >900 ML. Start Flomaxed. Discussed with Urology on call 2/8> who recommended putting in a Foley catheter 18 [place] with 30 mL balloon and filling it up with 60 mL so that patient will not be able to pull it out and leave the catheter at discharge until outpatient follow-up with them in 1 week for voiding trial in the office + start Flomax. Renal ultrasound: Moderate bilateral hydronephrosis and bladder outlet obstruction. Creatinine slightly better at 1.94. Mild hematuria likely traumatic related to Foley catheter. Continue management. May need repeat renal ultrasound-can do outpatient with urology. Much improved. Creatinine down to 1.34. DC IV fluids. Urine culture negative. DC Rocephin.  7. Hypokalemia: Replace aggressively and follow. Check magnesium.    DVT prophylaxis: Lovenox Code Status: Full Family Communication: None at bedside. Multiple attempts to call patient's son on 2/8 and again 2/9 were unsuccessful. Disposition Plan: To be determined, SNF possibly 2/11.   Consultants:   None  Procedures:   Foley catheter 2/8 >  Antimicrobials:   IV vancomycin-discontinued  IV Zosyn-discontinued  Oral doxycycline 2/8 >    Subjective: Pleasantly confused but not agitated this morning. He received 2 doses of IV Haldol yesterday.  Objective:  Vitals:  01/31/17 1548 01/31/17 2306 02/01/17 0545 02/01/17 1024  BP: (!) 147/61 (!) 162/58 (!) 151/66 (!)  141/76  Pulse: 84 91 91 91  Resp: 18 20 16    Temp: 98.5 F (36.9 C) 98.2 F (36.8 C) 98.8 F (37.1 C)   TempSrc:  Oral    SpO2: 98% 97% 99%   Weight:      Height:        Intake/Output Summary (Last 24 hours) at 02/01/17 1548 Last data filed at 02/01/17 1310  Gross per 24 hour  Intake             1540 ml  Output             3825 ml  Net            -2285 ml   Filed Weights   01/28/17 1527 01/30/17 2135  Weight: 79.4 kg (175 lb) 82.6 kg (182 lb)    Examination:  General exam: Pleasant elderly male sitting up comfortably in bed Respiratory system: Clear to auscultation. Respiratory effort normal. Cardiovascular system: S1 & S2 heard, RRR. No JVD, murmurs, rubs, gallops or clicks. No pedal edema. Gastrointestinal system: Abdomen is nondistended, soft and nontender. No organomegaly or masses felt. Normal bowel sounds heard.Foley catheter with mildly blood tinged urine. Central nervous system: alert and oriented to self. No focal neurological deficits. Extremities: Moves all limbs symmetrically. Right lower leg and foot deformed from prior healed surgical scars/skin graft. No further erythema, increased warmth or tenderness. No open wounds, fluctuation or crepitus to right leg/foot. Clean ulcer left heel. Skin: As per WOC RN evaluation on 01/29/17 Psychiatry: Judgement and insight impaired.    Data Reviewed: I have personally reviewed following labs and imaging studies  CBC:  Recent Labs Lab 01/28/17 1535 01/29/17 0158 01/30/17 0440 01/31/17 0516  WBC 9.4 10.1 12.6* 12.3*  NEUTROABS 6.9  --   --   --   HGB 8.8* 7.7* 8.8* 8.9*  HCT 29.5* 25.9* 29.6* 29.4*  MCV 79.7 79.7 79.6 79.0  PLT 378 318 404* 366   Basic Metabolic Panel:  Recent Labs Lab 01/28/17 1535 01/29/17 0158 01/30/17 0440 01/31/17 0516 02/01/17 1045  NA 145 145 146* 144 143  K 3.5 3.8 3.3* 3.5 2.9*  CL 109 106 107 106 106  CO2 29 26 23 24 28   GLUCOSE 95 92 112* 125* 108*  BUN 16 17 20 20 12     CREATININE 1.65* 1.71* 2.28* 1.94* 1.34*  CALCIUM 8.7* 8.2* 8.7* 8.8* 8.5*   GFR: Estimated Creatinine Clearance: 44.6 mL/min (by C-G formula based on SCr of 1.34 mg/dL (H)). Liver Function Tests:  Recent Labs Lab 01/28/17 1535  AST 21  ALT 11*  ALKPHOS 45  BILITOT 0.5  PROT 6.2*  ALBUMIN 3.0*   No results for input(s): LIPASE, AMYLASE in the last 168 hours. No results for input(s): AMMONIA in the last 168 hours. Coagulation Profile: No results for input(s): INR, PROTIME in the last 168 hours. Cardiac Enzymes: No results for input(s): CKTOTAL, CKMB, CKMBINDEX, TROPONINI in the last 168 hours. BNP (last 3 results) No results for input(s): PROBNP in the last 8760 hours. HbA1C: No results for input(s): HGBA1C in the last 72 hours. CBG: No results for input(s): GLUCAP in the last 168 hours. Lipid Profile: No results for input(s): CHOL, HDL, LDLCALC, TRIG, CHOLHDL, LDLDIRECT in the last 72 hours. Thyroid Function Tests: No results for input(s): TSH, T4TOTAL, FREET4, T3FREE, THYROIDAB in the last 72 hours. Anemia Panel: No  results for input(s): VITAMINB12, FOLATE, FERRITIN, TIBC, IRON, RETICCTPCT in the last 72 hours.  Sepsis Labs:  Recent Labs Lab 01/28/17 1535 01/28/17 1550 01/28/17 2110 01/29/17 0158 01/30/17 0440 01/31/17 0516  WBC 9.4  --   --  10.1 12.6* 12.3*  LATICACIDVEN  --  0.82 1.15  --   --   --     Recent Results (from the past 240 hour(s))  Blood culture (routine x 2)     Status: None (Preliminary result)   Collection Time: 01/29/17 12:02 AM  Result Value Ref Range Status   Specimen Description BLOOD LEFT FOREARM  Final   Special Requests AEROBIC BOTTLE ONLY  Final   Culture NO GROWTH 3 DAYS  Final   Report Status PENDING  Incomplete  Blood culture (routine x 2)     Status: None (Preliminary result)   Collection Time: 01/29/17 12:17 AM  Result Value Ref Range Status   Specimen Description BLOOD RIGHT WRIST  Final   Special Requests IN  PEDIATRIC BOTTLE  Final   Culture NO GROWTH 3 DAYS  Final   Report Status PENDING  Incomplete  Culture, Urine     Status: None   Collection Time: 01/31/17  2:54 PM  Result Value Ref Range Status   Specimen Description URINE, RANDOM  Final   Special Requests NONE  Final   Culture NO GROWTH  Final   Report Status 02/01/2017 FINAL  Final         Radiology Studies: No results found.      Scheduled Meds: . amLODipine  5 mg Oral Daily  . aspirin EC  81 mg Oral Daily  . atenolol  25 mg Oral Daily  . cefTRIAXone (ROCEPHIN)  IV  1 g Intravenous Q24H  . diphenhydrAMINE  25 mg Oral QHS  . doxycycline  100 mg Oral Q12H  . [START ON 02/02/2017] enoxaparin (LOVENOX) injection  40 mg Subcutaneous Q24H  . feeding supplement (ENSURE ENLIVE)  237 mL Oral Q24H  . feeding supplement (PRO-STAT SUGAR FREE 64)  30 mL Oral BID  . hydrALAZINE  10 mg Intravenous Once  . mouth rinse  15 mL Mouth Rinse BID  . multivitamin with minerals  1 tablet Oral Daily  . mupirocin cream   Topical Daily  . potassium chloride  40 mEq Oral Q4H  . risperiDONE  0.5 mg Oral q1800  . tamsulosin  0.4 mg Oral Daily   Continuous Infusions:    LOS: 3 days      [facility], MD [facility] Pager [phone]  If 7PM-7AM, please contact night-coverage [email or web address] Password [facility] 02/01/2017, 3:48 PM

## 2017-02-02 LAB — BASIC METABOLIC PANEL
ANION GAP: 10 (ref 5–15)
BUN: 16 mg/dL (ref 6–20)
CHLORIDE: 103 mmol/L (ref 101–111)
CO2: 29 mmol/L (ref 22–32)
Calcium: 8.6 mg/dL — ABNORMAL LOW (ref 8.9–10.3)
Creatinine, Ser: 1.2 mg/dL (ref 0.61–1.24)
GFR calc non Af Amer: 55 mL/min — ABNORMAL LOW (ref >60)
GLUCOSE: 99 mg/dL (ref 65–99)
POTASSIUM: 3.2 mmol/L — AB (ref 3.5–5.1)
Sodium: 142 mmol/L (ref 135–145)

## 2017-02-02 MED ORDER — PRO-STAT SUGAR FREE PO LIQD: 30.0000 mL | Freq: Two times a day (BID) | ORAL | Status: AC

## 2017-02-02 MED ORDER — POTASSIUM CHLORIDE CRYS ER 20 MEQ PO TBCR
40.0000 meq | EXTENDED_RELEASE_TABLET | ORAL | Status: AC
  Administered 2017-02-02: 40 meq via ORAL
  Filled 2017-02-02: qty 2

## 2017-02-02 MED ORDER — MUPIROCIN CALCIUM 2 % EX CREA: TOPICAL_CREAM | Freq: Every day | CUTANEOUS | Status: DC

## 2017-02-02 MED ORDER — TAMSULOSIN HCL 0.4 MG PO CAPS: 0.4000 mg | ORAL_CAPSULE | Freq: Every day | ORAL | Status: DC

## 2017-02-02 MED ORDER — DOXYCYCLINE HYCLATE 100 MG PO TABS: 100.0000 mg | ORAL_TABLET | Freq: Two times a day (BID) | ORAL | Status: DC

## 2017-02-02 MED ORDER — ADULT MULTIVITAMIN W/MINERALS CH: 1.0000 | ORAL_TABLET | Freq: Every day | ORAL | Status: DC

## 2017-02-02 MED ORDER — RISPERIDONE 0.5 MG PO TABS: 0.5000 mg | ORAL_TABLET | Freq: Every day | ORAL | Status: DC

## 2017-02-02 MED ORDER — AMLODIPINE BESYLATE 5 MG PO TABS: 5.0000 mg | ORAL_TABLET | Freq: Every day | ORAL | Status: DC

## 2017-02-02 MED ORDER — ENSURE ENLIVE PO LIQD: 237.0000 mL | ORAL | Status: DC

## 2017-02-02 NOTE — Progress Notes (Signed)
CSW was informed by pt's RN that pt's doctor was D/C'ing pt on 2/11.  CSW informed pt's son at [phone] and ex-daughter-in-law Bonita QuinLinda at ph: [phone] pt was discharging and informed them of available beds. Family chose [facility].  CS[facility] [place] [facility] at ph: [phone] who will verify pt's insurance MEDICARE/MEDICARE PART A AND B is primary and call CSW back.  CSW awaiting return call from Tammy and will inform family of outcome.   MEDICARE/MEDICARE PART A AND B

## 2017-02-02 NOTE — Progress Notes (Signed)
Physical Therapy Treatment Patient Details Name: Scott Park MRN: [id number] DOB: [birth year] Today's Date: 02/02/2017    History of Present Illness Pt presnts with cellulitis of the right foot and an acute kidney injury.   has a past medical history of Hypertension and SunDown syndrome with history of dementia;  has a past surgical history that includes Tibia fracture surgery and Bypass Graft (Right).    PT Comments    Pt with decr ability to participate today; Mr. Frontera was initially agreeable to walk, but then became restless; he was able to participate better during PT eval when a family member was present; Prior to this admission he was able to walk without difficulty, and I continue to agree that SNF for rehab will be best to help pt back to his PLOF  Follow Up Recommendations  SNF     Equipment Recommendations  Rolling walker with 5" wheels    Recommendations for Other Services       Precautions / Restrictions Precautions Precautions: Fall Restrictions Weight Bearing Restrictions: (P) No    Mobility  Bed Mobility Overal bed mobility: Needs Assistance Bed Mobility: Supine to Sit;Sit to Supine     Supine to sit: Min assist Sit to supine: Max assist   General bed mobility comments: Min a to get legs and hips to the edge of the be. Once sitting min guard to remain sitting; Became restless sitting up and then declined amb; max assist to get back into the bed  Transfers Overall transfer level: Needs assistance Equipment used: 2 person hand held assist Transfers: Sit to/from Stand Sit to Stand: Mod assist;+2 physical assistance         General transfer comment: stood 2X with bilateral support and handheld assist; mod assist to power up, did not quite acheive fully upright standing, then would sit down  Ambulation/Gait                 Stairs            Wheelchair Mobility    Modified Rankin (Stroke Patients Only)       Balance                                     Cognition   Behavior During Therapy: Anxious;Impulsive Overall Cognitive Status: History of cognitive impairments - at baseline                 General Comments: Initially agreeable to get up; once up on EOB, refusing amb    Exercises      General Comments General comments (skin integrity, edema, etc.): Pt had been incontinent of stool      Pertinent Vitals/Pain Pain Assessment: Faces Pain Score: 2  Pain Location: right foot  Pain Descriptors / Indicators: Grimacing ("don't touch my foot") Pain Intervention(s): Repositioned    Home Living                      Prior Function            PT Goals (current goals can now be found in the care plan section) Acute Rehab PT Goals Patient Stated Goal: Inititally agreeable to get up and walk, then not agreeable PT Goal Formulation: Patient unable to participate in goal setting Progress towards PT goals: Not progressing toward goals - comment (less ability to participate today)    Frequency    Min  3X/week      PT Plan Current plan remains appropriate    Co-evaluation             End of Session Equipment Utilized During Treatment:  (REfused gait belt) Activity Tolerance: Treatment limited secondary to agitation Patient left: in bed;with call bell/phone within reach;with nursing/sitter in room     Time: [id number] PT Time Calculation (min) (ACUTE ONLY): 16 min  Charges:  $Therapeutic Activity: 8-22 mins                    G Codes:      Levi AlandHolly H Dallas Torok 02/02/2017, 12:46 PM  Van ClinesHolly Mearle Drew, PT  Acute Rehabilitation Services Pager [phone] Office [phone]

## 2017-02-02 NOTE — Clinical Social Work Placement (Signed)
   CLINICAL SOCIAL WORK PLACEMENT  NOTE  Date:  02/02/2017  Patient Details  Name: Scott Park MRN: [id number] Date of Birth: 09/08/1933  Clinical Social Work is seeking post-discharge placement for this patient at the Skilled  Nursing Facility level of care (*CSW will initial, date and re-position this form in  chart as items are completed):  Yes   Patient/family provided with [facility] Clinical Social Work Department's list of facilities offering this level of care within the geographic area requested by the patient (or if unable, by the patient's family).  Yes   Patient/family informed of their freedom to choose among providers that offer the needed level of care, that participate in Medicare, Medicaid or managed care program needed by the patient, have an available bed and are willing to accept the patient.  Yes   Patient/family informed of 's ownership interest in [facility] and [facility], as well as of the fact that they are under no obligation to receive care at these facilities.  PASRR submitted to EDS on 01/31/17     PASRR number received on 01/31/17     Existing PASRR number confirmed on       FL2 transmitted to all facilities in geographic area requested by pt/family on 01/31/17     FL2 transmitted to all facilities within larger geographic area on 01/31/17     Patient informed that his/her managed care company has contracts with or will negotiate with certain facilities, including the following:   ([facility])     Yes   Patient/family informed of bed offers received.  Patient chooses bed at  [facility])     Physician recommends and patient chooses bed at  [facility])    Patient to be transferred to  [facility]) on 02/02/17.  Patient to be transferred to facility by  Sh[facility] Mons([facility])     Patient family notified on 02/02/17 of transfer.  Name of family member notified:  Bonita QuinLinda pt's  ex-DIL  at ph: [phone]     PHYSICIAN Please prepare priority discharge summary, including medications     Additional Comment:    _______________________________________________ Mercy RidingJonathan F Fenna Semel, LCSWA 02/02/2017, 12:27 PM

## 2017-02-02 NOTE — Discharge Summary (Signed)
Physician Discharge Summary  [place] Park AVW:[id number] DOB: 03-04-1933  PCP: Pete Glatter, MD  Admit date: 01/28/2017 Discharge date: 02/02/2017  Recommendations for Outpatient Follow-up:  1. M.D. at SNF in 1 day with repeat labs (CBC & BMP). Please follow final blood culture results that were sent from the hospital. 2. Dr. Ihor Gully, [facility] in 1 week for evaluation and management of urinary retention. SNF to coordinate. 3. Dr. Dierdre Searles, PCP upon discharge from SNF.  Home Health: None Equipment/Devices: Indwelling Foley catheter    Discharge Condition: Improved and stable  CODE STATUS: Full  Diet recommendation: Heart healthy diet.  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 Seen by a podiatrist on 01/24/17 and started on Keflex without much improvement. Seen by PCP on day of admission who recommended ED visit. Admitted for cellulitis of right foot. Cellulitis resolved. Acute urinary retention and worsening renal insufficiency >improved.   Assessment & Plan:   1. Right foot cellulitis: Complicating prior complex surgically altered R leg/foot anatomy. Failed outpatient oral antibiotic treatment. Treated empirically with IV vancomycin and Zosyn. Follow blood cultures - negative to date.Marland Kitchen ABIs technically limited. Right  and left ABI of 1.44 suggestive of medial calcification of the interrogated arteries. CRP 6.3. Cellulitis clinically resolved. DC'd IV antibiotics and transitioned to oral doxycycline on 01/30/17. Complete total one-week of antibiotics on 02/03/17.  2. Left heel wound: Management per WOC RN consultation on 01/29/17. Instructions as outlined below.  3. Essential hypertension: uncontrolled. Continue atenolol. Added amlodipine 5 MG daily. Follow and adjust as needed at SNF.  4. Dementia with behavioral disturbance: Intermittent agitation. Had been getting multiple doses of when necessary IV Haldol. Discussed with psychiatrist on call on 02/01/17 and as per recommendations started Risperidone 0.5 mg every evening and Benadryl 25 MG daily at bedtime at bedtime. Slightly somnolent this morning but cleared up during the course of the day. As discussed with former daughter-in-law, patient has advanced dementia and current mental status, probably due to acute illness, hospitalization, change in sleep cycle. Continue risperidone at bedtime but will stop Benadryl. Further management at SNF as deemed appropriate.  5. Normocytic anemia: Follow CBCs and transfuse if hemoglobin <7 g per DL. Hemoglobin was 11.7 on 11/25/16 which dropped to 8.8 on admission and 7.7on 2/7. No overt bleeding. Hemoglobin 8.8/stable.  6. Acute kidney injury secondary to bladder outlet obstruction resulting in bilateral hydronephrosis: Normal creatinine of 0.82 on 11/25/16. Presented with creatinine of 1.65. Creatinine increased to 2.28. Bladder scan 2/8 confirmed residual volume >900 ML. Start Flomaxed. Discussed with Urology on call 2/8> who recommended putting in a Foley catheter 18 [place] with 30 mL balloon and filling it up with 60 mL so that patient will not be able to pull it out and leave the catheter at discharge until outpatient follow-up with them in 1 week  for voiding trial in the office + start Flomax. Renal ultrasound: Moderate  bilateral hydronephrosis and bladder outlet obstruction. Creatinine slightly better at 1.94. Mild hematuria likely traumatic related to Foley catheter. Continue management. May need repeat renal ultrasound-can do outpatient with urology. Much improved. Creatinine down to 1.20. DC IV fluids. Urine culture negative. DC Rocephin.  7. Hypokalemia:  magnesium 1.7. Replaced potassium aggressively prior to discharge today. Follow BMP at SNF and replace as needed.      Consultants:   None  Procedures:   Foley catheter 2/8 >    Discharge Instructions  Discharge Instructions    Call MD for:  difficulty breathing, headache or visual disturbances    Complete by:  As directed    Call MD for:  extreme fatigue    Complete by:  As directed    Call MD for:  persistant dizziness or light-headedness    Complete by:  As directed    Call MD for:  severe uncontrolled pain    Complete by:  As directed    Call MD for:  temperature >100.4    Complete by:  As directed    Diet - low sodium heart healthy    Complete by:  As directed    Discharge instructions    Complete by:  As directed    Continue indwelling Foley catheter at discharge until outpatient consultation with urology in 1 week after discharge.   Discharge wound care:    Complete by:  As directed    Wound Care instructions as per WOC RN as below:  Dressing procedure/placement/frequency: Bactroban to promote moist healing to left heel and right leg scabbed areas.  Float heels to reduce pressure.  Pt should resume follow-up with podiatrist after discharge. No family present to discuss plan of care and patient does not appear to understand.   Increase activity slowly    Complete by:  As directed        Medication List    STOP taking these medications   cephALEXin 500 MG capsule Commonly known as:  KEFLEX     TAKE these medications   amLODipine 5 MG tablet Commonly known as:  NORVASC Take 1 tablet (5 mg total) by mouth  daily. Start taking on:  02/03/2017   aspirin EC 81 MG tablet Take 81 mg by mouth daily.   atenolol 25 MG tablet Commonly known as:  TENORMIN Take 1 tablet (25 mg total) by mouth daily.   bisacodyl 5 MG EC tablet Commonly known as:  DULCOLAX Take 5 mg by mouth daily as needed for moderate constipation.   doxycycline 100 MG tablet Commonly known as:  VIBRA-TABS Take 1 tablet (100 mg total) by mouth 2 (two) times daily. Discontinue after 02/03/17 doses.   feeding supplement (ENSURE ENLIVE) Liqd Take 237 mLs by mouth daily.   feeding supplement (PRO-STAT SUGAR FREE 64) Liqd Take 30 mLs by mouth 2 (two) times daily.   multivitamin with minerals Tabs tablet Take 1 tablet by mouth daily. Start taking on:  02/03/2017   mupirocin cream 2 % Commonly known as:  BACTROBAN Apply topically daily. Apply Bactroban to left foot wound Q day, then cover with foam dressing. (Change foam dressing Q 3 days or PRN soiling.)  Apply Bactroban to right leg scabbed areas Q day, leave open to air Start taking on:  02/03/2017   risperiDONE 0.5 MG tablet Commonly known as:  RISPERDAL Take 1 tablet (0.5 mg total) by mouth daily at 6 PM.  tamsulosin 0.4 MG Caps capsule Commonly known as:  FLOMAX Take 1 capsule (0.4 mg total) by mouth daily. Start taking on:  02/03/2017      Follow-up Information    M.D. at SNF. Schedule an appointment as soon as possible for a visit in 1 day(s).   Why:  To be seen with repeat labs (CBC & BMP).       Pete Glatter, MD. Schedule an appointment as soon as possible for a visit.   Specialty:  Internal Medicine Why:  Upon discharge from SNF. Contact information: [address] [place] [address] [phone]        Garnett Farm, MD. Schedule an appointment as soon as possible for a visit in 1 week(s).   Specialty:  Urology Why:  Follow-up regarding acute urinary retention evaluation and management. Keep indwelling Foley catheter until follow-up with  urology. Contact information: [address] [place] [address] [phone]          Allergies  Allergen Reactions  . Codeine Anxiety  . Hydroxyzine Other (See Comments)    Makes things worse  . Seroquel [Quetiapine] Other (See Comments)    Makes things worse     Procedures/Studies: US Renal  Result Date: 01/30/2017 CLINICAL DATA:  Acute kidney injury. EXAM: RENAL / URINARY TRACT ULTRASOUND COMPLETE COMPARISON:  None. FINDINGS: Right Kidney: Length: 11.2 cm. Echogenicity is increased. Moderate hydronephrosis visualized. Left Kidney: Length: 10.8. Echogenicity is increased. Moderate hydronephrosis visualized. Bladder: Irregular, trabeculated appearance of the bladder wall is seen. IMPRESSION: Moderate bilateral hydronephrosis. Increased cortical echogenicity bilaterally consistent with medical renal disease. Trabeculated appearance of the urinary bladder wall suggestive of bladder outlet obstruction. Electronically Signed   By: Drusilla Kanner M.D.   On: 01/30/2017 09:26      Subjective: Pleasantly confused this morning. Received risperidone at approximately 5 PM yesterday and Benadryl around 11 PM (earlier and later than ordered, respectively) and received a dose of Haldol at approximately 4 AM. Slightly somnolent and pleasantly confused this morning without agitation. Progressively became alert. As per RN, no other acute issues reported. Cooperative with medications and food.  Discharge Exam:  Vitals:   02/01/17 1024 02/01/17 1552 02/01/17 2139 02/02/17 0544  BP: (!) 141/76 92/68 (!) 154/64 (!) 173/69  Pulse: 91 84 88 100  Resp:  16 18 18   Temp:  98.4 F (36.9 C) 98.3 F (36.8 C) 97.4 F (36.3 C)  TempSrc:  Oral Oral Oral  SpO2:  96% 100% 100%  Weight:      Height:        General exam: Pleasant elderly male sitting up comfortably in bed Respiratory system: Clear to auscultation. Respiratory effort normal. Cardiovascular system: S1 & S2 heard, RRR. No JVD,  murmurs, rubs, gallops or clicks. No pedal edema. Gastrointestinal system: Abdomen is nondistended, soft and nontender. No organomegaly or masses felt. Normal bowel sounds heard.Foley catheter with mildly blood tinged urine-progressively improving.. Central nervous system: alert and oriented to self. No focal neurological deficits. Extremities: Moves all limbs symmetrically. Right lower leg and foot deformed from prior healed surgical scars/skin graft. No further erythema, increased warmth or tenderness. No open wounds, fluctuation or crepitus to right leg/foot. Clean ulcer left heel. Skin: As per WOC RN evaluation on 01/29/17 Psychiatry: Judgement and insight impaired.    The results of significant diagnostics from this hospitalization (including imaging, microbiology, ancillary and laboratory) are listed below for reference.     Microbiology: Recent Results (from the past 240 hour(s))  Blood culture (routine x 2)     Status: None (Preliminary result)   Collection Time: 01/29/17 12:02 AM  Result Value Ref Range Status   Specimen Description BLOOD LEFT FOREARM  Final   Special Requests AEROBIC BOTTLE ONLY  Final   Culture NO GROWTH 3 DAYS  Final   Report Status PENDING  Incomplete  Blood culture (routine x 2)     Status: None (Preliminary result)   Collection Time: 01/29/17 12:17 AM  Result Value Ref Range Status   Specimen Description BLOOD RIGHT WRIST  Final   Special Requests IN PEDIATRIC BOTTLE  Final   Culture NO GROWTH 3 DAYS  Final   Report Status PENDING  Incomplete  Culture, Urine     Status: None   Collection Time: 01/31/17  2:54 PM  Result Value Ref Range Status   Specimen Description URINE, RANDOM  Final   Special Requests NONE  Final   Culture NO GROWTH  Final   Report Status 02/01/2017 FINAL  Final     Labs: BNP (last 3 results)  Recent Labs  11/25/16 1508  BNP 149.5*   Basic Metabolic Panel:  Recent Labs Lab 01/29/17 0158 01/30/17 0440  01/31/17 0516 02/01/17 1041 02/01/17 1045 02/02/17 0500  NA 145 146* 144  --  143 142  K 3.8 3.3* 3.5  --  2.9* 3.2*  CL 106 107 106  --  106 103  CO2 26 23 24   --  28 29  GLUCOSE 92 112* 125*  --  108* 99  BUN 17 20 20   --  12 16  CREATININE 1.71* 2.28* 1.94*  --  1.34* 1.20  CALCIUM 8.2* 8.7* 8.8*  --  8.5* 8.6*  MG  --   --   --  1.7  --   --    Liver Function Tests:  Recent Labs Lab 01/28/17 1535  AST 21  ALT 11*  ALKPHOS 45  BILITOT 0.5  PROT 6.2*  ALBUMIN 3.0*   CBC:  Recent Labs Lab 01/28/17 1535 01/29/17 0158 01/30/17 0440 01/31/17 0516  WBC 9.4 10.1 12.6* 12.3*  NEUTROABS 6.9  --   --   --   HGB 8.8* 7.7* 8.8* 8.9*  HCT 29.5* 25.9* 29.6* 29.4*  MCV 79.7 79.7 79.6 79.0  PLT 378 318 404* 366   Urinalysis    Component Value Date/Time   COLORURINE RED (A) 01/31/2017 1205   APPEARANCEUR TURBID (A) 01/31/2017 1205   LABSPEC  01/31/2017 1205    TEST NOT REPORTED DUE TO COLOR INTERFERENCE OF URINE PIGMENT   PHURINE  01/31/2017 1205    TEST NOT REPORTED DUE TO COLOR INTERFERENCE OF URINE PIGMENT   GLUCOSEU (A) 01/31/2017 1205    TEST NOT REPORTED DUE TO COLOR INTERFERENCE OF URINE PIGMENT   HGBUR (A) 01/31/2017 1205    TEST NOT REPORTED DUE TO COLOR INTERFERENCE OF URINE PIGMENT   BILIRUBINUR (A) 01/31/2017 1205    TEST NOT REPORTED DUE TO COLOR INTERFERENCE OF URINE PIGMENT   KETONESUR (A) 01/31/2017 1205    TEST NOT REPORTED DUE TO COLOR INTERFERENCE OF URINE PIGMENT   PROTEINUR (A) 01/31/2017 1205    TEST NOT REPORTED DUE TO COLOR INTERFERENCE OF URINE PIGMENT   NITRITE (A) 01/31/2017 1205    TEST NOT REPORTED DUE TO COLOR INTERFERENCE OF URINE PIGMENT   LEUKOCYTESUR (A) 01/31/2017 1205    TEST NOT REPORTED DUE TO COLOR INTERFERENCE OF URINE PIGMENT    Discussed in detail  with patient's former daughter-in-law. I have been unable to reach patient's son despite several attempts to call him on the phone number listed. According to her, patient lives  with his son but when his son is out of town, she visits him multiple times in the day to supervise and assist. His mental status and overall health has been progressively declining over several months. He had seen a neurologist in the past. His mental status seemed to do better after chronic Zoloft was eventually stopped. She gives history of patient urinating in the kitchen trash can, wandering off from the house etc. I updated her on his hospital course, care and answered all questions.   Time coordinating discharge: Over 30 minutes  SIGNED:  Marcellus Scott, MD, FACP, FHM. [facility] Pager [phone]  If 7PM-7AM, please contact night-coverage [email or web address] Password [email or web address] 02/02/2017, 1:19 PM

## 2017-02-02 NOTE — Progress Notes (Signed)
Pt is medically stable for D/C to [facility].  RN will call and arrange EMS for transport.  [facility] accepted and they are ready for pt.  Number for report is [phone].  CSW sent D/C orders to Tammy (admissions person) via the hub.  CSW contacted/spoke to the pt's ex-DIL Bonita QuinLinda at ph: [phone] and they are aware of the D/C plan.  Bonita QuinLinda is in agreement with the plan.  Please reconsult with CSW in future if needed.   Dorothe PeaJonathan F. Lizet Kelso, LCSWA, LCAS

## 2017-02-02 NOTE — Progress Notes (Signed)
CSW delivered Medical Necessity Form and Facesheet to RN who added hard copy of pt's prescriptions.  CSW called [facility] for transport and notified pt's family and notified Tammy of [facility].  Please reconsult if future social work needs arise.  CSW signing off.  Dorothe PeaJonathan F. Katina Remick, Theresia MajorsLCSWA, LCAS Clinical Social Worker Ph: [phone]

## 2017-02-02 NOTE — Progress Notes (Signed)
Nsg Discharge Note  Admit Date:  01/28/2017 Discharge date: 02/02/2017   [place] to be D/C'd Skilled nursing facility per MD order.  AVS completed.  Copy for chart, and copy for patient signed, and dated. Patient/caregiver able to verbalize understanding.  Discharge Medication: Allergies as of 02/02/2017      Reactions   Codeine Anxiety   Hydroxyzine Other (See Comments)   Makes things worse   Seroquel [quetiapine] Other (See Comments)   Makes things worse      Medication List    STOP taking these medications   cephALEXin 500 MG capsule Commonly known as:  KEFLEX     TAKE these medications   amLODipine 5 MG tablet Commonly known as:  NORVASC Take 1 tablet (5 mg total) by mouth daily. Start taking on:  02/03/2017   aspirin EC 81 MG tablet Take 81 mg by mouth daily.   atenolol 25 MG tablet Commonly known as:  TENORMIN Take 1 tablet (25 mg total) by mouth daily.   bisacodyl 5 MG EC tablet Commonly known as:  DULCOLAX Take 5 mg by mouth daily as needed for moderate constipation.   doxycycline 100 MG tablet Commonly known as:  VIBRA-TABS Take 1 tablet (100 mg total) by mouth 2 (two) times daily. Discontinue after 02/03/17 doses.   feeding supplement (ENSURE ENLIVE) Liqd Take 237 mLs by mouth daily.   feeding supplement (PRO-STAT SUGAR FREE 64) Liqd Take 30 mLs by mouth 2 (two) times daily.   multivitamin with minerals Tabs tablet Take 1 tablet by mouth daily. Start taking on:  02/03/2017   mupirocin cream 2 % Commonly known as:  BACTROBAN Apply topically daily. Apply Bactroban to left foot wound Q day, then cover with foam dressing. (Change foam dressing Q 3 days or PRN soiling.)  Apply Bactroban to right leg scabbed areas Q day, leave open to air Start taking on:  02/03/2017   risperiDONE 0.5 MG tablet Commonly known as:  RISPERDAL Take 1 tablet (0.5 mg total) by mouth daily at 6 PM.   tamsulosin 0.4 MG Caps capsule Commonly known as:  FLOMAX Take 1  capsule (0.4 mg total) by mouth daily. Start taking on:  02/03/2017       Discharge Assessment: Vitals:   02/01/17 2139 02/02/17 0544  BP: (!) 154/64 (!) 173/69  Pulse: 88 100  Resp: 18 18  Temp: 98.3 F (36.8 C) 97.4 F (36.3 C)   Skin clean, dry and intact without evidence of skin break down, no evidence of skin tears noted. IV catheter discontinued intact. Site without signs and symptoms of complications - no redness or edema noted at insertion site, patient denies c/o pain - only slight tenderness at site.  Dressing with slight pressure applied.  D/c Instructions-Education: Discharge instructions given to patient/family with verbalized understanding. D/c education completed with patient/family including follow up instructions, medication list, d/c activities limitations if indicated, with other d/c instructions as indicated by MD - patient able to verbalize understanding, all questions fully answered. Patient instructed to return to ED, call 911, or call MD for any changes in condition.  [facility]. Pt will transport via [facility]  Camillo FlamingVicki L Usbaldo Pannone, RN 02/02/2017 2:09 PM

## 2017-02-03 ENCOUNTER — Encounter: Payer: Self-pay | Admitting: Internal Medicine

## 2017-02-03 ENCOUNTER — Non-Acute Institutional Stay (SKILLED_NURSING_FACILITY): Payer: Medicare Other | Admitting: Internal Medicine

## 2017-02-03 DIAGNOSIS — N320 Bladder-neck obstruction: Secondary | ICD-10-CM

## 2017-02-03 DIAGNOSIS — E441 Mild protein-calorie malnutrition: Secondary | ICD-10-CM | POA: Diagnosis not present

## 2017-02-03 DIAGNOSIS — D649 Anemia, unspecified: Secondary | ICD-10-CM | POA: Diagnosis not present

## 2017-02-03 DIAGNOSIS — F0391 Unspecified dementia with behavioral disturbance: Secondary | ICD-10-CM | POA: Diagnosis not present

## 2017-02-03 DIAGNOSIS — N1330 Unspecified hydronephrosis: Secondary | ICD-10-CM

## 2017-02-03 DIAGNOSIS — S91302D Unspecified open wound, left foot, subsequent encounter: Secondary | ICD-10-CM | POA: Diagnosis not present

## 2017-02-03 DIAGNOSIS — I10 Essential (primary) hypertension: Secondary | ICD-10-CM | POA: Diagnosis not present

## 2017-02-03 LAB — CULTURE, BLOOD (ROUTINE X 2)
CULTURE: NO GROWTH
CULTURE: NO GROWTH

## 2017-02-03 NOTE — Progress Notes (Signed)
Patient ID: [place], male   DOB: 15-Apr-1933, 83 y.o.   MRN: [id number]    HISTORY AND PHYSICAL   DATE: 02/03/2017  Location:    [facility] Room Number: 465 A Place of Service: SNF (31)   Extended Emergency Contact Information Primary Emergency Contact: Cross,Jonathan Address: [address]          [place],  [address] [name] of [place] Phone: [phone] Mobile Phone: [phone] Relation: Son Secondary Emergency Contact: Orland Mustard [place] of [place] Phone: [phone] Mobile Phone: [phone] Relation: Friend  Advanced Directive information Does Patient Have a Medical Advance Directive?: Yes, Type of Advance Directive: Healthcare Power of Attorney  Chief Complaint  Patient presents with  . New Admit To SNF    HPI:  83 yo male seen today as a new admission into SNF following hospital stay for right foot cellulitis, AKI, urinary retention with bladder outlet obstruction/ bilateral hydronephrosis per renal US, dementia, left heel open wound, HTN, anemia, hx RLE fx 17 yrs ago s/p bypass sx and partial toe amputation. He was tx with empiric IV vanco/zosyn. Blood cx neg. ABIs technically limited 2/2 anatomy. ABI b/l 1.44. CRP 6.3. IV abx changed to po Doxycycline to complete 02/03/17. BP meds adjusted. K 3.2; Hgb 8.8-->7.7-->8.9; WBC 9.4K-->12.3K; albumin 3; Cr 1.65-->1.2. He presents to SNF for short term rehab.  Today he reports felling well. No concerns. He is a poor historian due to dementia. Hx obtained from chart. No nursing concerns. No falls. Appetite ok and sleeps well. He is a [place] vet. He gets nutritional supplements due to protein calorie malnutrition.   Dementia - he takes no meds for cognition but he does take risperdal qPM for behavioral disturbance  HTN - BP stable on norvasc, atenolol. He takes ASA daily  PAD - s/p RLE bypass sx several yrs ago. Takes ASA daily  Hx anemia - Hgb 8.9. He takes MVI  daily  Past Medical History:  Diagnosis Date  . Hypertension   . SunDown syndrome     Past Surgical History:  Procedure Laterality Date  . BYPASS GRAFT Right    artery bypass  . TIBIA FRACTURE SURGERY     R shin    Patient Care Team: Maren Reamer, MD as PCP - General (Internal Medicine)  Social History   Social History  . Marital status: Widowed    Spouse name: N/A  . Number of children: 2  . Years of education: 12   Occupational History  .      career Social research officer, government   Social History Main Topics  . Smoking status: Never Smoker  . Smokeless tobacco: Never Used  . 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 Drug use: No  . Sexual activity: Not on file   Other Topics Concern  . Not on file   Social History Narrative   Son lives with patient sometimes   Caffeine- none     reports that he has never smoked. He has never used smokeless tobacco. He reports that he does not drink alcohol or use drugs.  Family History  Problem Relation Age of Onset  . Cancer Mother   . Cancer Father     prostate   Family Status  Relation Status  . Mother Deceased  . Father Deceased    Immunization History  Administered Date(s) Administered  . Influenza,inj,Quad PF,36+ Mos 11/25/2016    Allergies  Allergen Reactions  .  Codeine Anxiety  . Hydroxyzine Other (See Comments)    Makes things worse  . Seroquel [Quetiapine] Other (See Comments)    Makes things worse    Medications: Patient's Medications  New Prescriptions   No medications on file  Previous Medications   AMINO ACIDS-PROTEIN HYDROLYS (FEEDING SUPPLEMENT, PRO-STAT SUGAR FREE 64,) LIQD    Take 30 mLs by mouth 2 (two) times daily.   AMLODIPINE (NORVASC) 5 MG TABLET    Take 1 tablet (5 mg total) by mouth daily.   ASPIRIN EC 81 MG TABLET    Take 81 mg by mouth daily.   ATENOLOL (TENORMIN) 25 MG TABLET    Take 1 tablet (25 mg total) by mouth daily.   BISACODYL (DULCOLAX) 5 MG EC TABLET    Take 5 mg by  mouth daily as needed for moderate constipation.   DOXYCYCLINE (VIBRA-TABS) 100 MG TABLET    Take 1 tablet (100 mg total) by mouth 2 (two) times daily. Discontinue after 02/03/17 doses.   FEEDING SUPPLEMENT, ENSURE ENLIVE, (ENSURE ENLIVE) LIQD    Take 237 mLs by mouth daily.   MULTIPLE VITAMIN (MULTIVITAMIN WITH MINERALS) TABS TABLET    Take 1 tablet by mouth daily.   MUPIROCIN CREAM (BACTROBAN) 2 %    Apply topically daily. Apply Bactroban to left foot wound Q day, then cover with foam dressing. (Change foam dressing Q 3 days or PRN soiling.)  Apply Bactroban to right leg scabbed areas Q day, leave open to air   RISPERIDONE (RISPERDAL) 0.5 MG TABLET    Take 1 tablet (0.5 mg total) by mouth daily at 6 PM.   TAMSULOSIN (FLOMAX) 0.4 MG CAPS CAPSULE    Take 1 capsule (0.4 mg total) by mouth daily.  Modified Medications   No medications on file  Discontinued Medications   No medications on file    Review of Systems  Unable to perform ROS: Dementia    Vitals:   02/03/17 1018  BP: 140/60  Pulse: 90  Resp: 16  Temp: 98.2 F (36.8 C)  TempSrc: Oral  SpO2: 97%   There is no height or weight on file to calculate BMI.  Physical Exam  Constitutional: He appears well-developed.  Frail appearing in NAD, sitting in w/c  HENT:  Mouth/Throat: Oropharynx is clear and moist.  MMM; no oral thrush  Eyes: Pupils are equal, round, and reactive to light. No scleral icterus.  Neck: Neck supple. No JVD present. Carotid bruit is not present. No tracheal deviation present. No thyromegaly present.  Cardiovascular: Normal rate, regular rhythm and intact distal pulses.  Exam reveals no gallop and no friction rub.   Murmur heard.  Systolic murmur is present with a grade of 1/6  RLE deformity distally with swelling noted. No calf TTP. Trace LLE edema. No calf TTP. PT/DP pulse intact on left. Unable to appreciate pukse on right foot due to deformity. Distal LLE in soft boot; Right foot in soft shoe brace    Pulmonary/Chest: Effort normal and breath sounds normal. He has no wheezes. He has no rales. He exhibits no tenderness.  Abdominal: Soft. Bowel sounds are normal. He exhibits no distension, no abdominal bruit, no pulsatile midline mass and no mass. There is no hepatomegaly. There is no tenderness. There is no rebound and no guarding.  Genitourinary:  Genitourinary Comments: Foley DTG clear yellow urine  Musculoskeletal: He exhibits edema and deformity.  Lymphadenopathy:    He has no cervical adenopathy.  Neurological: He is alert.  Skin:  Skin is warm and dry. Rash noted.  Psychiatric: He has a normal mood and affect. His behavior is normal.     Labs reviewed: Admission on 01/28/2017, Discharged on 02/02/2017  Component Date Value Ref Range Status  . Lactic Acid, Venous 01/28/2017 0.82  0.5 - 1.9 mmol/L Final  . Sodium 01/28/2017 145  135 - 145 mmol/L Final  . Potassium 01/28/2017 3.5  3.5 - 5.1 mmol/L Final  . Chloride 01/28/2017 109  101 - 111 mmol/L Final  . CO2 01/28/2017 29  22 - 32 mmol/L Final  . Glucose, Bld 01/28/2017 95  65 - 99 mg/dL Final  . BUN 01/28/2017 16  6 - 20 mg/dL Final  . Creatinine, Ser 01/28/2017 1.65* 0.61 - 1.24 mg/dL Final  . Calcium 01/28/2017 8.7* 8.9 - 10.3 mg/dL Final  . Total Protein 01/28/2017 6.2* 6.5 - 8.1 g/dL Final  . Albumin 01/28/2017 3.0* 3.5 - 5.0 g/dL Final  . AST 01/28/2017 21  15 - 41 U/L Final  . ALT 01/28/2017 11* 17 - 63 U/L Final  . Alkaline Phosphatase 01/28/2017 45  38 - 126 U/L Final  . Total Bilirubin 01/28/2017 0.5  0.3 - 1.2 mg/dL Final  . GFR calc non Af Amer 01/28/2017 37* >60 mL/min Final  . GFR calc Af Amer 01/28/2017 43* >60 mL/min Final   Comment: (NOTE) The eGFR has been calculated using the CKD EPI equation. This calculation has not been validated in all clinical situations. eGFR's persistently <60 mL/min signify possible Chronic Kidney Disease.   . Anion gap 01/28/2017 7  5 - 15 Final  . WBC 01/28/2017 9.4  4.0 -  10.5 K/uL Final  . RBC 01/28/2017 3.70* 4.22 - 5.81 MIL/uL Final  . Hemoglobin 01/28/2017 8.8* 13.0 - 17.0 g/dL Final  . HCT 01/28/2017 29.5* 39.0 - 52.0 % Final  . MCV 01/28/2017 79.7  78.0 - 100.0 fL Final  . MCH 01/28/2017 23.8* 26.0 - 34.0 pg Final  . MCHC 01/28/2017 29.8* 30.0 - 36.0 g/dL Final  . RDW 01/28/2017 14.8  11.5 - 15.5 % Final  . Platelets 01/28/2017 378  150 - 400 K/uL Final  . Neutrophils Relative % 01/28/2017 73  % Final  . Neutro Abs 01/28/2017 6.9  1.7 - 7.7 K/uL Final  . Lymphocytes Relative 01/28/2017 17  % Final  . Lymphs Abs 01/28/2017 1.6  0.7 - 4.0 K/uL Final  . Monocytes Relative 01/28/2017 8  % Final  . Monocytes Absolute 01/28/2017 0.7  0.1 - 1.0 K/uL Final  . Eosinophils Relative 01/28/2017 2  % Final  . Eosinophils Absolute 01/28/2017 0.2  0.0 - 0.7 K/uL Final  . Basophils Relative 01/28/2017 0  % Final  . Basophils Absolute 01/28/2017 0.0  0.0 - 0.1 K/uL Final  . Color, Urine 01/29/2017 YELLOW  YELLOW Final  . APPearance 01/29/2017 CLEAR  CLEAR Final  . Specific Gravity, Urine 01/29/2017 1.008  1.005 - 1.030 Final  . pH 01/29/2017 6.0  5.0 - 8.0 Final  . Glucose, UA 01/29/2017 NEGATIVE  NEGATIVE mg/dL Final  . Hgb urine dipstick 01/29/2017 SMALL* NEGATIVE Final  . Bilirubin Urine 01/29/2017 NEGATIVE  NEGATIVE Final  . Ketones, ur 01/29/2017 NEGATIVE  NEGATIVE mg/dL Final  . Protein, ur 01/29/2017 NEGATIVE  NEGATIVE mg/dL Final  . Nitrite 01/29/2017 NEGATIVE  NEGATIVE Final  . Leukocytes, UA 01/29/2017 NEGATIVE  NEGATIVE Final  . RBC / HPF 01/29/2017 0-5  0 - 5 RBC/hpf Final  . WBC, UA 01/29/2017 0-5  0 -  5 WBC/hpf Final  . Bacteria, UA 01/29/2017 NONE SEEN  NONE SEEN Final  . Squamous Epithelial / LPF 01/29/2017 NONE SEEN  NONE SEEN Final  . Lactic Acid, Venous 01/28/2017 1.15  0.5 - 1.9 mmol/L Final  . Specimen Description 01/29/2017 BLOOD LEFT FOREARM   Final  . Special Requests 01/29/2017 AEROBIC BOTTLE ONLY 5ML   Final  . Culture 01/29/2017 NO  GROWTH 4 DAYS   Final  . Report Status 01/29/2017 PENDING   Incomplete  . Specimen Description 01/29/2017 BLOOD RIGHT WRIST   Final  . Special Requests 01/29/2017 IN PEDIATRIC BOTTLE 3ML   Final  . Culture 01/29/2017 NO GROWTH 4 DAYS   Final  . Report Status 01/29/2017 PENDING   Incomplete  . Fecal Occult Bld 01/29/2017 NEGATIVE  NEGATIVE Final  . Creatinine, Urine 01/29/2017 64.19  mg/dL Final  . Sodium, Ur 01/29/2017 27  mmol/L Final  . Sodium 01/29/2017 145  135 - 145 mmol/L Final  . Potassium 01/29/2017 3.8  3.5 - 5.1 mmol/L Final  . Chloride 01/29/2017 106  101 - 111 mmol/L Final  . CO2 01/29/2017 26  22 - 32 mmol/L Final  . Glucose, Bld 01/29/2017 92  65 - 99 mg/dL Final  . BUN 01/29/2017 17  6 - 20 mg/dL Final  . Creatinine, Ser 01/29/2017 1.71* 0.61 - 1.24 mg/dL Final  . Calcium 01/29/2017 8.2* 8.9 - 10.3 mg/dL Final  . GFR calc non Af Amer 01/29/2017 36* >60 mL/min Final  . GFR calc Af Amer 01/29/2017 41* >60 mL/min Final   Comment: (NOTE) The eGFR has been calculated using the CKD EPI equation. This calculation has not been validated in all clinical situations. eGFR's persistently <60 mL/min signify possible Chronic Kidney Disease.   . Anion gap 01/29/2017 13  5 - 15 Final  . WBC 01/29/2017 10.1  4.0 - 10.5 K/uL Final  . RBC 01/29/2017 3.25* 4.22 - 5.81 MIL/uL Final  . Hemoglobin 01/29/2017 7.7* 13.0 - 17.0 g/dL Final  . HCT 01/29/2017 25.9* 39.0 - 52.0 % Final  . MCV 01/29/2017 79.7  78.0 - 100.0 fL Final  . MCH 01/29/2017 23.7* 26.0 - 34.0 pg Final  . MCHC 01/29/2017 29.7* 30.0 - 36.0 g/dL Final  . RDW 01/29/2017 14.8  11.5 - 15.5 % Final  . Platelets 01/29/2017 318  150 - 400 K/uL Final  . Sed Rate 01/29/2017 55* 0 - 16 mm/hr Final  . CRP 01/29/2017 6.3* <1.0 mg/dL Final  . WBC 01/30/2017 12.6* 4.0 - 10.5 K/uL Final  . RBC 01/30/2017 3.72* 4.22 - 5.81 MIL/uL Final  . Hemoglobin 01/30/2017 8.8* 13.0 - 17.0 g/dL Final  . HCT 01/30/2017 29.6* 39.0 - 52.0 % Final  .  MCV 01/30/2017 79.6  78.0 - 100.0 fL Final  . MCH 01/30/2017 23.7* 26.0 - 34.0 pg Final  . MCHC 01/30/2017 29.7* 30.0 - 36.0 g/dL Final  . RDW 01/30/2017 15.2  11.5 - 15.5 % Final  . Platelets 01/30/2017 404* 150 - 400 K/uL Final  . Sodium 01/30/2017 146* 135 - 145 mmol/L Final  . Potassium 01/30/2017 3.3* 3.5 - 5.1 mmol/L Final  . Chloride 01/30/2017 107  101 - 111 mmol/L Final  . CO2 01/30/2017 23  22 - 32 mmol/L Final  . Glucose, Bld 01/30/2017 112* 65 - 99 mg/dL Final  . BUN 01/30/2017 20  6 - 20 mg/dL Final  . Creatinine, Ser 01/30/2017 2.28* 0.61 - 1.24 mg/dL Final  . Calcium 01/30/2017 8.7* 8.9 - 10.3  mg/dL Final  . GFR calc non Af Amer 01/30/2017 25* >60 mL/min Final  . GFR calc Af Amer 01/30/2017 29* >60 mL/min Final   Comment: (NOTE) The eGFR has been calculated using the CKD EPI equation. This calculation has not been validated in all clinical situations. eGFR's persistently <60 mL/min signify possible Chronic Kidney Disease.   . Anion gap 01/30/2017 16* 5 - 15 Final  . Sodium, Ur 01/30/2017 88  mmol/L Final  . Creatinine, Urine 01/30/2017 26.09  mg/dL Final  . Sodium 01/31/2017 144  135 - 145 mmol/L Final  . Potassium 01/31/2017 3.5  3.5 - 5.1 mmol/L Final  . Chloride 01/31/2017 106  101 - 111 mmol/L Final  . CO2 01/31/2017 24  22 - 32 mmol/L Final  . Glucose, Bld 01/31/2017 125* 65 - 99 mg/dL Final  . BUN 01/31/2017 20  6 - 20 mg/dL Final  . Creatinine, Ser 01/31/2017 1.94* 0.61 - 1.24 mg/dL Final  . Calcium 01/31/2017 8.8* 8.9 - 10.3 mg/dL Final  . GFR calc non Af Amer 01/31/2017 31* >60 mL/min Final  . GFR calc Af Amer 01/31/2017 36* >60 mL/min Final   Comment: (NOTE) The eGFR has been calculated using the CKD EPI equation. This calculation has not been validated in all clinical situations. eGFR's persistently <60 mL/min signify possible Chronic Kidney Disease.   . Anion gap 01/31/2017 14  5 - 15 Final  . WBC 01/31/2017 12.3* 4.0 - 10.5 K/uL Final  . RBC  01/31/2017 3.72* 4.22 - 5.81 MIL/uL Final  . Hemoglobin 01/31/2017 8.9* 13.0 - 17.0 g/dL Final  . HCT 01/31/2017 29.4* 39.0 - 52.0 % Final  . MCV 01/31/2017 79.0  78.0 - 100.0 fL Final  . MCH 01/31/2017 23.9* 26.0 - 34.0 pg Final  . MCHC 01/31/2017 30.3  30.0 - 36.0 g/dL Final  . RDW 01/31/2017 15.3  11.5 - 15.5 % Final  . Platelets 01/31/2017 366  150 - 400 K/uL Final  . Color, Urine 01/30/2017 RED* YELLOW Final  . APPearance 01/30/2017 TURBID* CLEAR Final  . Specific Gravity, Urine 01/30/2017 TEST NOT REPORTED DUE TO COLOR INTERFERENCE OF URINE PIGMENT  1.005 - 1.030 Final  . pH 01/30/2017 TEST NOT REPORTED DUE TO COLOR INTERFERENCE OF URINE PIGMENT  5.0 - 8.0 Final  . Glucose, UA 01/30/2017 TEST NOT REPORTED DUE TO COLOR INTERFERENCE OF URINE PIGMENT* NEGATIVE mg/dL Final  . Hgb urine dipstick 01/30/2017 TEST NOT REPORTED DUE TO COLOR INTERFERENCE OF URINE PIGMENT* NEGATIVE Final  . Bilirubin Urine 01/30/2017 TEST NOT REPORTED DUE TO COLOR INTERFERENCE OF URINE PIGMENT* NEGATIVE Final  . Ketones, ur 01/30/2017 TEST NOT REPORTED DUE TO COLOR INTERFERENCE OF URINE PIGMENT* NEGATIVE mg/dL Final  . Protein, ur 01/30/2017 TEST NOT REPORTED DUE TO COLOR INTERFERENCE OF URINE PIGMENT* NEGATIVE mg/dL Final  . Nitrite 01/30/2017 TEST NOT REPORTED DUE TO COLOR INTERFERENCE OF URINE PIGMENT* NEGATIVE Final  . Leukocytes, UA 01/30/2017 TEST NOT REPORTED DUE TO COLOR INTERFERENCE OF URINE PIGMENT* NEGATIVE Final  . RBC / HPF 01/30/2017 TOO NUMEROUS TO COUNT  0 - 5 RBC/hpf Final  . WBC, UA 01/30/2017 TOO NUMEROUS TO COUNT  0 - 5 WBC/hpf Final  . Bacteria, UA 01/30/2017 MANY* NONE SEEN Final  . Squamous Epithelial / LPF 01/30/2017 NONE SEEN  NONE SEEN Final  . Color, Urine 01/31/2017 RED* YELLOW Final  . APPearance 01/31/2017 TURBID* CLEAR Final  . Specific Gravity, Urine 01/31/2017 TEST NOT REPORTED DUE TO COLOR INTERFERENCE OF URINE PIGMENT  1.005 -  1.030 Final  . pH 01/31/2017 TEST NOT REPORTED DUE  TO COLOR INTERFERENCE OF URINE PIGMENT  5.0 - 8.0 Final  . Glucose, UA 01/31/2017 TEST NOT REPORTED DUE TO COLOR INTERFERENCE OF URINE PIGMENT* NEGATIVE mg/dL Final  . Hgb urine dipstick 01/31/2017 TEST NOT REPORTED DUE TO COLOR INTERFERENCE OF URINE PIGMENT* NEGATIVE Final  . Bilirubin Urine 01/31/2017 TEST NOT REPORTED DUE TO COLOR INTERFERENCE OF URINE PIGMENT* NEGATIVE Final  . Ketones, ur 01/31/2017 TEST NOT REPORTED DUE TO COLOR INTERFERENCE OF URINE PIGMENT* NEGATIVE mg/dL Final  . Protein, ur 01/31/2017 TEST NOT REPORTED DUE TO COLOR INTERFERENCE OF URINE PIGMENT* NEGATIVE mg/dL Final  . Nitrite 01/31/2017 TEST NOT REPORTED DUE TO COLOR INTERFERENCE OF URINE PIGMENT* NEGATIVE Final  . Leukocytes, UA 01/31/2017 TEST NOT REPORTED DUE TO COLOR INTERFERENCE OF URINE PIGMENT* NEGATIVE Final  . RBC / HPF 01/31/2017 TOO NUMEROUS TO COUNT  0 - 5 RBC/hpf Final  . WBC, UA 01/31/2017 TOO NUMEROUS TO COUNT  0 - 5 WBC/hpf Final  . Bacteria, UA 01/31/2017 FEW* NONE SEEN Final  . Squamous Epithelial / LPF 01/31/2017 NONE SEEN  NONE SEEN Final  . Mucous 01/31/2017 PRESENT   Final  . Specimen Description 01/31/2017 URINE, RANDOM   Final  . Special Requests 01/31/2017 NONE   Final  . Culture 01/31/2017 NO GROWTH   Final  . Report Status 01/31/2017 02/01/2017 FINAL   Final  . Sodium 02/01/2017 143  135 - 145 mmol/L Final  . Potassium 02/01/2017 2.9* 3.5 - 5.1 mmol/L Final  . Chloride 02/01/2017 106  101 - 111 mmol/L Final  . CO2 02/01/2017 28  22 - 32 mmol/L Final  . Glucose, Bld 02/01/2017 108* 65 - 99 mg/dL Final  . BUN 02/01/2017 12  6 - 20 mg/dL Final  . Creatinine, Ser 02/01/2017 1.34* 0.61 - 1.24 mg/dL Final  . Calcium 02/01/2017 8.5* 8.9 - 10.3 mg/dL Final  . GFR calc non Af Amer 02/01/2017 48* >60 mL/min Final  . GFR calc Af Amer 02/01/2017 56* >60 mL/min Final   Comment: (NOTE) The eGFR has been calculated using the CKD EPI equation. This calculation has not been validated in all clinical  situations. eGFR's persistently <60 mL/min signify possible Chronic Kidney Disease.   . Anion gap 02/01/2017 9  5 - 15 Final  . Magnesium 02/01/2017 1.7  1.7 - 2.4 mg/dL Final  . Sodium 02/02/2017 142  135 - 145 mmol/L Final  . Potassium 02/02/2017 3.2* 3.5 - 5.1 mmol/L Final  . Chloride 02/02/2017 103  101 - 111 mmol/L Final  . CO2 02/02/2017 29  22 - 32 mmol/L Final  . Glucose, Bld 02/02/2017 99  65 - 99 mg/dL Final  . BUN 02/02/2017 16  6 - 20 mg/dL Final  . Creatinine, Ser 02/02/2017 1.20  0.61 - 1.24 mg/dL Final  . Calcium 02/02/2017 8.6* 8.9 - 10.3 mg/dL Final  . GFR calc non Af Amer 02/02/2017 55* >60 mL/min Final  . GFR calc Af Amer 02/02/2017 >60  >60 mL/min Final   Comment: (NOTE) The eGFR has been calculated using the CKD EPI equation. This calculation has not been validated in all clinical situations. eGFR's persistently <60 mL/min signify possible Chronic Kidney Disease.   . Anion gap 02/02/2017 10  5 - 15 Final  Office Visit on 11/25/2016  Component Date Value Ref Range Status  . Ferritin 11/25/2016 99  20 - 380 ng/mL Final  . Iron 11/25/2016 32* 50 - 180 ug/dL Final  .  TIBC 11/25/2016 331  250 - 425 ug/dL Final  . %SAT 11/25/2016 10* 15 - 60 % Final  . WBC 11/25/2016 9.6  3.8 - 10.8 K/uL Final  . RBC 11/25/2016 4.62  4.20 - 5.80 MIL/uL Final  . Hemoglobin 11/25/2016 11.7* 13.2 - 17.1 g/dL Final  . HCT 11/25/2016 38.6  38.5 - 50.0 % Final  . MCV 11/25/2016 83.5  80.0 - 100.0 fL Final  . MCH 11/25/2016 25.3* 27.0 - 33.0 pg Final  . MCHC 11/25/2016 30.3* 32.0 - 36.0 g/dL Final  . RDW 11/25/2016 14.8  11.0 - 15.0 % Final  . Platelets 11/25/2016 287  140 - 400 K/uL Final  . MPV 11/25/2016 9.7  7.5 - 12.5 fL Final  . Neutro Abs 11/25/2016 7776  1,500 - 7,800 cells/uL Final  . Lymphs Abs 11/25/2016 1056  850 - 3,900 cells/uL Final  . Monocytes Absolute 11/25/2016 672  200 - 950 cells/uL Final  . Eosinophils Absolute 11/25/2016 96  15 - 500 cells/uL Final  .  Basophils Absolute 11/25/2016 0  0 - 200 cells/uL Final  . Neutrophils Relative % 11/25/2016 81  % Final  . Lymphocytes Relative 11/25/2016 11  % Final  . Monocytes Relative 11/25/2016 7  % Final  . Eosinophils Relative 11/25/2016 1  % Final  . Basophils Relative 11/25/2016 0  % Final  . Smear Review 11/25/2016 Criteria for review not met   Final  . Sodium 11/25/2016 142  135 - 146 mmol/L Final  . Potassium 11/25/2016 4.2  3.5 - 5.3 mmol/L Final  . Chloride 11/25/2016 102  98 - 110 mmol/L Final  . CO2 11/25/2016 32* 20 - 31 mmol/L Final  . Glucose, Bld 11/25/2016 102* 65 - 99 mg/dL Final  . BUN 11/25/2016 18  7 - 25 mg/dL Final  . Creat 11/25/2016 0.82  0.70 - 1.11 mg/dL Final   Comment:   For patients > or = 81 years of age: The upper reference limit for Creatinine is approximately 13% higher for people identified as African-American.     . Calcium 11/25/2016 9.4  8.6 - 10.3 mg/dL Final  . GFR, Est African American 11/25/2016 >89  >=60 mL/min Final  . GFR, Est Non African American 11/25/2016 83  >=60 mL/min Final  . Vit D, 25-Hydroxy 11/25/2016 25* 30 - 100 ng/mL Final   Comment: Vitamin D Status           25-OH Vitamin D        Deficiency                <20 ng/mL        Insufficiency         20 - 29 ng/mL        Optimal             > or = 30 ng/mL   For 25-OH Vitamin D testing on patients on D2-supplementation and patients for whom quantitation of D2 and D3 fractions is required, the QuestAssureD 25-OH VIT D, (D2,D3), LC/MS/MS is recommended: order code [phone] (patients > 2 yrs).   . Brain Natriuretic Peptide 11/25/2016 149.5* <100 pg/mL Final   Comment:   BNP levels increase with age in the general population with the highest values seen in individuals greater than 30 years of age. Reference: Joellyn Rued Cardiol 2002; 37:902-40.     . Vitamin B-12 11/25/2016 335  200 - 1,100 pg/mL Final  . Folate 11/25/2016 14.2  >5.4 ng/mL Final  Comment: Reference Range >17 years:    Low: <3.4 ng/mL              Borderline: 3.4-5.4 ng/mL              Normal: >5.4 ng/mL     . RPR Ser Ql 11/25/2016 NON REAC  NON REAC Final    US Renal  Result Date: 01/30/2017 CLINICAL DATA:  Acute kidney injury. EXAM: RENAL / URINARY TRACT ULTRASOUND COMPLETE COMPARISON:  None. FINDINGS: Right Kidney: Length: 11.2 cm. Echogenicity is increased. Moderate hydronephrosis visualized. Left Kidney: Length: 10.8. Echogenicity is increased. Moderate hydronephrosis visualized. Bladder: Irregular, trabeculated appearance of the bladder wall is seen. IMPRESSION: Moderate bilateral hydronephrosis. Increased cortical echogenicity bilaterally consistent with medical renal disease. Trabeculated appearance of the urinary bladder wall suggestive of bladder outlet obstruction. Electronically Signed   By: Inge Rise M.D.   On: 01/30/2017 09:26     Assessment/Plan   ICD-9-CM ICD-10-CM   1. Dementia with behavioral disturbance, unspecified dementia type 294.21 F03.91   2. Bladder outlet obstruction - has urinary retention and now has foley cath 596.0 N32.0   3. Open wound of left heel, subsequent encounter V58.89 S91.302D    892.0    4. HTN (hypertension), benign 401.1 I10   5. Normocytic anemia 285.9 D64.9   6. Bilateral hydronephrosis 591 N13.30   7.       Mild protein calorie malnutrition              263.1        E44.1  Check CBC and BMP  F/u with urology Dr Karsten Ro for bladder outlet obstruction and b/l hydronephrosis. Continue flomax as ordered  Cont other meds as ordered  PT/OT/ST as ordered  Nutritional supplements as ordered  Wound care as ordered  Foley cath care as indicated  GOAL: short term rehab and d/c home when medically appropriate. Communicated with pt and nursing.  Will follow  Tylee Yum S. Perlie Gold  [facility] [address] [place], [place] [address] [phone] Cell (Monday-Friday 8 AM - 5 PM) [phone]  After 5 PM and follow prompts

## 2017-02-05 ENCOUNTER — Non-Acute Institutional Stay (SKILLED_NURSING_FACILITY): Payer: Medicare Other | Admitting: Adult Health

## 2017-02-05 DIAGNOSIS — R451 Restlessness and agitation: Secondary | ICD-10-CM

## 2017-02-05 DIAGNOSIS — F28 Other psychotic disorder not due to a substance or known physiological condition: Secondary | ICD-10-CM | POA: Diagnosis not present

## 2017-02-05 DIAGNOSIS — F0391 Unspecified dementia with behavioral disturbance: Secondary | ICD-10-CM

## 2017-02-05 DIAGNOSIS — F03918 Unspecified dementia, unspecified severity, with other behavioral disturbance: Secondary | ICD-10-CM

## 2017-02-08 ENCOUNTER — Emergency Department (HOSPITAL_COMMUNITY)
Admission: EM | Admit: 2017-02-08 | Discharge: 2017-02-08 | Disposition: A | Discharge status: Home / Self Care | Payer: Medicare Other | Attending: Emergency Medicine | Admitting: Emergency Medicine

## 2017-02-08 ENCOUNTER — Encounter (HOSPITAL_COMMUNITY): Payer: Self-pay | Admitting: Emergency Medicine

## 2017-02-08 DIAGNOSIS — Z7982 Long term (current) use of aspirin: Secondary | ICD-10-CM | POA: Insufficient documentation

## 2017-02-08 DIAGNOSIS — Z79899 Other long term (current) drug therapy: Secondary | ICD-10-CM | POA: Diagnosis not present

## 2017-02-08 DIAGNOSIS — I10 Essential (primary) hypertension: Secondary | ICD-10-CM | POA: Insufficient documentation

## 2017-02-08 DIAGNOSIS — F0390 Unspecified dementia without behavioral disturbance: Secondary | ICD-10-CM | POA: Diagnosis not present

## 2017-02-08 DIAGNOSIS — T83098A Other mechanical complication of other indwelling urethral catheter, initial encounter: Secondary | ICD-10-CM | POA: Diagnosis not present

## 2017-02-08 DIAGNOSIS — T83021A Displacement of indwelling urethral catheter, initial encounter: Secondary | ICD-10-CM

## 2017-02-08 DIAGNOSIS — Y733 Surgical instruments, materials and gastroenterology and urology devices (including sutures) associated with adverse incidents: Secondary | ICD-10-CM | POA: Insufficient documentation

## 2017-02-08 DIAGNOSIS — F0391 Unspecified dementia with behavioral disturbance: Secondary | ICD-10-CM

## 2017-02-08 HISTORY — DX: Anemia, unspecified: D64.9

## 2017-02-08 HISTORY — DX: Unspecified dementia, unspecified severity, without behavioral disturbance, psychotic disturbance, mood disturbance, and anxiety: F03.90

## 2017-02-08 HISTORY — DX: Unspecified hydronephrosis: N13.30

## 2017-02-08 NOTE — ED Provider Notes (Signed)
[facility]-EMERGENCY DEPT Provider Note   CSN: [id number] Arrival date & time: 02/08/17  0130  By signing my name below, I, Linna Darner, attest that this documentation has been prepared under the direction and in the presence of physician practitioner, Dione Booze, MD. Electronically Signed: Linna Darner, Scribe. 02/08/2017. 3:45 AM.  History   Chief Complaint Chief Complaint  Patient presents with  . pulled out foley cath    The history is provided by the patient and medical records. No language interpreter was used.     HPI Comments: LEVEL 5 CAVEAT DUE TO DEMENTIA [place] is a 83 y.o. male with PMHx including dementia who presents to the Emergency Department via EMS after pulling out his foley catheter shortly PTA. Per staff at his nursing facility, he was reported to be agitated, confused, and combative after pulling out his foley catheter. The catheter was inserted on 2/12. No other complaints noted at this time.  Past Medical History:  Diagnosis Date  . Anemia   . Dementia    with behavioral disturbance  . Hydronephrosis   . Hypertension   . SunDown syndrome     Patient Active Problem List   Diagnosis Date Noted  . Pressure injury of skin 01/30/2017  . Bladder outlet obstruction 01/30/2017  . Bilateral hydronephrosis 01/30/2017  . Cellulitis of right foot 01/29/2017  . Normocytic anemia 01/29/2017  . AKI (acute kidney injury) (HCC) 01/29/2017  . Open wound of left heel 01/29/2017  . HTN (hypertension), benign 11/25/2016  . Dementia with behavioral disturbance 11/25/2016  . Pedal edema 11/25/2016    Past Surgical History:  Procedure Laterality Date  . BYPASS GRAFT Right    artery bypass  . TIBIA FRACTURE SURGERY     R shin       Home Medications    Prior to Admission medications   Medication Sig Start Date End Date Taking? Authorizing Provider  Amino Acids-Protein Hydrolys (FEEDING SUPPLEMENT, PRO-STAT SUGAR FREE 64,) LIQD Take 30 mLs by mouth 2  (two) times daily. 02/02/17   Elease Etienne, MD  amLODipine (NORVASC) 5 MG tablet Take 1 tablet (5 mg total) by mouth daily. 02/03/17   Elease Etienne, MD  aspirin EC 81 MG tablet Take 81 mg by mouth daily.    Historical Provider, MD  atenolol (TENORMIN) 25 MG tablet Take 1 tablet (25 mg total) by mouth daily. 11/25/16   Pete Glatter, MD  bisacodyl (DULCOLAX) 5 MG EC tablet Take 5 mg by mouth daily as needed for moderate constipation.    Historical Provider, MD  doxycycline (VIBRA-TABS) 100 MG tablet Take 1 tablet (100 mg total) by mouth 2 (two) times daily. Discontinue after 02/03/17 doses. 02/02/17   Elease Etienne, MD  feeding supplement, ENSURE ENLIVE, (ENSURE ENLIVE) LIQD Take 237 mLs by mouth daily. 02/02/17   Elease Etienne, MD  Multiple Vitamin (MULTIVITAMIN WITH MINERALS) TABS tablet Take 1 tablet by mouth daily. 02/03/17   Elease Etienne, MD  mupirocin cream (BACTROBAN) 2 % Apply topically daily. Apply Bactroban to left foot wound Q day, then cover with foam dressing. (Change foam dressing Q 3 days or PRN soiling.)  Apply Bactroban to right leg scabbed areas Q day, leave open to air 02/03/17   Elease Etienne, MD  risperiDONE (RISPERDAL) 0.5 MG tablet Take 1 tablet (0.5 mg total) by mouth daily at 6 PM. 02/02/17   Elease Etienne, MD  tamsulosin (FLOMAX) 0.4 MG CAPS capsule Take 1 capsule (0.4  mg total) by mouth daily. 02/03/17   Elease EtienneAnand D Hongalgi, MD    Family History Family History  Problem Relation Age of Onset  . Cancer Mother   . Cancer Father     prostate    Social History Social History  Substance Use Topics  . Smoking status: Never Smoker  . Smokeless tobacco: Never Used  . 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  HENT:  Head: Normocephalic and atraumatic.  Eyes: EOM are normal. Pupils are equal, round, and reactive to light.  Neck: Normal range of motion. Neck supple. No JVD present.  Cardiovascular: Normal rate, regular rhythm and normal heart sounds.   No murmur heard. Pulmonary/Chest: Effort normal and breath sounds normal. He has no wheezes. He has no rales. He exhibits no tenderness.  Abdominal: Soft. Bowel sounds are normal. He exhibits no distension and no mass. There is no tenderness.  Musculoskeletal: Normal range of motion. He exhibits no edema.  Chronic scarring of the right distal lower leg and foot.  Lymphadenopathy:    He has no cervical adenopathy.  Neurological: He is alert. No cranial nerve deficit. He exhibits normal muscle tone. Coordination normal.  Oriented to person and place but not time.  Skin: Skin is warm and dry. No rash noted.  Psychiatric: He has a normal mood and affect. Judgment and thought content normal. He is agitated.  Intermittently agitated.  Nursing note and vitals reviewed.    ED Treatments / Results   Procedures Procedures (including critical care time)  DIAGNOSTIC STUDIES: Oxygen Saturation is 98% on RA, normal by my interpretation.    Medications Ordered in ED Medications - No data to display   Initial Impression / Assessment and Plan / ED Course  I have reviewed the triage vital signs and the nursing notes.  Foley catheter pulled out by patient. Old records are reviewed, and catheter been placed because of bladder outlet obstruction with development of renal failure. At this point, I do not feel it is appropriate to leave the catheter out. Catheter is placed without difficulty and he is sent back to his skilled care center with follow-up with his urologist.  Final Clinical Impressions(s) / ED Diagnoses   Final diagnoses:   Dislodged Foley catheter, initial encounter (HCC)  Dementia with behavioral disturbance, unspecified dementia type    New Prescriptions New Prescriptions   No medications on file   I personally performed the services described in this documentation, which was scribed in my presence. The recorded information has been reviewed and is accurate.      Dione Boozeavid Layth Cerezo, MD 02/08/17 [phone]

## 2017-02-08 NOTE — ED Triage Notes (Signed)
Per EMS , pt. From [facility] with complaint of pulled out foley catheter. Per facility staff , pt. Was reported to be agitated ,confused and combative after pulling foley out last night at 11pm. No bleeding reported. Pt. Had foley inserted on 02/03/17 due to bladder neck obstruction . Has dementia with behavioral disturbance. Uncooperative.

## 2017-02-08 NOTE — ED Notes (Signed)
Bed: [id number] Expected date:  Expected time:  Means of arrival:  Comments: Pulled out catheter

## 2017-02-08 NOTE — ED Notes (Signed)
Pt. Confused, restless, uncooperative. Attempted to get out of bed many times. Pt.  Reoriented , safety and comfort measures applied, attention diverted. Bed alarm attached. Pt. Still noted to be more anxious and became agitated. [place] due to high risk of fall.

## 2017-02-08 NOTE — ED Notes (Signed)
ED Provider at bedside. 

## 2017-02-10 ENCOUNTER — Encounter: Payer: Self-pay | Admitting: Internal Medicine

## 2017-02-10 NOTE — Progress Notes (Signed)
Patient ID: [place], male   DOB: Mar 20, 1933, 84 y.o.   MRN: [id number]    DATE: 02/10/2017  Location:    [facility] Room Number: Story of Service: SNF (31)   Extended Emergency Contact Information Primary Emergency Contact: Cross,Jonathan Address: [address]          [place], [place] [address] [name] of [place] Phone: [phone] Mobile Phone: [phone] Relation: Son Secondary Emergency Contact: Orland Mustard [place] Phone: [phone] Mobile Phone: [phone] Relation: Friend  Advanced Directive information Does Patient Have a Medical Advance Directive?: Yes, Type of Advance Directive: Healthcare Power of Attorney  Chief Complaint  Patient presents with  . Acute Visit    Falls    HPI:    Past Medical History:  Diagnosis Date  . Anemia   . Dementia    with behavioral disturbance  . Hydronephrosis   . Hypertension   . SunDown syndrome     Past Surgical History:  Procedure Laterality Date  . BYPASS GRAFT Right    artery bypass  . TIBIA FRACTURE SURGERY     R shin    Patient Care Team: Maren Reamer, MD as PCP - General (Internal Medicine)  Social History   Social History  . Marital status: Widowed    Spouse name: N/A  . Number of children: 2  . Years of education: 12   Occupational History  .      career Social research officer, government   Social History Main Topics  . Smoking status: Never Smoker  . Smokeless tobacco: Never Used  . 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 Drug use: No  . Sexual activity: Not on file   Other Topics Concern  . Not on file   Social History Narrative   Son lives with patient sometimes   Caffeine- none     reports that he has never smoked. He has never used smokeless tobacco. He reports that he does not drink alcohol or use drugs.  Family History  Problem Relation Age of Onset  . Cancer Mother   . Cancer Father     prostate   Family  Status  Relation Status  . Mother Deceased  . Father Deceased    Immunization History  Administered Date(s) Administered  . Influenza,inj,Quad PF,36+ Mos 11/25/2016    Allergies  Allergen Reactions  . Codeine Anxiety  . Hydroxyzine Other (See Comments)    Makes things worse  . Seroquel [Quetiapine] Other (See Comments)    Makes things worse    Medications: Patient's Medications  New Prescriptions   No medications on file  Previous Medications   AMINO ACIDS-PROTEIN HYDROLYS (FEEDING SUPPLEMENT, PRO-STAT SUGAR FREE 64,) LIQD    Take 30 mLs by mouth 2 (two) times daily.   AMLODIPINE (NORVASC) 5 MG TABLET    Take 1 tablet (5 mg total) by mouth daily.   ASPIRIN EC 81 MG TABLET    Take 81 mg by mouth daily.   ATENOLOL (TENORMIN) 25 MG TABLET    Take 1 tablet (25 mg total) by mouth daily.   BISACODYL (DULCOLAX) 5 MG EC TABLET    Take 5 mg by mouth daily as needed for moderate constipation.   MULTIPLE VITAMIN (MULTIVITAMIN WITH MINERALS) TABS TABLET    Take 1 tablet by mouth daily.   RISPERIDONE (RISPERDAL) 0.5 MG TABLET    Take 1 tablet (0.5 mg total) by mouth daily  at 6 PM.   TAMSULOSIN (FLOMAX) 0.4 MG CAPS CAPSULE    Take 1 capsule (0.4 mg total) by mouth daily.  Modified Medications   No medications on file  Discontinued Medications   DOXYCYCLINE (VIBRA-TABS) 100 MG TABLET    Take 1 tablet (100 mg total) by mouth 2 (two) times daily. Discontinue after 02/03/17 doses.   FEEDING SUPPLEMENT, ENSURE ENLIVE, (ENSURE ENLIVE) LIQD    Take 237 mLs by mouth daily.   MUPIROCIN CREAM (BACTROBAN) 2 %    Apply topically daily. Apply Bactroban to left foot wound Q day, then cover with foam dressing. (Change foam dressing Q 3 days or PRN soiling.)  Apply Bactroban to right leg scabbed areas Q day, leave open to air    Review of Systems  Vitals:   02/10/17 1432  BP: 132/70  Pulse: 80  Resp: 18  Temp: 97.5 F (36.4 C)  TempSrc: Oral  SpO2: 97%   There is no height or weight on file to  calculate BMI.  Physical Exam   Labs reviewed: Admission on 01/28/2017, Discharged on 02/02/2017  Component Date Value Ref Range Status  . Lactic Acid, Venous 01/28/2017 0.82  0.5 - 1.9 mmol/L Final  . Sodium 01/28/2017 145  135 - 145 mmol/L Final  . Potassium 01/28/2017 3.5  3.5 - 5.1 mmol/L Final  . Chloride 01/28/2017 109  101 - 111 mmol/L Final  . CO2 01/28/2017 29  22 - 32 mmol/L Final  . Glucose, Bld 01/28/2017 95  65 - 99 mg/dL Final  . BUN 01/28/2017 16  6 - 20 mg/dL Final  . Creatinine, Ser 01/28/2017 1.65* 0.61 - 1.24 mg/dL Final  . Calcium 01/28/2017 8.7* 8.9 - 10.3 mg/dL Final  . Total Protein 01/28/2017 6.2* 6.5 - 8.1 g/dL Final  . Albumin 01/28/2017 3.0* 3.5 - 5.0 g/dL Final  . AST 01/28/2017 21  15 - 41 U/L Final  . ALT 01/28/2017 11* 17 - 63 U/L Final  . Alkaline Phosphatase 01/28/2017 45  38 - 126 U/L Final  . Total Bilirubin 01/28/2017 0.5  0.3 - 1.2 mg/dL Final  . GFR calc non Af Amer 01/28/2017 37* >60 mL/min Final  . GFR calc Af Amer 01/28/2017 43* >60 mL/min Final   Comment: (NOTE) The eGFR has been calculated using the CKD EPI equation. This calculation has not been validated in all clinical situations. eGFR's persistently <60 mL/min signify possible Chronic Kidney Disease.   . Anion gap 01/28/2017 7  5 - 15 Final  . WBC 01/28/2017 9.4  4.0 - 10.5 K/uL Final  . RBC 01/28/2017 3.70* 4.22 - 5.81 MIL/uL Final  . Hemoglobin 01/28/2017 8.8* 13.0 - 17.0 g/dL Final  . HCT 01/28/2017 29.5* 39.0 - 52.0 % Final  . MCV 01/28/2017 79.7  78.0 - 100.0 fL Final  . MCH 01/28/2017 23.8* 26.0 - 34.0 pg Final  . MCHC 01/28/2017 29.8* 30.0 - 36.0 g/dL Final  . RDW 01/28/2017 14.8  11.5 - 15.5 % Final  . Platelets 01/28/2017 378  150 - 400 K/uL Final  . Neutrophils Relative % 01/28/2017 73  % Final  . Neutro Abs 01/28/2017 6.9  1.7 - 7.7 K/uL Final  . Lymphocytes Relative 01/28/2017 17  % Final  . Lymphs Abs 01/28/2017 1.6  0.7 - 4.0 K/uL Final  . Monocytes Relative  01/28/2017 8  % Final  . Monocytes Absolute 01/28/2017 0.7  0.1 - 1.0 K/uL Final  . Eosinophils Relative 01/28/2017 2  % Final  .  Eosinophils Absolute 01/28/2017 0.2  0.0 - 0.7 K/uL Final  . Basophils Relative 01/28/2017 0  % Final  . Basophils Absolute 01/28/2017 0.0  0.0 - 0.1 K/uL Final  . Color, Urine 01/29/2017 YELLOW  YELLOW Final  . APPearance 01/29/2017 CLEAR  CLEAR Final  . Specific Gravity, Urine 01/29/2017 1.008  1.005 - 1.030 Final  . pH 01/29/2017 6.0  5.0 - 8.0 Final  . Glucose, UA 01/29/2017 NEGATIVE  NEGATIVE mg/dL Final  . Hgb urine dipstick 01/29/2017 SMALL* NEGATIVE Final  . Bilirubin Urine 01/29/2017 NEGATIVE  NEGATIVE Final  . Ketones, ur 01/29/2017 NEGATIVE  NEGATIVE mg/dL Final  . Protein, ur 01/29/2017 NEGATIVE  NEGATIVE mg/dL Final  . Nitrite 01/29/2017 NEGATIVE  NEGATIVE Final  . Leukocytes, UA 01/29/2017 NEGATIVE  NEGATIVE Final  . RBC / HPF 01/29/2017 0-5  0 - 5 RBC/hpf Final  . WBC, UA 01/29/2017 0-5  0 - 5 WBC/hpf Final  . Bacteria, UA 01/29/2017 NONE SEEN  NONE SEEN Final  . Squamous Epithelial / LPF 01/29/2017 NONE SEEN  NONE SEEN Final  . Lactic Acid, Venous 01/28/2017 1.15  0.5 - 1.9 mmol/L Final  . Specimen Description 01/29/2017 BLOOD LEFT FOREARM   Final  . Special Requests 01/29/2017 AEROBIC BOTTLE ONLY 5ML   Final  . Culture 01/29/2017 NO GROWTH 5 DAYS   Final  . Report Status 01/29/2017 02/03/2017 FINAL   Final  . Specimen Description 01/29/2017 BLOOD RIGHT WRIST   Final  . Special Requests 01/29/2017 IN PEDIATRIC BOTTLE 3ML   Final  . Culture 01/29/2017 NO GROWTH 5 DAYS   Final  . Report Status 01/29/2017 02/03/2017 FINAL   Final  . Fecal Occult Bld 01/29/2017 NEGATIVE  NEGATIVE Final  . Creatinine, Urine 01/29/2017 64.19  mg/dL Final  . Sodium, Ur 01/29/2017 27  mmol/L Final  . Sodium 01/29/2017 145  135 - 145 mmol/L Final  . Potassium 01/29/2017 3.8  3.5 - 5.1 mmol/L Final  . Chloride 01/29/2017 106  101 - 111 mmol/L Final  . CO2  01/29/2017 26  22 - 32 mmol/L Final  . Glucose, Bld 01/29/2017 92  65 - 99 mg/dL Final  . BUN 01/29/2017 17  6 - 20 mg/dL Final  . Creatinine, Ser 01/29/2017 1.71* 0.61 - 1.24 mg/dL Final  . Calcium 01/29/2017 8.2* 8.9 - 10.3 mg/dL Final  . GFR calc non Af Amer 01/29/2017 36* >60 mL/min Final  . GFR calc Af Amer 01/29/2017 41* >60 mL/min Final   Comment: (NOTE) The eGFR has been calculated using the CKD EPI equation. This calculation has not been validated in all clinical situations. eGFR's persistently <60 mL/min signify possible Chronic Kidney Disease.   . Anion gap 01/29/2017 13  5 - 15 Final  . WBC 01/29/2017 10.1  4.0 - 10.5 K/uL Final  . RBC 01/29/2017 3.25* 4.22 - 5.81 MIL/uL Final  . Hemoglobin 01/29/2017 7.7* 13.0 - 17.0 g/dL Final  . HCT 01/29/2017 25.9* 39.0 - 52.0 % Final  . MCV 01/29/2017 79.7  78.0 - 100.0 fL Final  . MCH 01/29/2017 23.7* 26.0 - 34.0 pg Final  . MCHC 01/29/2017 29.7* 30.0 - 36.0 g/dL Final  . RDW 01/29/2017 14.8  11.5 - 15.5 % Final  . Platelets 01/29/2017 318  150 - 400 K/uL Final  . Sed Rate 01/29/2017 55* 0 - 16 mm/hr Final  . CRP 01/29/2017 6.3* <1.0 mg/dL Final  . WBC 01/30/2017 12.6* 4.0 - 10.5 K/uL Final  . RBC 01/30/2017 3.72* 4.22 -  5.81 MIL/uL Final  . Hemoglobin 01/30/2017 8.8* 13.0 - 17.0 g/dL Final  . HCT 01/30/2017 29.6* 39.0 - 52.0 % Final  . MCV 01/30/2017 79.6  78.0 - 100.0 fL Final  . MCH 01/30/2017 23.7* 26.0 - 34.0 pg Final  . MCHC 01/30/2017 29.7* 30.0 - 36.0 g/dL Final  . RDW 01/30/2017 15.2  11.5 - 15.5 % Final  . Platelets 01/30/2017 404* 150 - 400 K/uL Final  . Sodium 01/30/2017 146* 135 - 145 mmol/L Final  . Potassium 01/30/2017 3.3* 3.5 - 5.1 mmol/L Final  . Chloride 01/30/2017 107  101 - 111 mmol/L Final  . CO2 01/30/2017 23  22 - 32 mmol/L Final  . Glucose, Bld 01/30/2017 112* 65 - 99 mg/dL Final  . BUN 01/30/2017 20  6 - 20 mg/dL Final  . Creatinine, Ser 01/30/2017 2.28* 0.61 - 1.24 mg/dL Final  . Calcium 01/30/2017  8.7* 8.9 - 10.3 mg/dL Final  . GFR calc non Af Amer 01/30/2017 25* >60 mL/min Final  . GFR calc Af Amer 01/30/2017 29* >60 mL/min Final   Comment: (NOTE) The eGFR has been calculated using the CKD EPI equation. This calculation has not been validated in all clinical situations. eGFR's persistently <60 mL/min signify possible Chronic Kidney Disease.   . Anion gap 01/30/2017 16* 5 - 15 Final  . Sodium, Ur 01/30/2017 88  mmol/L Final  . Creatinine, Urine 01/30/2017 26.09  mg/dL Final  . Sodium 01/31/2017 144  135 - 145 mmol/L Final  . Potassium 01/31/2017 3.5  3.5 - 5.1 mmol/L Final  . Chloride 01/31/2017 106  101 - 111 mmol/L Final  . CO2 01/31/2017 24  22 - 32 mmol/L Final  . Glucose, Bld 01/31/2017 125* 65 - 99 mg/dL Final  . BUN 01/31/2017 20  6 - 20 mg/dL Final  . Creatinine, Ser 01/31/2017 1.94* 0.61 - 1.24 mg/dL Final  . Calcium 01/31/2017 8.8* 8.9 - 10.3 mg/dL Final  . GFR calc non Af Amer 01/31/2017 31* >60 mL/min Final  . GFR calc Af Amer 01/31/2017 36* >60 mL/min Final   Comment: (NOTE) The eGFR has been calculated using the CKD EPI equation. This calculation has not been validated in all clinical situations. eGFR's persistently <60 mL/min signify possible Chronic Kidney Disease.   . Anion gap 01/31/2017 14  5 - 15 Final  . WBC 01/31/2017 12.3* 4.0 - 10.5 K/uL Final  . RBC 01/31/2017 3.72* 4.22 - 5.81 MIL/uL Final  . Hemoglobin 01/31/2017 8.9* 13.0 - 17.0 g/dL Final  . HCT 01/31/2017 29.4* 39.0 - 52.0 % Final  . MCV 01/31/2017 79.0  78.0 - 100.0 fL Final  . MCH 01/31/2017 23.9* 26.0 - 34.0 pg Final  . MCHC 01/31/2017 30.3  30.0 - 36.0 g/dL Final  . RDW 01/31/2017 15.3  11.5 - 15.5 % Final  . Platelets 01/31/2017 366  150 - 400 K/uL Final  . Color, Urine 01/30/2017 RED* YELLOW Final  . APPearance 01/30/2017 TURBID* CLEAR Final  . Specific Gravity, Urine 01/30/2017 TEST NOT REPORTED DUE TO COLOR INTERFERENCE OF URINE PIGMENT  1.005 - 1.030 Final  . pH 01/30/2017 TEST  NOT REPORTED DUE TO COLOR INTERFERENCE OF URINE PIGMENT  5.0 - 8.0 Final  . Glucose, UA 01/30/2017 TEST NOT REPORTED DUE TO COLOR INTERFERENCE OF URINE PIGMENT* NEGATIVE mg/dL Final  . Hgb urine dipstick 01/30/2017 TEST NOT REPORTED DUE TO COLOR INTERFERENCE OF URINE PIGMENT* NEGATIVE Final  . Bilirubin Urine 01/30/2017 TEST NOT REPORTED DUE TO COLOR INTERFERENCE OF  URINE PIGMENT* NEGATIVE Final  . Ketones, ur 01/30/2017 TEST NOT REPORTED DUE TO COLOR INTERFERENCE OF URINE PIGMENT* NEGATIVE mg/dL Final  . Protein, ur 01/30/2017 TEST NOT REPORTED DUE TO COLOR INTERFERENCE OF URINE PIGMENT* NEGATIVE mg/dL Final  . Nitrite 01/30/2017 TEST NOT REPORTED DUE TO COLOR INTERFERENCE OF URINE PIGMENT* NEGATIVE Final  . Leukocytes, UA 01/30/2017 TEST NOT REPORTED DUE TO COLOR INTERFERENCE OF URINE PIGMENT* NEGATIVE Final  . RBC / HPF 01/30/2017 TOO NUMEROUS TO COUNT  0 - 5 RBC/hpf Final  . WBC, UA 01/30/2017 TOO NUMEROUS TO COUNT  0 - 5 WBC/hpf Final  . Bacteria, UA 01/30/2017 MANY* NONE SEEN Final  . Squamous Epithelial / LPF 01/30/2017 NONE SEEN  NONE SEEN Final  . Color, Urine 01/31/2017 RED* YELLOW Final  . APPearance 01/31/2017 TURBID* CLEAR Final  . Specific Gravity, Urine 01/31/2017 TEST NOT REPORTED DUE TO COLOR INTERFERENCE OF URINE PIGMENT  1.005 - 1.030 Final  . pH 01/31/2017 TEST NOT REPORTED DUE TO COLOR INTERFERENCE OF URINE PIGMENT  5.0 - 8.0 Final  . Glucose, UA 01/31/2017 TEST NOT REPORTED DUE TO COLOR INTERFERENCE OF URINE PIGMENT* NEGATIVE mg/dL Final  . Hgb urine dipstick 01/31/2017 TEST NOT REPORTED DUE TO COLOR INTERFERENCE OF URINE PIGMENT* NEGATIVE Final  . Bilirubin Urine 01/31/2017 TEST NOT REPORTED DUE TO COLOR INTERFERENCE OF URINE PIGMENT* NEGATIVE Final  . Ketones, ur 01/31/2017 TEST NOT REPORTED DUE TO COLOR INTERFERENCE OF URINE PIGMENT* NEGATIVE mg/dL Final  . Protein, ur 01/31/2017 TEST NOT REPORTED DUE TO COLOR INTERFERENCE OF URINE PIGMENT* NEGATIVE mg/dL Final  .  Nitrite 01/31/2017 TEST NOT REPORTED DUE TO COLOR INTERFERENCE OF URINE PIGMENT* NEGATIVE Final  . Leukocytes, UA 01/31/2017 TEST NOT REPORTED DUE TO COLOR INTERFERENCE OF URINE PIGMENT* NEGATIVE Final  . RBC / HPF 01/31/2017 TOO NUMEROUS TO COUNT  0 - 5 RBC/hpf Final  . WBC, UA 01/31/2017 TOO NUMEROUS TO COUNT  0 - 5 WBC/hpf Final  . Bacteria, UA 01/31/2017 FEW* NONE SEEN Final  . Squamous Epithelial / LPF 01/31/2017 NONE SEEN  NONE SEEN Final  . Mucous 01/31/2017 PRESENT   Final  . Specimen Description 01/31/2017 URINE, RANDOM   Final  . Special Requests 01/31/2017 NONE   Final  . Culture 01/31/2017 NO GROWTH   Final  . Report Status 01/31/2017 02/01/2017 FINAL   Final  . Sodium 02/01/2017 143  135 - 145 mmol/L Final  . Potassium 02/01/2017 2.9* 3.5 - 5.1 mmol/L Final  . Chloride 02/01/2017 106  101 - 111 mmol/L Final  . CO2 02/01/2017 28  22 - 32 mmol/L Final  . Glucose, Bld 02/01/2017 108* 65 - 99 mg/dL Final  . BUN 02/01/2017 12  6 - 20 mg/dL Final  . Creatinine, Ser 02/01/2017 1.34* 0.61 - 1.24 mg/dL Final  . Calcium 02/01/2017 8.5* 8.9 - 10.3 mg/dL Final  . GFR calc non Af Amer 02/01/2017 48* >60 mL/min Final  . GFR calc Af Amer 02/01/2017 56* >60 mL/min Final   Comment: (NOTE) The eGFR has been calculated using the CKD EPI equation. This calculation has not been validated in all clinical situations. eGFR's persistently <60 mL/min signify possible Chronic Kidney Disease.   . Anion gap 02/01/2017 9  5 - 15 Final  . Magnesium 02/01/2017 1.7  1.7 - 2.4 mg/dL Final  . Sodium 02/02/2017 142  135 - 145 mmol/L Final  . Potassium 02/02/2017 3.2* 3.5 - 5.1 mmol/L Final  . Chloride 02/02/2017 103  101 - 111 mmol/L Final  .  CO2 02/02/2017 29  22 - 32 mmol/L Final  . Glucose, Bld 02/02/2017 99  65 - 99 mg/dL Final  . BUN 02/02/2017 16  6 - 20 mg/dL Final  . Creatinine, Ser 02/02/2017 1.20  0.61 - 1.24 mg/dL Final  . Calcium 02/02/2017 8.6* 8.9 - 10.3 mg/dL Final  . GFR calc non Af  Amer 02/02/2017 55* >60 mL/min Final  . GFR calc Af Amer 02/02/2017 >60  >60 mL/min Final   Comment: (NOTE) The eGFR has been calculated using the CKD EPI equation. This calculation has not been validated in all clinical situations. eGFR's persistently <60 mL/min signify possible Chronic Kidney Disease.   . Anion gap 02/02/2017 10  5 - 15 Final  Office Visit on 11/25/2016  Component Date Value Ref Range Status  . Ferritin 11/25/2016 99  20 - 380 ng/mL Final  . Iron 11/25/2016 32* 50 - 180 ug/dL Final  . TIBC 11/25/2016 331  250 - 425 ug/dL Final  . %SAT 11/25/2016 10* 15 - 60 % Final  . WBC 11/25/2016 9.6  3.8 - 10.8 K/uL Final  . RBC 11/25/2016 4.62  4.20 - 5.80 MIL/uL Final  . Hemoglobin 11/25/2016 11.7* 13.2 - 17.1 g/dL Final  . HCT 11/25/2016 38.6  38.5 - 50.0 % Final  . MCV 11/25/2016 83.5  80.0 - 100.0 fL Final  . MCH 11/25/2016 25.3* 27.0 - 33.0 pg Final  . MCHC 11/25/2016 30.3* 32.0 - 36.0 g/dL Final  . RDW 11/25/2016 14.8  11.0 - 15.0 % Final  . Platelets 11/25/2016 287  140 - 400 K/uL Final  . MPV 11/25/2016 9.7  7.5 - 12.5 fL Final  . Neutro Abs 11/25/2016 7776  1,500 - 7,800 cells/uL Final  . Lymphs Abs 11/25/2016 1056  850 - 3,900 cells/uL Final  . Monocytes Absolute 11/25/2016 672  200 - 950 cells/uL Final  . Eosinophils Absolute 11/25/2016 96  15 - 500 cells/uL Final  . Basophils Absolute 11/25/2016 0  0 - 200 cells/uL Final  . Neutrophils Relative % 11/25/2016 81  % Final  . Lymphocytes Relative 11/25/2016 11  % Final  . Monocytes Relative 11/25/2016 7  % Final  . Eosinophils Relative 11/25/2016 1  % Final  . Basophils Relative 11/25/2016 0  % Final  . Smear Review 11/25/2016 Criteria for review not met   Final  . Sodium 11/25/2016 142  135 - 146 mmol/L Final  . Potassium 11/25/2016 4.2  3.5 - 5.3 mmol/L Final  . Chloride 11/25/2016 102  98 - 110 mmol/L Final  . CO2 11/25/2016 32* 20 - 31 mmol/L Final  . Glucose, Bld 11/25/2016 102* 65 - 99 mg/dL Final  . BUN  11/25/2016 18  7 - 25 mg/dL Final  . Creat 11/25/2016 0.82  0.70 - 1.11 mg/dL Final   Comment:   For patients > or = 81 years of age: The upper reference limit for Creatinine is approximately 13% higher for people identified as African-American.     . Calcium 11/25/2016 9.4  8.6 - 10.3 mg/dL Final  . GFR, Est African American 11/25/2016 >89  >=60 mL/min Final  . GFR, Est Non African American 11/25/2016 83  >=60 mL/min Final  . Vit D, 25-Hydroxy 11/25/2016 25* 30 - 100 ng/mL Final   Comment: Vitamin D Status           25-OH Vitamin D        Deficiency                <  20 ng/mL        Insufficiency         20 - 29 ng/mL        Optimal             > or = 30 ng/mL   For 25-OH Vitamin D testing on patients on D2-supplementation and patients for whom quantitation of D2 and D3 fractions is required, the QuestAssureD 25-OH VIT D, (D2,D3), LC/MS/MS is recommended: order code [id number] (patients > 2 yrs).   . Brain Natriuretic Peptide 11/25/2016 149.5* <100 pg/mL Final   Comment:   BNP levels increase with age in the general population with the highest values seen in individuals greater than 41 years of age. Reference: Joellyn Rued Cardiol 2002; 76:283-15.     . Vitamin B-12 11/25/2016 335  200 - 1,100 pg/mL Final  . Folate 11/25/2016 14.2  >5.4 ng/mL Final   Comment: Reference Range >17 years:   Low: <3.4 ng/mL              Borderline: 3.4-5.4 ng/mL              Normal: >5.4 ng/mL     . RPR Ser Ql 11/25/2016 NON REAC  NON REAC Final    US Renal  Result Date: 01/30/2017 CLINICAL DATA:  Acute kidney injury. EXAM: RENAL / URINARY TRACT ULTRASOUND COMPLETE COMPARISON:  None. FINDINGS: Right Kidney: Length: 11.2 cm. Echogenicity is increased. Moderate hydronephrosis visualized. Left Kidney: Length: 10.8. Echogenicity is increased. Moderate hydronephrosis visualized. Bladder: Irregular, trabeculated appearance of the bladder wall is seen. IMPRESSION: Moderate bilateral hydronephrosis. Increased  cortical echogenicity bilaterally consistent with medical renal disease. Trabeculated appearance of the urinary bladder wall suggestive of bladder outlet obstruction. Electronically Signed   By: Inge Rise M.D.   On: 01/30/2017 09:26     Assessment/Plan    Monica S. Perlie Gold  [facility] [place], [place] [address] [phone] Cell (Monday-Friday 8 AM - 5 PM) [phone] After 5 PM and follow prompts  This encounter was created in error - please disregard.

## 2017-02-19 ENCOUNTER — Non-Acute Institutional Stay (SKILLED_NURSING_FACILITY): Payer: Medicare Other | Admitting: Adult Health

## 2017-02-19 ENCOUNTER — Encounter: Payer: Self-pay | Admitting: Adult Health

## 2017-02-19 DIAGNOSIS — R509 Fever, unspecified: Secondary | ICD-10-CM

## 2017-02-19 NOTE — Progress Notes (Signed)
Location:   [facility] Room Number: 119 B Place of Service:  SNF (31)   CODE STATUS:  Full code  Allergies  Allergen Reactions  . Codeine Anxiety  . Hydroxyzine Other (See Comments)    Makes things worse  . Seroquel [Quetiapine] Other (See Comments)    Makes things worse    Chief Complaint  Patient presents with  . Acute Visit    HPI:  He has a fever of 102 (ax) and a pulse of 120. He is not verbally responsive. Staff reports that his po intake has been very poor the past couple of days.   Past Medical History:  Diagnosis Date  . Anemia   . Dementia    with behavioral disturbance  . Hydronephrosis   . Hypertension   . SunDown syndrome     Past Surgical History:  Procedure Laterality Date  . BYPASS GRAFT Right    artery bypass  . TIBIA FRACTURE SURGERY     R shin    Social History   Social History  . Marital status: Widowed    Spouse name: N/A  . Number of children: 2  . Years of education: 12   Occupational History  .      career [employer]   Social History Main Topics  . Smoking status: Never Smoker  . Smokeless tobacco: Never Used  . 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 Drug use: No  . Sexual activity: Not on file   Other Topics Concern  . Not on file   Social History Narrative   Son lives with patient sometimes   Caffeine- none   Family History  Problem Relation Age of Onset  . Cancer Mother   . Cancer Father     prostate      VITAL SIGNS BP (!) 148/70   Pulse 82   Temp 97.2 F (36.2 C)   Resp 20   Ht 5\' 10"  (1.778 m)   Wt 182 lb (82.6 kg)   SpO2 97%   BMI 26.11 kg/m   Patient's Medications  New Prescriptions   No medications on file  Previous Medications   AMINO ACIDS-PROTEIN HYDROLYS (FEEDING SUPPLEMENT, PRO-STAT SUGAR FREE 64,) LIQD    Take 30 mLs by mouth 2 (two) times daily.   AMLODIPINE (NORVASC) 5 MG TABLET    Take 1 tablet (5 mg total) by mouth daily.   ASPIRIN EC 81 MG TABLET     Take 81 mg by mouth daily.   ATENOLOL (TENORMIN) 25 MG TABLET    Take 1 tablet (25 mg total) by mouth daily.   BISACODYL (DULCOLAX) 5 MG EC TABLET    Take 5 mg by mouth daily as needed for moderate constipation.   LORAZEPAM (ATIVAN) 2 MG/ML INJECTION    Inject 0.5 ml intramuscular every 12 hours as needed   MULTIPLE VITAMIN (MULTIVITAMIN WITH MINERALS) TABS TABLET    Take 1 tablet by mouth daily.   RISPERIDONE (RISPERDAL) 0.5 MG TABLET    Take 0.5 mg by mouth 2 (two) times daily.   TAMSULOSIN (FLOMAX) 0.4 MG CAPS CAPSULE    Take 1 capsule (0.4 mg total) by mouth daily.   UNABLE TO FIND    House 2.0 one time a day 90 cc  Modified Medications   No medications on file  Discontinued Medications   RISPERIDONE (RISPERDAL) 0.5 MG TABLET    Take 1 tablet (0.5 mg total) by mouth daily  at 6 PM.     SIGNIFICANT DIAGNOSTIC EXAMS  01-30-17: renal ultrasound: Moderate bilateral hydronephrosis. Increased cortical echogenicity bilaterally consistent with medical renal disease. Trabeculated appearance of the urinary bladder wall suggestive of bladder outlet obstruction.   LABS REVIEWED:   01-31-17: wbc 1.3; hgb 8.9; hct 29.4; mcv 79.0; plt 366; glucose 125; bun 20; creat 1.94; k+ 3.5; na++ 144    Review of Systems  Unable to perform ROS: Dementia    Physical Exam  Constitutional: No distress. Frail  Eyes: Conjunctivae are normal.  Neck: Neck supple. No JVD present. No thyromegaly present.  Cardiovascular: Normal rate, regular rhythm and intact distal pulses.   Respiratory: breath sounds diminished  GI: Soft. Bowel sounds are normal. He exhibits no distension. There is no tenderness.  Genitourinary:  Genitourinary Comments: Has foley   Musculoskeletal: He exhibits no edema. Lymphadenopathy:    He has no cervical adenopathy.  Neurological: not aware  Skin: Skin is warm and dry. He is not diaphoretic.    ASSESSMENT/ PLAN:  1. FUO: will begin him on rocephin 1 gm IV daily for 10 days;  will begin NS at 100 cc per hour for 2 liters; will check cbcp cmp; blood cultures X2 sites; chest x-ray; UA/c&s    Time spent with patient  45  minutes >50% time spent counseling; reviewing medical record; tests; labs; and developing future plan of care   MD is aware of resident's narcotic use and is in agreement with current plan of care. We will attempt to wean resident as apropriate   Synthia Innocent NP [facility]  Contact [phone] Monday through Friday 8am- 5pm  After hours call [phone]

## 2017-02-20 ENCOUNTER — Encounter: Payer: Self-pay | Admitting: Internal Medicine

## 2017-02-20 ENCOUNTER — Emergency Department (HOSPITAL_COMMUNITY): Payer: Medicare Other

## 2017-02-20 ENCOUNTER — Non-Acute Institutional Stay (SKILLED_NURSING_FACILITY): Payer: Medicare Other | Admitting: Internal Medicine

## 2017-02-20 ENCOUNTER — Encounter (HOSPITAL_COMMUNITY): Payer: Self-pay | Admitting: Emergency Medicine

## 2017-02-20 ENCOUNTER — Inpatient Hospital Stay (HOSPITAL_COMMUNITY)
Admission: EM | Admit: 2017-02-20 | Discharge: 2017-02-27 | DRG: 698 | Disposition: A | Discharge status: Hospice | Payer: Medicare Other | Attending: Family Medicine | Admitting: Family Medicine

## 2017-02-20 DIAGNOSIS — F0391 Unspecified dementia with behavioral disturbance: Secondary | ICD-10-CM | POA: Diagnosis present

## 2017-02-20 DIAGNOSIS — R6520 Severe sepsis without septic shock: Secondary | ICD-10-CM | POA: Diagnosis present

## 2017-02-20 DIAGNOSIS — A4151 Sepsis due to Escherichia coli [E. coli]: Secondary | ICD-10-CM | POA: Diagnosis present

## 2017-02-20 DIAGNOSIS — D649 Anemia, unspecified: Secondary | ICD-10-CM | POA: Diagnosis present

## 2017-02-20 DIAGNOSIS — A419 Sepsis, unspecified organism: Secondary | ICD-10-CM | POA: Diagnosis present

## 2017-02-20 DIAGNOSIS — R7881 Bacteremia: Secondary | ICD-10-CM | POA: Diagnosis not present

## 2017-02-20 DIAGNOSIS — T83511D Infection and inflammatory reaction due to indwelling urethral catheter, subsequent encounter: Secondary | ICD-10-CM | POA: Diagnosis not present

## 2017-02-20 DIAGNOSIS — T83511A Infection and inflammatory reaction due to indwelling urethral catheter, initial encounter: Secondary | ICD-10-CM | POA: Diagnosis present

## 2017-02-20 DIAGNOSIS — Y95 Nosocomial condition: Secondary | ICD-10-CM | POA: Diagnosis present

## 2017-02-20 DIAGNOSIS — Z7982 Long term (current) use of aspirin: Secondary | ICD-10-CM

## 2017-02-20 DIAGNOSIS — J690 Pneumonitis due to inhalation of food and vomit: Secondary | ICD-10-CM | POA: Diagnosis present

## 2017-02-20 DIAGNOSIS — L89159 Pressure ulcer of sacral region, unspecified stage: Secondary | ICD-10-CM | POA: Diagnosis present

## 2017-02-20 DIAGNOSIS — Z888 Allergy status to other drugs, medicaments and biological substances status: Secondary | ICD-10-CM

## 2017-02-20 DIAGNOSIS — E870 Hyperosmolality and hypernatremia: Secondary | ICD-10-CM | POA: Diagnosis present

## 2017-02-20 DIAGNOSIS — N183 Chronic kidney disease, stage 3 (moderate): Secondary | ICD-10-CM | POA: Diagnosis present

## 2017-02-20 DIAGNOSIS — R000 Tachycardia, unspecified: Secondary | ICD-10-CM | POA: Diagnosis not present

## 2017-02-20 DIAGNOSIS — G9341 Metabolic encephalopathy: Secondary | ICD-10-CM | POA: Diagnosis present

## 2017-02-20 DIAGNOSIS — F03918 Unspecified dementia, unspecified severity, with other behavioral disturbance: Secondary | ICD-10-CM | POA: Diagnosis present

## 2017-02-20 DIAGNOSIS — N320 Bladder-neck obstruction: Secondary | ICD-10-CM | POA: Diagnosis present

## 2017-02-20 DIAGNOSIS — Z515 Encounter for palliative care: Secondary | ICD-10-CM | POA: Diagnosis not present

## 2017-02-20 DIAGNOSIS — E860 Dehydration: Secondary | ICD-10-CM | POA: Diagnosis not present

## 2017-02-20 DIAGNOSIS — R509 Fever, unspecified: Secondary | ICD-10-CM

## 2017-02-20 DIAGNOSIS — L03115 Cellulitis of right lower limb: Secondary | ICD-10-CM | POA: Diagnosis present

## 2017-02-20 DIAGNOSIS — Y846 Urinary catheterization as the cause of abnormal reaction of the patient, or of later complication, without mention of misadventure at the time of the procedure: Secondary | ICD-10-CM | POA: Diagnosis present

## 2017-02-20 DIAGNOSIS — Z1612 Extended spectrum beta lactamase (ESBL) resistance: Secondary | ICD-10-CM | POA: Diagnosis present

## 2017-02-20 DIAGNOSIS — N179 Acute kidney failure, unspecified: Secondary | ICD-10-CM | POA: Diagnosis present

## 2017-02-20 DIAGNOSIS — Z66 Do not resuscitate: Secondary | ICD-10-CM | POA: Diagnosis present

## 2017-02-20 DIAGNOSIS — I129 Hypertensive chronic kidney disease with stage 1 through stage 4 chronic kidney disease, or unspecified chronic kidney disease: Secondary | ICD-10-CM | POA: Diagnosis present

## 2017-02-20 DIAGNOSIS — F28 Other psychotic disorder not due to a substance or known physiological condition: Secondary | ICD-10-CM

## 2017-02-20 DIAGNOSIS — F0281 Dementia in other diseases classified elsewhere with behavioral disturbance: Secondary | ICD-10-CM | POA: Diagnosis not present

## 2017-02-20 DIAGNOSIS — Z79899 Other long term (current) drug therapy: Secondary | ICD-10-CM

## 2017-02-20 DIAGNOSIS — I10 Essential (primary) hypertension: Secondary | ICD-10-CM | POA: Diagnosis present

## 2017-02-20 DIAGNOSIS — E876 Hypokalemia: Secondary | ICD-10-CM | POA: Diagnosis not present

## 2017-02-20 DIAGNOSIS — N170 Acute kidney failure with tubular necrosis: Secondary | ICD-10-CM | POA: Diagnosis not present

## 2017-02-20 DIAGNOSIS — R0603 Acute respiratory distress: Secondary | ICD-10-CM | POA: Diagnosis not present

## 2017-02-20 DIAGNOSIS — B9689 Other specified bacterial agents as the cause of diseases classified elsewhere: Secondary | ICD-10-CM | POA: Diagnosis present

## 2017-02-20 DIAGNOSIS — G9340 Encephalopathy, unspecified: Secondary | ICD-10-CM

## 2017-02-20 DIAGNOSIS — K921 Melena: Secondary | ICD-10-CM | POA: Diagnosis not present

## 2017-02-20 DIAGNOSIS — N390 Urinary tract infection, site not specified: Secondary | ICD-10-CM | POA: Diagnosis present

## 2017-02-20 DIAGNOSIS — E43 Unspecified severe protein-calorie malnutrition: Secondary | ICD-10-CM | POA: Diagnosis not present

## 2017-02-20 DIAGNOSIS — Z22322 Carrier or suspected carrier of Methicillin resistant Staphylococcus aureus: Secondary | ICD-10-CM | POA: Diagnosis not present

## 2017-02-20 DIAGNOSIS — L89629 Pressure ulcer of left heel, unspecified stage: Secondary | ICD-10-CM | POA: Diagnosis present

## 2017-02-20 DIAGNOSIS — Z885 Allergy status to narcotic agent status: Secondary | ICD-10-CM

## 2017-02-20 DIAGNOSIS — N1330 Unspecified hydronephrosis: Secondary | ICD-10-CM

## 2017-02-20 LAB — I-STAT CHEM 8, ED
BUN: 89 mg/dL — AB (ref 6–20)
CALCIUM ION: 1.02 mmol/L — AB (ref 1.15–1.40)
CHLORIDE: 111 mmol/L (ref 101–111)
CREATININE: 5 mg/dL — AB (ref 0.61–1.24)
Glucose, Bld: 143 mg/dL — ABNORMAL HIGH (ref 65–99)
HEMATOCRIT: 54 % — AB (ref 39.0–52.0)
Hemoglobin: 18.4 g/dL — ABNORMAL HIGH (ref 13.0–17.0)
Potassium: 4.5 mmol/L (ref 3.5–5.1)
Sodium: 148 mmol/L — ABNORMAL HIGH (ref 135–145)
TCO2: 22 mmol/L (ref 0–100)

## 2017-02-20 LAB — MAGNESIUM: MAGNESIUM: 2.2 mg/dL (ref 1.7–2.4)

## 2017-02-20 LAB — LACTIC ACID, PLASMA
Lactic Acid, Venous: 1.3 mmol/L (ref 0.5–1.9)
Lactic Acid, Venous: 1.8 mmol/L (ref 0.5–1.9)

## 2017-02-20 LAB — URINALYSIS, COMPLETE (UACMP) WITH MICROSCOPIC
BILIRUBIN URINE: NEGATIVE
Glucose, UA: NEGATIVE mg/dL
Ketones, ur: NEGATIVE mg/dL
Nitrite: NEGATIVE
PH: 6 (ref 5.0–8.0)
Protein, ur: 100 mg/dL — AB
SPECIFIC GRAVITY, URINE: 1.01 (ref 1.005–1.030)
Squamous Epithelial / LPF: NONE SEEN

## 2017-02-20 LAB — COMPREHENSIVE METABOLIC PANEL
ALBUMIN: 2.5 g/dL — AB (ref 3.5–5.0)
ALT: 24 U/L (ref 17–63)
AST: 45 U/L — AB (ref 15–41)
Alkaline Phosphatase: 32 U/L — ABNORMAL LOW (ref 38–126)
Anion gap: 15 (ref 5–15)
BUN: 100 mg/dL — AB (ref 6–20)
CHLORIDE: 112 mmol/L — AB (ref 101–111)
CO2: 20 mmol/L — ABNORMAL LOW (ref 22–32)
CREATININE: 4.97 mg/dL — AB (ref 0.61–1.24)
Calcium: 8.5 mg/dL — ABNORMAL LOW (ref 8.9–10.3)
GFR calc Af Amer: 11 mL/min — ABNORMAL LOW (ref >60)
GFR calc non Af Amer: 10 mL/min — ABNORMAL LOW (ref >60)
GLUCOSE: 146 mg/dL — AB (ref 65–99)
POTASSIUM: 4.6 mmol/L (ref 3.5–5.1)
Sodium: 147 mmol/L — ABNORMAL HIGH (ref 135–145)
TOTAL PROTEIN: 6.1 g/dL — AB (ref 6.5–8.1)
Total Bilirubin: 0.8 mg/dL (ref 0.3–1.2)

## 2017-02-20 LAB — PROTIME-INR
INR: 1.39
PROTHROMBIN TIME: 17.2 s — AB (ref 11.4–15.2)

## 2017-02-20 LAB — CBC WITH DIFFERENTIAL/PLATELET
BASOS ABS: 0 10*3/uL (ref 0.0–0.1)
Basophils Relative: 0 %
EOS ABS: 0 10*3/uL (ref 0.0–0.7)
Eosinophils Relative: 0 %
HCT: 25.6 % — ABNORMAL LOW (ref 39.0–52.0)
Hemoglobin: 8 g/dL — ABNORMAL LOW (ref 13.0–17.0)
Lymphocytes Relative: 3 %
Lymphs Abs: 0.8 10*3/uL (ref 0.7–4.0)
MCH: 23.5 pg — ABNORMAL LOW (ref 26.0–34.0)
MCHC: 31.3 g/dL (ref 30.0–36.0)
MCV: 75.1 fL — ABNORMAL LOW (ref 78.0–100.0)
Monocytes Absolute: 0.8 10*3/uL (ref 0.1–1.0)
Monocytes Relative: 3 %
NEUTROS PCT: 94 %
Neutro Abs: 24.4 10*3/uL — ABNORMAL HIGH (ref 1.7–7.7)
PLATELETS: 340 10*3/uL (ref 150–400)
RBC: 3.41 MIL/uL — AB (ref 4.22–5.81)
RDW: 16.1 % — ABNORMAL HIGH (ref 11.5–15.5)
WBC: 26 10*3/uL — AB (ref 4.0–10.5)

## 2017-02-20 LAB — MRSA PCR SCREENING: MRSA by PCR: POSITIVE — AB

## 2017-02-20 LAB — I-STAT CG4 LACTIC ACID, ED: Lactic Acid, Venous: 2.04 mmol/L (ref 0.5–1.9)

## 2017-02-20 LAB — TROPONIN I
TROPONIN I: 0.19 ng/mL — AB (ref <0.03)
TROPONIN I: 0.1 ng/mL — AB (ref <0.03)

## 2017-02-20 MED ORDER — ONDANSETRON HCL 4 MG PO TABS: 4.0000 mg | ORAL_TABLET | Freq: Four times a day (QID) | ORAL | Status: DC | PRN

## 2017-02-20 MED ORDER — ACETAMINOPHEN 650 MG RE SUPP
650.0000 mg | Freq: Once | RECTAL | Status: AC
  Administered 2017-02-20: 650 mg via RECTAL
  Filled 2017-02-20: qty 1

## 2017-02-20 MED ORDER — PIPERACILLIN-TAZOBACTAM IN DEX 2-0.25 GM/50ML IV SOLN
2.2500 g | Freq: Three times a day (TID) | INTRAVENOUS | Status: DC
  Administered 2017-02-20 – 2017-02-21 (×2): 2.25 g via INTRAVENOUS
  Filled 2017-02-20 (×4): qty 50

## 2017-02-20 MED ORDER — CHLORHEXIDINE GLUCONATE 0.12 % MT SOLN
15.0000 mL | Freq: Two times a day (BID) | OROMUCOSAL | Status: DC
  Administered 2017-02-20 – 2017-02-27 (×13): 15 mL via OROMUCOSAL
  Filled 2017-02-20 (×10): qty 15

## 2017-02-20 MED ORDER — PANTOPRAZOLE SODIUM 40 MG PO TBEC: 40.0000 mg | DELAYED_RELEASE_TABLET | Freq: Every day | ORAL | Status: DC

## 2017-02-20 MED ORDER — ENSURE ENLIVE PO LIQD: 237.0000 mL | Freq: Two times a day (BID) | ORAL | Status: DC

## 2017-02-20 MED ORDER — ACETAMINOPHEN 325 MG PO TABS: 650.0000 mg | ORAL_TABLET | Freq: Four times a day (QID) | ORAL | Status: DC | PRN

## 2017-02-20 MED ORDER — TAMSULOSIN HCL 0.4 MG PO CAPS: 0.4000 mg | ORAL_CAPSULE | Freq: Every day | ORAL | Status: DC

## 2017-02-20 MED ORDER — HEPARIN SODIUM (PORCINE) 5000 UNIT/ML IJ SOLN
5000.0000 [IU] | Freq: Three times a day (TID) | INTRAMUSCULAR | Status: DC
  Administered 2017-02-20 – 2017-02-25 (×16): 5000 [IU] via SUBCUTANEOUS
  Filled 2017-02-20 (×16): qty 1

## 2017-02-20 MED ORDER — SODIUM CHLORIDE 0.9 % IV BOLUS (SEPSIS)
1000.0000 mL | Freq: Once | INTRAVENOUS | Status: AC
  Administered 2017-02-20: 1000 mL via INTRAVENOUS

## 2017-02-20 MED ORDER — RISPERIDONE 0.5 MG PO TABS
0.5000 mg | ORAL_TABLET | Freq: Two times a day (BID) | ORAL | Status: DC
  Filled 2017-02-20 (×4): qty 1
  Filled 2017-02-20: qty 2

## 2017-02-20 MED ORDER — ASPIRIN EC 81 MG PO TBEC: 81.0000 mg | DELAYED_RELEASE_TABLET | Freq: Every day | ORAL | Status: DC

## 2017-02-20 MED ORDER — VANCOMYCIN HCL IN DEXTROSE 1-5 GM/200ML-% IV SOLN
1000.0000 mg | Freq: Once | INTRAVENOUS | Status: AC
  Administered 2017-02-20: 1000 mg via INTRAVENOUS
  Filled 2017-02-20: qty 200

## 2017-02-20 MED ORDER — ORAL CARE MOUTH RINSE
15.0000 mL | Freq: Two times a day (BID) | OROMUCOSAL | Status: DC
  Administered 2017-02-20 – 2017-02-27 (×10): 15 mL via OROMUCOSAL

## 2017-02-20 MED ORDER — MUPIROCIN 2 % EX OINT
1.0000 "application " | TOPICAL_OINTMENT | Freq: Two times a day (BID) | CUTANEOUS | Status: AC
  Administered 2017-02-20 – 2017-02-25 (×10): 1 via NASAL
  Filled 2017-02-20 (×2): qty 22

## 2017-02-20 MED ORDER — ENOXAPARIN SODIUM 40 MG/0.4ML ~~LOC~~ SOLN: 40.0000 mg | SUBCUTANEOUS | Status: DC

## 2017-02-20 MED ORDER — PIPERACILLIN-TAZOBACTAM 3.375 G IVPB 30 MIN
3.3750 g | Freq: Once | INTRAVENOUS | Status: AC
  Administered 2017-02-20: 3.375 g via INTRAVENOUS
  Filled 2017-02-20: qty 50

## 2017-02-20 MED ORDER — ATENOLOL 12.5 MG HALF TABLET
12.5000 mg | ORAL_TABLET | Freq: Every day | ORAL | Status: DC
  Filled 2017-02-20: qty 1

## 2017-02-20 MED ORDER — ACETAMINOPHEN 650 MG RE SUPP
650.0000 mg | Freq: Four times a day (QID) | RECTAL | Status: DC | PRN
  Administered 2017-02-22 – 2017-02-24 (×2): 650 mg via RECTAL
  Filled 2017-02-20 (×2): qty 1

## 2017-02-20 MED ORDER — LEVALBUTEROL HCL 0.63 MG/3ML IN NEBU: 0.6300 mg | INHALATION_SOLUTION | Freq: Four times a day (QID) | RESPIRATORY_TRACT | Status: DC | PRN

## 2017-02-20 MED ORDER — ONDANSETRON HCL 4 MG/2ML IJ SOLN: 4.0000 mg | Freq: Four times a day (QID) | INTRAMUSCULAR | Status: DC | PRN

## 2017-02-20 MED ORDER — CHLORHEXIDINE GLUCONATE CLOTH 2 % EX PADS
6.0000 | MEDICATED_PAD | Freq: Every day | CUTANEOUS | Status: AC
  Administered 2017-02-21 – 2017-02-25 (×5): 6 via TOPICAL

## 2017-02-20 NOTE — ED Provider Notes (Addendum)
[facility]-EMERGENCY DEPT Provider Note   CSN: [id number] Arrival date & time: 02/20/17  1321     History   Chief Complaint Chief Complaint  Patient presents with  . Altered Mental Status    HPI [place] is a 83 y.o. male.  HPI   83 yo M with PMHx of dementia, HTN who p/w AMS. According to nurse Raquel at skilled nursing facility via telephone as well as report from EMS, patient has had increasing confusion over the last 2 days. He is normally alert and interactive, though he does have difficulty with orientation due to dementia. Yesterday, he was noted to be less responsive and was noted to have elevated white blood cell count on labs. Blood cultures were drawn and he was given IM Rocephin. Over the last 24 hours, however, he has had progressively worsening mental status and is less and less responsive. He subsequent presents to the ED for evaluation.  Level 5 caveat invoked as remainder of history, ROS, and physical exam limited due to patient's AMS, dementia.     Past Medical History:  Diagnosis Date  . Anemia   . Dementia    with behavioral disturbance  . Hydronephrosis   . Hypertension   . SunDown syndrome     Patient Active Problem List   Diagnosis Date Noted  . Pressure injury of skin 01/30/2017  . Bladder outlet obstruction 01/30/2017  . Bilateral hydronephrosis 01/30/2017  . Cellulitis of right foot 01/29/2017  . Normocytic anemia 01/29/2017  . AKI (acute kidney injury) (HCC) 01/29/2017  . Open wound of left heel 01/29/2017  . HTN (hypertension), benign 11/25/2016  . Dementia with behavioral disturbance 11/25/2016  . Pedal edema 11/25/2016    Past Surgical History:  Procedure Laterality Date  . BYPASS GRAFT Right    artery bypass  . TIBIA FRACTURE SURGERY     R shin       Home Medications    Prior to Admission medications   Medication Sig Start Date End Date Taking? Authorizing Provider  Amino Acids-Protein Hydrolys (FEEDING SUPPLEMENT,  PRO-STAT SUGAR FREE 64,) LIQD Take 30 mLs by mouth 2 (two) times daily. 02/02/17  Yes Elease Etienne, MD  amLODipine (NORVASC) 5 MG tablet Take 1 tablet (5 mg total) by mouth daily. 02/03/17  Yes Elease Etienne, MD  aspirin EC 81 MG tablet Take 81 mg by mouth daily.   Yes Historical Provider, MD  atenolol (TENORMIN) 25 MG tablet Take 1 tablet (25 mg total) by mouth daily. 11/25/16  Yes Dawn Marland Mcalpine, MD  bisacodyl (DULCOLAX) 5 MG EC tablet Take 5 mg by mouth daily as needed for moderate constipation.   Yes Historical Provider, MD  cefTRIAXone (ROCEPHIN) 1 g injection Inject 1 g into the muscle daily. Inject 1 gram intramuscular one time a day for infection until IV Rocephin arrives  Started 03/01   Yes Historical Provider, MD  LORazepam (ATIVAN) 2 MG/ML injection Inject 1 mg into the muscle every 12 (twelve) hours as needed (for agitation). Inject 0.5 ml intramuscular every 12 hours as needed    Yes Historical Provider, MD  Multiple Vitamin (MULTIVITAMIN WITH MINERALS) TABS tablet Take 1 tablet by mouth daily. 02/03/17  Yes Elease Etienne, MD  Nutritional Supplements (NUTRITIONAL DRINK) LIQD Take 90 mLs by mouth daily. *House Supplement 2.0*   Yes Historical Provider, MD  risperiDONE (RISPERDAL) 0.5 MG tablet Take 0.5 mg by mouth 2 (two) times daily.   Yes Historical Provider, MD  tamsulosin [facility])  0.4 MG CAPS capsule Take 1 capsule (0.4 mg total) by mouth daily. 02/03/17  Yes Elease Etienne, MD    Family History Family History  Problem Relation Age of Onset  . Cancer Mother   . Cancer Father     prostate    Social History Social History  Substance Use Topics  . Smoking status: Never Smoker  . Smokeless tobacco: Never Used  . 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 He appears lethargic. He appears cachectic. He has a sickly appearance. He appears ill. He appears distressed.  HENT:  Head: Normocephalic and atraumatic.  Markedly dry MM  Eyes: Conjunctivae are normal.  Neck: Neck supple.  Cardiovascular: Regular rhythm and normal heart sounds.  Tachycardia present.  Exam reveals no friction rub.   No murmur heard. Pulmonary/Chest: Effort normal. No respiratory distress. He has no wheezes. He has rhonchi in the right lower field, the left middle field and the left lower field. He has no rales.  Abdominal: He exhibits no distension. There is tenderness (Marked suprapubic TTP with palpable bladder enlargement).  Musculoskeletal: He exhibits no edema.  Neurological: He is oriented to person, place, and time. He appears lethargic. He exhibits normal muscle tone.  Skin: Skin is warm. Capillary refill takes less than 2 seconds.  Psychiatric: He has a normal mood and affect.  Nursing note and vitals reviewed.    ED Treatments / Results  Labs (all labs ordered are listed, but only abnormal results are displayed) Labs Reviewed  COMPREHENSIVE METABOLIC PANEL - Abnormal; Notable for the following:       Result Value   Sodium 147 (*)    Chloride 112 (*)    CO2 20 (*)    Glucose, Bld 146 (*)    BUN 100 (*)    Creatinine, Ser 4.97 (*)    Calcium 8.5 (*)    Total Protein 6.1 (*)    Albumin 2.5 (*)    AST 45 (*)    Alkaline Phosphatase 32 (*)    GFR calc non Af Amer 10 (*)    GFR calc Af Amer 11 (*)    All other components within normal limits  CBC WITH DIFFERENTIAL/PLATELET - Abnormal; Notable for the following:    WBC 26.0 (*)    RBC 3.41 (*)    Hemoglobin 8.0 (*)    HCT 25.6 (*)    MCV 75.1 (*)    MCH 23.5 (*)    RDW  16.1 (*)    Neutro Abs 24.4 (*)    All other components within normal limits  URINALYSIS, COMPLETE (UACMP) WITH MICROSCOPIC - Abnormal; Notable for the following:    APPearance TURBID (*)    Hgb urine dipstick LARGE (*)    Protein, ur 100 (*)    Leukocytes, UA LARGE (*)    Bacteria, UA MANY (*)    All other components within normal limits  I-STAT CG4 LACTIC ACID, ED -  Abnormal; Notable for the following:    Lactic Acid, Venous 2.04 (*)    All other components within normal limits  I-STAT CHEM 8, ED - Abnormal; Notable for the following:    Sodium 148 (*)    BUN 89 (*)    Creatinine, Ser 5.00 (*)    Glucose, Bld 143 (*)    Calcium, Ion 1.02 (*)    Hemoglobin 18.4 (*)    HCT 54.0 (*)    All other components within normal limits  CULTURE, BLOOD (ROUTINE X 2)  CULTURE, BLOOD (ROUTINE X 2)  CBC  I-STAT CG4 LACTIC ACID, ED    EKG  EKG Interpretation  Date/Time:  Thursday February 20 2017 13:31:38 EST Ventricular Rate:  109 PR Interval:    QRS Duration: 85 QT Interval:  311 QTC Calculation: 419 R Axis:   19 Text Interpretation:  Sinus tachycardia Atrial premature complexes Borderline low voltage, extremity leads No significant change since last tracing Confirmed by Elease Swarm MD, Milton Streicher [phone]) on 02/20/2017 3:09:28 PM       Radiology Dg Chest Port 1 View  Result Date: 02/20/2017 CLINICAL DATA:  Cough. Altered mental status for 1 day. Leukocytosis. EXAM: PORTABLE CHEST 1 VIEW COMPARISON:  None. FINDINGS: Hazy opacity of the right more than left bases, primarily concerning for pneumonia in this setting. No edema, effusion, or pneumothorax. Normal heart size and mediastinal contours. IMPRESSION: Opacities at the right greater than left bases primarily concerning for pneumonia or aspiration in this setting. Electronically Signed   By: Marnee Spring M.D.   On: 02/20/2017 14:15    Procedures .Critical Care Performed by: Shaune Pollack Authorized by: Shaune Pollack   Critical care  provider statement:    Critical care time (minutes):  45   Critical care time was exclusive of:  Separately billable procedures and treating other patients   Critical care was necessary to treat or prevent imminent or life-threatening deterioration of the following conditions:  Circulatory failure, renal failure, respiratory failure, sepsis, dehydration and metabolic crisis   Critical care was time spent personally by me on the following activities:  Development of treatment plan with patient or surrogate, discussions with consultants, evaluation of patient's response to treatment, examination of patient, obtaining history from patient or surrogate, ordering and performing treatments and interventions, ordering and review of laboratory studies, ordering and review of radiographic studies, pulse oximetry, review of old charts and re-evaluation of patient's condition   I assumed direction of critical care for this patient from another provider in my specialty: no     (including critical care time)  Medications Ordered in ED Medications  sodium chloride 0.9 % bolus 1,000 mL (0 mLs Intravenous Stopped 02/20/17 1555)    And  sodium chloride 0.9 % bolus 1,000 mL (0 mLs Intravenous Stopped 02/20/17 1555)    And  sodium chloride 0.9 % bolus 1,000 mL (not administered)  acetaminophen (TYLENOL) suppository 650 mg (not administered)  piperacillin-tazobactam (ZOSYN) IVPB 2.25 g (not administered)  piperacillin-tazobactam (ZOSYN) IVPB 3.375 g (0 g Intravenous Stopped 02/20/17 1554)  vancomycin (VANCOCIN) IVPB 1000 mg/200 mL premix (0 mg Intravenous Stopped 02/20/17 1555)     Initial Impression / Assessment and Plan / ED Course  I have reviewed the triage vital signs and the nursing notes.  Pertinent labs & imaging results that were available during my care of the patient were reviewed by me and considered in my medical decision making (see chart for details).    83 yo M with PMHx  as above here with AMS,  confusion, decreased LOC in setting of recently starting IM Rocephin at SNF yesterday. On arrival, pt tachycardic, confused, altered. Noted to have large bladder on exam with bladder scan >1L - foley placed with drainage of purulent, yellow urine. Remainder of exam as above.  Suspect severe sepsis 2/2 UTI, c/b urinary obstruction and post-renal AKI. CODE SEPSIS initiated, Vanc/Zosyn given, and IVF given with improvement in mentation. CXR also c/f aspiration PNA - ABX should cover this. O2 stable on [place]. CBC with marked leukocytosis. Remainder of labs as above.  Discussed POC with son/HCPOA, [place]. Pt would desire short term intubation if needed. Will admit to SDU. On repeat assessment, perfusion improved, pt responsive to voice, and protecting airway with intact gag.  Final Clinical Impressions(s) / ED Diagnoses   Final diagnoses:  Severe sepsis (HCC)  AKI (acute kidney injury) (HCC)  Aspiration pneumonia of both lower lobes, unspecified aspiration pneumonia type (HCC)  Urinary tract infection associated with indwelling urethral catheter, initial encounter (HCC)      Shaune Pollackameron Amra Shukla, MD 02/20/17 [id number]    Shaune Pollackameron Garlin Batdorf, MD 02/20/17 [phone]

## 2017-02-20 NOTE — ED Triage Notes (Signed)
Pt with altered mental status x 1 day. NH reports elevated white count and had been given Rocephin IM, but unsure what patient is being treated for. Pt with dementia but NH reports altered mental status and only responds to painful stimuli.

## 2017-02-20 NOTE — Progress Notes (Signed)
DATE:  February 20, 2017  Location:   [facility] Room Number: [place] of Service: SNF (31)   Extended Emergency Contact Information Primary Emergency Contact: Cross,Jonathan Address: [address]          [place], [place] [address] [name] of [place] Phone: [phone] Mobile Phone: [phone] Relation: Son Secondary Emergency Contact: Orland Mustard [place] of [place] Phone: [phone] Mobile Phone: [phone] Relation: Friend  Advanced Directive information Does Patient Have a Medical Advance Directive?: Yes, Type of Advance Directive: Out of facility DNR (pink MOST or yellow form), Pre-existing out of facility DNR order (yellow form or pink MOST form): Pink MOST form placed in chart (order not valid for inpatient use)  Chief Complaint  Patient presents with  . Acute Visit    Respiratory Distress    HPI:  83 yo male long term resident seen today for worsening respiratory distress. Nursing noted increasing SOB. Tmax 100.68F. Pt is a poor historian due to dementia with behavioral disturbance. Hx obtained from chart. He was started on IV rocephin and IVF on yesterday for FUO. Labs are still pending. Pt has a foley cath for urinary retention  Dementia - he takes no meds for cognition but he does take risperdal BID for behavioral disturbance. He gets nutritional supplements per facility protocol  HTN - BP stable on norvasc, atenolol. He takes ASA daily  PAD - s/p RLE bypass sx several yrs ago. Takes ASA daily  Hx anemia - Hgb 8.9. He takes MVI daily  Past Medical History:  Diagnosis Date  . Anemia   . Dementia    with behavioral disturbance  . Hydronephrosis   . Hypertension   . SunDown syndrome     Past Surgical History:  Procedure Laterality Date  . BYPASS GRAFT Right    artery bypass  . TIBIA FRACTURE SURGERY     R shin    Patient Care Team: Maren Reamer, MD as PCP - General (Internal Medicine)  Social  History   Social History  . Marital status: Widowed    Spouse name: N/A  . Number of children: 2  . Years of education: 12   Occupational History  .      career Social research officer, government   Social History Main Topics  . Smoking status: Never Smoker  . Smokeless tobacco: Never Used  . 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 Drug use: No  . Sexual activity: Not on file   Other Topics Concern  . Not on file   Social History Narrative   Son lives with patient sometimes   Caffeine- none     reports that he has never smoked. He has never used smokeless tobacco. He reports that he does not drink alcohol or use drugs.  Family History  Problem Relation Age of Onset  . Cancer Mother   . Cancer Father     prostate   Family Status  Relation Status  . Mother Deceased  . Father Deceased    Immunization History  Administered Date(s) Administered  . Influenza,inj,Quad PF,36+ Mos 11/25/2016  . PPD Test 02/04/2017    Allergies  Allergen Reactions  . Codeine Anxiety  . Hydroxyzine Other (See Comments)    Makes things worse  . Seroquel [Quetiapine] Other (See Comments)    Makes things worse    Medications: Patient's Medications  New Prescriptions   No medications on file  Previous Medications  AMINO ACIDS-PROTEIN HYDROLYS (FEEDING SUPPLEMENT, PRO-STAT SUGAR FREE 64,) LIQD    Take 30 mLs by mouth 2 (two) times daily.   AMLODIPINE (NORVASC) 5 MG TABLET    Take 1 tablet (5 mg total) by mouth daily.   ASPIRIN EC 81 MG TABLET    Take 81 mg by mouth daily.   ATENOLOL (TENORMIN) 25 MG TABLET    Take 1 tablet (25 mg total) by mouth daily.   BISACODYL (DULCOLAX) 5 MG EC TABLET    Take 5 mg by mouth daily as needed for moderate constipation.   CEFTRIAXONE (ROCEPHIN) 1 G INJECTION    Inject 1 gram intramuscular one time a day for infection until IV Rocephin arrives   LORAZEPAM (ATIVAN) 2 MG/ML INJECTION    Inject 0.5 ml intramuscular every 12 hours as needed   MULTIPLE VITAMIN  (MULTIVITAMIN WITH MINERALS) TABS TABLET    Take 1 tablet by mouth daily.   RISPERIDONE (RISPERDAL) 0.5 MG TABLET    Take 0.5 mg by mouth 2 (two) times daily.   TAMSULOSIN (FLOMAX) 0.4 MG CAPS CAPSULE    Take 1 capsule (0.4 mg total) by mouth daily.   UNABLE TO FIND    House 2.0 one time a day 90 cc  Modified Medications   No medications on file  Discontinued Medications   No medications on file    Review of Systems  Unable to perform ROS: Dementia    Vitals:   02/20/17 1410  BP: (!) 150/82  Pulse: (!) 125  Resp: (!) 46  Temp: 100.2 F (37.9 C)  SpO2: 94%  Weight: 159 lb (72.1 kg)  Height: 5' 10"  (1.778 m)   Body mass index is 22.81 kg/m.  Physical Exam  Constitutional: He appears well-developed. He appears lethargic. He has a sickly appearance.  He is mouth breathing, frail appearing with increased WOB  HENT:  Mouth/Throat: Oropharynx is clear and moist.  MM dry; no oral thrush  Eyes: Pupils are equal, round, and reactive to light. No scleral icterus.  open  Neck: Neck supple. No JVD present. Carotid bruit is not present. No tracheal deviation present. No thyromegaly present.  Cardiovascular: Regular rhythm and intact distal pulses.  Tachycardia present.  Exam reveals no gallop and no friction rub.   Murmur heard.  Systolic murmur is present with a grade of 1/6  Right arm with intact peripheral IV and 1/2 NS running; pulse is bounding; trace LE edema b/l. No calf TTP  Pulmonary/Chest: Accessory muscle usage present. He is in respiratory distress. He has decreased breath sounds (right base). He has no wheezes. He has no rhonchi. He has no rales. He exhibits no tenderness.  Abdominal: Soft. Bowel sounds are normal. He exhibits no distension, no abdominal bruit, no pulsatile midline mass and no mass. There is no hepatomegaly. There is no tenderness. There is no rebound and no guarding.  Genitourinary:  Genitourinary Comments: Foley DTG dk yellow urine  Musculoskeletal: He  exhibits edema and deformity.  Lymphadenopathy:    He has no cervical adenopathy.  Neurological: He appears lethargic.  Skin: Skin is warm and dry. Rash noted.  (+) tenting  Psychiatric: He has a normal mood and affect. His behavior is normal.     Labs reviewed: Admission on 02/20/2017  Component Date Value Ref Range Status  . Lactic Acid, Venous 02/20/2017 2.04* 0.5 - 1.9 mmol/L Final  . Comment 02/20/2017 NOTIFIED PHYSICIAN   Final  . Sodium 02/20/2017 148* 135 - 145 mmol/L Final  .  Potassium 02/20/2017 4.5  3.5 - 5.1 mmol/L Final  . Chloride 02/20/2017 111  101 - 111 mmol/L Final  . BUN 02/20/2017 89* 6 - 20 mg/dL Final  . Creatinine, Ser 02/20/2017 5.00* 0.61 - 1.24 mg/dL Final  . Glucose, Bld 02/20/2017 143* 65 - 99 mg/dL Final  . Calcium, Ion 02/20/2017 1.02* 1.15 - 1.40 mmol/L Final  . TCO2 02/20/2017 22  0 - 100 mmol/L Final  . Hemoglobin 02/20/2017 18.4* 13.0 - 17.0 g/dL Final  . HCT 02/20/2017 54.0* 39.0 - 52.0 % Final  Admission on 01/28/2017, Discharged on 02/02/2017  Component Date Value Ref Range Status  . Lactic Acid, Venous 01/28/2017 0.82  0.5 - 1.9 mmol/L Final  . Sodium 01/28/2017 145  135 - 145 mmol/L Final  . Potassium 01/28/2017 3.5  3.5 - 5.1 mmol/L Final  . Chloride 01/28/2017 109  101 - 111 mmol/L Final  . CO2 01/28/2017 29  22 - 32 mmol/L Final  . Glucose, Bld 01/28/2017 95  65 - 99 mg/dL Final  . BUN 01/28/2017 16  6 - 20 mg/dL Final  . Creatinine, Ser 01/28/2017 1.65* 0.61 - 1.24 mg/dL Final  . Calcium 01/28/2017 8.7* 8.9 - 10.3 mg/dL Final  . Total Protein 01/28/2017 6.2* 6.5 - 8.1 g/dL Final  . Albumin 01/28/2017 3.0* 3.5 - 5.0 g/dL Final  . AST 01/28/2017 21  15 - 41 U/L Final  . ALT 01/28/2017 11* 17 - 63 U/L Final  . Alkaline Phosphatase 01/28/2017 45  38 - 126 U/L Final  . Total Bilirubin 01/28/2017 0.5  0.3 - 1.2 mg/dL Final  . GFR calc non Af Amer 01/28/2017 37* >60 mL/min Final  . GFR calc Af Amer 01/28/2017 43* >60 mL/min Final    Comment: (NOTE) The eGFR has been calculated using the CKD EPI equation. This calculation has not been validated in all clinical situations. eGFR's persistently <60 mL/min signify possible Chronic Kidney Disease.   . Anion gap 01/28/2017 7  5 - 15 Final  . WBC 01/28/2017 9.4  4.0 - 10.5 K/uL Final  . RBC 01/28/2017 3.70* 4.22 - 5.81 MIL/uL Final  . Hemoglobin 01/28/2017 8.8* 13.0 - 17.0 g/dL Final  . HCT 01/28/2017 29.5* 39.0 - 52.0 % Final  . MCV 01/28/2017 79.7  78.0 - 100.0 fL Final  . MCH 01/28/2017 23.8* 26.0 - 34.0 pg Final  . MCHC 01/28/2017 29.8* 30.0 - 36.0 g/dL Final  . RDW 01/28/2017 14.8  11.5 - 15.5 % Final  . Platelets 01/28/2017 378  150 - 400 K/uL Final  . Neutrophils Relative % 01/28/2017 73  % Final  . Neutro Abs 01/28/2017 6.9  1.7 - 7.7 K/uL Final  . Lymphocytes Relative 01/28/2017 17  % Final  . Lymphs Abs 01/28/2017 1.6  0.7 - 4.0 K/uL Final  . Monocytes Relative 01/28/2017 8  % Final  . Monocytes Absolute 01/28/2017 0.7  0.1 - 1.0 K/uL Final  . Eosinophils Relative 01/28/2017 2  % Final  . Eosinophils Absolute 01/28/2017 0.2  0.0 - 0.7 K/uL Final  . Basophils Relative 01/28/2017 0  % Final  . Basophils Absolute 01/28/2017 0.0  0.0 - 0.1 K/uL Final  . Color, Urine 01/29/2017 YELLOW  YELLOW Final  . APPearance 01/29/2017 CLEAR  CLEAR Final  . Specific Gravity, Urine 01/29/2017 1.008  1.005 - 1.030 Final  . pH 01/29/2017 6.0  5.0 - 8.0 Final  . Glucose, UA 01/29/2017 NEGATIVE  NEGATIVE mg/dL Final  . Hgb urine dipstick 01/29/2017 SMALL*  NEGATIVE Final  . Bilirubin Urine 01/29/2017 NEGATIVE  NEGATIVE Final  . Ketones, ur 01/29/2017 NEGATIVE  NEGATIVE mg/dL Final  . Protein, ur 01/29/2017 NEGATIVE  NEGATIVE mg/dL Final  . Nitrite 01/29/2017 NEGATIVE  NEGATIVE Final  . Leukocytes, UA 01/29/2017 NEGATIVE  NEGATIVE Final  . RBC / HPF 01/29/2017 0-5  0 - 5 RBC/hpf Final  . WBC, UA 01/29/2017 0-5  0 - 5 WBC/hpf Final  . Bacteria, UA 01/29/2017 NONE SEEN  NONE SEEN  Final  . Squamous Epithelial / LPF 01/29/2017 NONE SEEN  NONE SEEN Final  . Lactic Acid, Venous 01/28/2017 1.15  0.5 - 1.9 mmol/L Final  . Specimen Description 01/29/2017 BLOOD LEFT FOREARM   Final  . Special Requests 01/29/2017 AEROBIC BOTTLE ONLY 5ML   Final  . Culture 01/29/2017 NO GROWTH 5 DAYS   Final  . Report Status 01/29/2017 02/03/2017 FINAL   Final  . Specimen Description 01/29/2017 BLOOD RIGHT WRIST   Final  . Special Requests 01/29/2017 IN PEDIATRIC BOTTLE 3ML   Final  . Culture 01/29/2017 NO GROWTH 5 DAYS   Final  . Report Status 01/29/2017 02/03/2017 FINAL   Final  . Fecal Occult Bld 01/29/2017 NEGATIVE  NEGATIVE Final  . Creatinine, Urine 01/29/2017 64.19  mg/dL Final  . Sodium, Ur 01/29/2017 27  mmol/L Final  . Sodium 01/29/2017 145  135 - 145 mmol/L Final  . Potassium 01/29/2017 3.8  3.5 - 5.1 mmol/L Final  . Chloride 01/29/2017 106  101 - 111 mmol/L Final  . CO2 01/29/2017 26  22 - 32 mmol/L Final  . Glucose, Bld 01/29/2017 92  65 - 99 mg/dL Final  . BUN 01/29/2017 17  6 - 20 mg/dL Final  . Creatinine, Ser 01/29/2017 1.71* 0.61 - 1.24 mg/dL Final  . Calcium 01/29/2017 8.2* 8.9 - 10.3 mg/dL Final  . GFR calc non Af Amer 01/29/2017 36* >60 mL/min Final  . GFR calc Af Amer 01/29/2017 41* >60 mL/min Final   Comment: (NOTE) The eGFR has been calculated using the CKD EPI equation. This calculation has not been validated in all clinical situations. eGFR's persistently <60 mL/min signify possible Chronic Kidney Disease.   . Anion gap 01/29/2017 13  5 - 15 Final  . WBC 01/29/2017 10.1  4.0 - 10.5 K/uL Final  . RBC 01/29/2017 3.25* 4.22 - 5.81 MIL/uL Final  . Hemoglobin 01/29/2017 7.7* 13.0 - 17.0 g/dL Final  . HCT 01/29/2017 25.9* 39.0 - 52.0 % Final  . MCV 01/29/2017 79.7  78.0 - 100.0 fL Final  . MCH 01/29/2017 23.7* 26.0 - 34.0 pg Final  . MCHC 01/29/2017 29.7* 30.0 - 36.0 g/dL Final  . RDW 01/29/2017 14.8  11.5 - 15.5 % Final  . Platelets 01/29/2017 318  150 - 400  K/uL Final  . Sed Rate 01/29/2017 55* 0 - 16 mm/hr Final  . CRP 01/29/2017 6.3* <1.0 mg/dL Final  . WBC 01/30/2017 12.6* 4.0 - 10.5 K/uL Final  . RBC 01/30/2017 3.72* 4.22 - 5.81 MIL/uL Final  . Hemoglobin 01/30/2017 8.8* 13.0 - 17.0 g/dL Final  . HCT 01/30/2017 29.6* 39.0 - 52.0 % Final  . MCV 01/30/2017 79.6  78.0 - 100.0 fL Final  . MCH 01/30/2017 23.7* 26.0 - 34.0 pg Final  . MCHC 01/30/2017 29.7* 30.0 - 36.0 g/dL Final  . RDW 01/30/2017 15.2  11.5 - 15.5 % Final  . Platelets 01/30/2017 404* 150 - 400 K/uL Final  . Sodium 01/30/2017 146* 135 - 145 mmol/L Final  .  Potassium 01/30/2017 3.3* 3.5 - 5.1 mmol/L Final  . Chloride 01/30/2017 107  101 - 111 mmol/L Final  . CO2 01/30/2017 23  22 - 32 mmol/L Final  . Glucose, Bld 01/30/2017 112* 65 - 99 mg/dL Final  . BUN 01/30/2017 20  6 - 20 mg/dL Final  . Creatinine, Ser 01/30/2017 2.28* 0.61 - 1.24 mg/dL Final  . Calcium 01/30/2017 8.7* 8.9 - 10.3 mg/dL Final  . GFR calc non Af Amer 01/30/2017 25* >60 mL/min Final  . GFR calc Af Amer 01/30/2017 29* >60 mL/min Final   Comment: (NOTE) The eGFR has been calculated using the CKD EPI equation. This calculation has not been validated in all clinical situations. eGFR's persistently <60 mL/min signify possible Chronic Kidney Disease.   . Anion gap 01/30/2017 16* 5 - 15 Final  . Sodium, Ur 01/30/2017 88  mmol/L Final  . Creatinine, Urine 01/30/2017 26.09  mg/dL Final  . Sodium 01/31/2017 144  135 - 145 mmol/L Final  . Potassium 01/31/2017 3.5  3.5 - 5.1 mmol/L Final  . Chloride 01/31/2017 106  101 - 111 mmol/L Final  . CO2 01/31/2017 24  22 - 32 mmol/L Final  . Glucose, Bld 01/31/2017 125* 65 - 99 mg/dL Final  . BUN 01/31/2017 20  6 - 20 mg/dL Final  . Creatinine, Ser 01/31/2017 1.94* 0.61 - 1.24 mg/dL Final  . Calcium 01/31/2017 8.8* 8.9 - 10.3 mg/dL Final  . GFR calc non Af Amer 01/31/2017 31* >60 mL/min Final  . GFR calc Af Amer 01/31/2017 36* >60 mL/min Final   Comment: (NOTE) The  eGFR has been calculated using the CKD EPI equation. This calculation has not been validated in all clinical situations. eGFR's persistently <60 mL/min signify possible Chronic Kidney Disease.   . Anion gap 01/31/2017 14  5 - 15 Final  . WBC 01/31/2017 12.3* 4.0 - 10.5 K/uL Final  . RBC 01/31/2017 3.72* 4.22 - 5.81 MIL/uL Final  . Hemoglobin 01/31/2017 8.9* 13.0 - 17.0 g/dL Final  . HCT 01/31/2017 29.4* 39.0 - 52.0 % Final  . MCV 01/31/2017 79.0  78.0 - 100.0 fL Final  . MCH 01/31/2017 23.9* 26.0 - 34.0 pg Final  . MCHC 01/31/2017 30.3  30.0 - 36.0 g/dL Final  . RDW 01/31/2017 15.3  11.5 - 15.5 % Final  . Platelets 01/31/2017 366  150 - 400 K/uL Final  . Color, Urine 01/30/2017 RED* YELLOW Final  . APPearance 01/30/2017 TURBID* CLEAR Final  . Specific Gravity, Urine 01/30/2017 TEST NOT REPORTED DUE TO COLOR INTERFERENCE OF URINE PIGMENT  1.005 - 1.030 Final  . pH 01/30/2017 TEST NOT REPORTED DUE TO COLOR INTERFERENCE OF URINE PIGMENT  5.0 - 8.0 Final  . Glucose, UA 01/30/2017 TEST NOT REPORTED DUE TO COLOR INTERFERENCE OF URINE PIGMENT* NEGATIVE mg/dL Final  . Hgb urine dipstick 01/30/2017 TEST NOT REPORTED DUE TO COLOR INTERFERENCE OF URINE PIGMENT* NEGATIVE Final  . Bilirubin Urine 01/30/2017 TEST NOT REPORTED DUE TO COLOR INTERFERENCE OF URINE PIGMENT* NEGATIVE Final  . Ketones, ur 01/30/2017 TEST NOT REPORTED DUE TO COLOR INTERFERENCE OF URINE PIGMENT* NEGATIVE mg/dL Final  . Protein, ur 01/30/2017 TEST NOT REPORTED DUE TO COLOR INTERFERENCE OF URINE PIGMENT* NEGATIVE mg/dL Final  . Nitrite 01/30/2017 TEST NOT REPORTED DUE TO COLOR INTERFERENCE OF URINE PIGMENT* NEGATIVE Final  . Leukocytes, UA 01/30/2017 TEST NOT REPORTED DUE TO COLOR INTERFERENCE OF URINE PIGMENT* NEGATIVE Final  . RBC / HPF 01/30/2017 TOO NUMEROUS TO COUNT  0 - 5 RBC/hpf  Final  . WBC, UA 01/30/2017 TOO NUMEROUS TO COUNT  0 - 5 WBC/hpf Final  . Bacteria, UA 01/30/2017 MANY* NONE SEEN Final  . Squamous Epithelial /  LPF 01/30/2017 NONE SEEN  NONE SEEN Final  . Color, Urine 01/31/2017 RED* YELLOW Final  . APPearance 01/31/2017 TURBID* CLEAR Final  . Specific Gravity, Urine 01/31/2017 TEST NOT REPORTED DUE TO COLOR INTERFERENCE OF URINE PIGMENT  1.005 - 1.030 Final  . pH 01/31/2017 TEST NOT REPORTED DUE TO COLOR INTERFERENCE OF URINE PIGMENT  5.0 - 8.0 Final  . Glucose, UA 01/31/2017 TEST NOT REPORTED DUE TO COLOR INTERFERENCE OF URINE PIGMENT* NEGATIVE mg/dL Final  . Hgb urine dipstick 01/31/2017 TEST NOT REPORTED DUE TO COLOR INTERFERENCE OF URINE PIGMENT* NEGATIVE Final  . Bilirubin Urine 01/31/2017 TEST NOT REPORTED DUE TO COLOR INTERFERENCE OF URINE PIGMENT* NEGATIVE Final  . Ketones, ur 01/31/2017 TEST NOT REPORTED DUE TO COLOR INTERFERENCE OF URINE PIGMENT* NEGATIVE mg/dL Final  . Protein, ur 01/31/2017 TEST NOT REPORTED DUE TO COLOR INTERFERENCE OF URINE PIGMENT* NEGATIVE mg/dL Final  . Nitrite 01/31/2017 TEST NOT REPORTED DUE TO COLOR INTERFERENCE OF URINE PIGMENT* NEGATIVE Final  . Leukocytes, UA 01/31/2017 TEST NOT REPORTED DUE TO COLOR INTERFERENCE OF URINE PIGMENT* NEGATIVE Final  . RBC / HPF 01/31/2017 TOO NUMEROUS TO COUNT  0 - 5 RBC/hpf Final  . WBC, UA 01/31/2017 TOO NUMEROUS TO COUNT  0 - 5 WBC/hpf Final  . Bacteria, UA 01/31/2017 FEW* NONE SEEN Final  . Squamous Epithelial / LPF 01/31/2017 NONE SEEN  NONE SEEN Final  . Mucous 01/31/2017 PRESENT   Final  . Specimen Description 01/31/2017 URINE, RANDOM   Final  . Special Requests 01/31/2017 NONE   Final  . Culture 01/31/2017 NO GROWTH   Final  . Report Status 01/31/2017 02/01/2017 FINAL   Final  . Sodium 02/01/2017 143  135 - 145 mmol/L Final  . Potassium 02/01/2017 2.9* 3.5 - 5.1 mmol/L Final  . Chloride 02/01/2017 106  101 - 111 mmol/L Final  . CO2 02/01/2017 28  22 - 32 mmol/L Final  . Glucose, Bld 02/01/2017 108* 65 - 99 mg/dL Final  . BUN 02/01/2017 12  6 - 20 mg/dL Final  . Creatinine, Ser 02/01/2017 1.34* 0.61 - 1.24 mg/dL Final    . Calcium 02/01/2017 8.5* 8.9 - 10.3 mg/dL Final  . GFR calc non Af Amer 02/01/2017 48* >60 mL/min Final  . GFR calc Af Amer 02/01/2017 56* >60 mL/min Final   Comment: (NOTE) The eGFR has been calculated using the CKD EPI equation. This calculation has not been validated in all clinical situations. eGFR's persistently <60 mL/min signify possible Chronic Kidney Disease.   . Anion gap 02/01/2017 9  5 - 15 Final  . Magnesium 02/01/2017 1.7  1.7 - 2.4 mg/dL Final  . Sodium 02/02/2017 142  135 - 145 mmol/L Final  . Potassium 02/02/2017 3.2* 3.5 - 5.1 mmol/L Final  . Chloride 02/02/2017 103  101 - 111 mmol/L Final  . CO2 02/02/2017 29  22 - 32 mmol/L Final  . Glucose, Bld 02/02/2017 99  65 - 99 mg/dL Final  . BUN 02/02/2017 16  6 - 20 mg/dL Final  . Creatinine, Ser 02/02/2017 1.20  0.61 - 1.24 mg/dL Final  . Calcium 02/02/2017 8.6* 8.9 - 10.3 mg/dL Final  . GFR calc non Af Amer 02/02/2017 55* >60 mL/min Final  . GFR calc Af Amer 02/02/2017 >60  >60 mL/min Final   Comment: (NOTE) The eGFR  has been calculated using the CKD EPI equation. This calculation has not been validated in all clinical situations. eGFR's persistently <60 mL/min signify possible Chronic Kidney Disease.   . Anion gap 02/02/2017 10  5 - 15 Final  Office Visit on 11/25/2016  Component Date Value Ref Range Status  . Ferritin 11/25/2016 99  20 - 380 ng/mL Final  . Iron 11/25/2016 32* 50 - 180 ug/dL Final  . TIBC 11/25/2016 331  250 - 425 ug/dL Final  . %SAT 11/25/2016 10* 15 - 60 % Final  . WBC 11/25/2016 9.6  3.8 - 10.8 K/uL Final  . RBC 11/25/2016 4.62  4.20 - 5.80 MIL/uL Final  . Hemoglobin 11/25/2016 11.7* 13.2 - 17.1 g/dL Final  . HCT 11/25/2016 38.6  38.5 - 50.0 % Final  . MCV 11/25/2016 83.5  80.0 - 100.0 fL Final  . MCH 11/25/2016 25.3* 27.0 - 33.0 pg Final  . MCHC 11/25/2016 30.3* 32.0 - 36.0 g/dL Final  . RDW 11/25/2016 14.8  11.0 - 15.0 % Final  . Platelets 11/25/2016 287  140 - 400 K/uL Final  . MPV  11/25/2016 9.7  7.5 - 12.5 fL Final  . Neutro Abs 11/25/2016 7776  1,500 - 7,800 cells/uL Final  . Lymphs Abs 11/25/2016 1056  850 - 3,900 cells/uL Final  . Monocytes Absolute 11/25/2016 672  200 - 950 cells/uL Final  . Eosinophils Absolute 11/25/2016 96  15 - 500 cells/uL Final  . Basophils Absolute 11/25/2016 0  0 - 200 cells/uL Final  . Neutrophils Relative % 11/25/2016 81  % Final  . Lymphocytes Relative 11/25/2016 11  % Final  . Monocytes Relative 11/25/2016 7  % Final  . Eosinophils Relative 11/25/2016 1  % Final  . Basophils Relative 11/25/2016 0  % Final  . Smear Review 11/25/2016 Criteria for review not met   Final  . Sodium 11/25/2016 142  135 - 146 mmol/L Final  . Potassium 11/25/2016 4.2  3.5 - 5.3 mmol/L Final  . Chloride 11/25/2016 102  98 - 110 mmol/L Final  . CO2 11/25/2016 32* 20 - 31 mmol/L Final  . Glucose, Bld 11/25/2016 102* 65 - 99 mg/dL Final  . BUN 11/25/2016 18  7 - 25 mg/dL Final  . Creat 11/25/2016 0.82  0.70 - 1.11 mg/dL Final   Comment:   For patients > or = 81 years of age: The upper reference limit for Creatinine is approximately 13% higher for people identified as African-American.     . Calcium 11/25/2016 9.4  8.6 - 10.3 mg/dL Final  . GFR, Est African American 11/25/2016 >89  >=60 mL/min Final  . GFR, Est Non African American 11/25/2016 83  >=60 mL/min Final  . Vit D, 25-Hydroxy 11/25/2016 25* 30 - 100 ng/mL Final   Comment: Vitamin D Status           25-OH Vitamin D        Deficiency                <20 ng/mL        Insufficiency         20 - 29 ng/mL        Optimal             > or = 30 ng/mL   For 25-OH Vitamin D testing on patients on D2-supplementation and patients for whom quantitation of D2 and D3 fractions is required, the QuestAssureD 25-OH VIT D, (D2,D3), LC/MS/MS is recommended: order code [id number] (  patients > 2 yrs).   . Brain Natriuretic Peptide 11/25/2016 149.5* <100 pg/mL Final   Comment:   BNP levels increase with age in the  general population with the highest values seen in individuals greater than 30 years of age. Reference: Joellyn Rued Cardiol 2002; 23:361-22.     . Vitamin B-12 11/25/2016 335  200 - 1,100 pg/mL Final  . Folate 11/25/2016 14.2  >5.4 ng/mL Final   Comment: Reference Range >17 years:   Low: <3.4 ng/mL              Borderline: 3.4-5.4 ng/mL              Normal: >5.4 ng/mL     . RPR Ser Ql 11/25/2016 NON REAC  NON REAC Final    US Renal  Result Date: 01/30/2017 CLINICAL DATA:  Acute kidney injury. EXAM: RENAL / URINARY TRACT ULTRASOUND COMPLETE COMPARISON:  None. FINDINGS: Right Kidney: Length: 11.2 cm. Echogenicity is increased. Moderate hydronephrosis visualized. Left Kidney: Length: 10.8. Echogenicity is increased. Moderate hydronephrosis visualized. Bladder: Irregular, trabeculated appearance of the bladder wall is seen. IMPRESSION: Moderate bilateral hydronephrosis. Increased cortical echogenicity bilaterally consistent with medical renal disease. Trabeculated appearance of the urinary bladder wall suggestive of bladder outlet obstruction. Electronically Signed   By: Inge Rise M.D.   On: 01/30/2017 09:26   Dg Chest Port 1 View  Result Date: 02/20/2017 CLINICAL DATA:  Cough. Altered mental status for 1 day. Leukocytosis. EXAM: PORTABLE CHEST 1 VIEW COMPARISON:  None. FINDINGS: Hazy opacity of the right more than left bases, primarily concerning for pneumonia in this setting. No edema, effusion, or pneumothorax. Normal heart size and mediastinal contours. IMPRESSION: Opacities at the right greater than left bases primarily concerning for pneumonia or aspiration in this setting. Electronically Signed   By: Monte Fantasia M.D.   On: 02/20/2017 14:15     Assessment/Plan   ICD-9-CM ICD-10-CM   1. Acute respiratory distress - NEW 518.82 R06.03   2. Fever of unknown origin (FUO) - failing to change as expected 780.60 R50.9   3. Tachycardia - failing to change as expected 785.0 R00.0   4.  Dehydration - failing to change as expected 276.51 E86.0   5. Other psychotic disorder not due to substance or known physiological condition - stable 298.9 F28   6. Dementia with behavioral disturbance, unspecified dementia type - stable 294.21 F03.91   7. HTN (hypertension), benign - BP elevated 401.1 I10   8. Bilateral hydronephrosis - with chronic indwelling foley cath 591 N13.30      Pt sent to the ED via EMS due to his unstable condition  Will follow once return to [facility] S. Perlie Gold  [facility] [address] [place], [place] [address] [phone] Cell (Monday-Friday 8 AM - 5 PM) [phone] After 5 PM and follow prompts

## 2017-02-20 NOTE — Progress Notes (Signed)
Pharmacy Antibiotic Note  Scott Park is a 83 y.o. male who was transported from NH to ED on 02/20/2017 with AMS and met criteria for Code Sepsis. Orders received to begin empiric Zosyn and vancomycin with pharmacy dosing assistance.  Patient has AKI with SCr 5.0 (baseline value 1.2), est CrCl ~ 12 mL/min.  Plan: Zosyn 3.375 g IV x 1, then 2.25 grams IV q8h Vancomycin 1 g IV x 1 Serum creatinine daily Vancomycin random level tomorrow AM Follow culture results, clinical course.  Weight: 182 lb (82.6 kg)  Temp (24hrs), Avg:99.7 F (37.6 C), Min:97.2 F (36.2 C), Max:100.6 F (38.1 C)   Recent Labs Lab 02/20/17 1337 02/20/17 1343 02/20/17 1406  WBC 26.0*  --   --   CREATININE  --   --  5.00*  LATICACIDVEN  --  2.04*  --     Estimated Creatinine Clearance: 12 mL/min (by C-G formula based on SCr of 5 mg/dL (H)).    Allergies  Allergen Reactions  . Codeine Anxiety  . Hydroxyzine Other (See Comments)    Makes things worse  . Seroquel [Quetiapine] Other (See Comments)    Makes things worse    Antimicrobials this admission: 3/1>>Zosyn 3/1>>vancomycin  Dose adjustments this admission:   Microbiology results: 3/1 BCx x 2: collected   Thank you for allowing pharmacy to be a part of this patient's care.  Clayburn Pert, PharmD, BCPS Pager: [phone] 02/20/2017  2:59 PM

## 2017-02-20 NOTE — ED Notes (Signed)
2nd set of blood cultures drawn 

## 2017-02-20 NOTE — H&P (Addendum)
[facility] History and Physical  Scott Park ZOX:[id number] DOB: Aug 19, 1933 DOA: 02/20/2017  Referring physician:  PCP: Pete Glatter, MD   Chief Complaint: Altered mental status*  HPI:    83 yo M with PMHx of dementia, HTN who presents with AMS. He is unable to provide history and most of the history is obtained from the patient's chart. According to nurse at  skilled nursing facility via telephone as well as report from EMS, patient has had increasing confusion over the last 2 days. He is normally alert and interactive, though he does have difficulty with orientation due to dementia. Yesterday, he was noted to be less responsive and was noted to have elevated white blood cell count on labs. Blood cultures were drawn and he was given IM Rocephin. Over the last 24 hours, however, he has had progressively worsening mental status and is less and less responsive. Patient was also in the ER on 2/17, but the patient had pulled out his chronic indwelling Foley, and was sent to the ER for replacement of his Foley catheter. Patient has a history of bladder outlet obstruction with renal failure, therefore Foley catheter was replaced and patient was discharged back to SNF. ED course BP 152/80   Pulse 98   Temp 100.6 F (38.1 C) (Core (Comment)) Comment (Src): temp foley  Resp 16   Wt 182 lb  On arrival, pt tachycardic, confused, altered. Noted to have large bladder on exam with bladder scan >1L - foley malfunctioning, replaced by ER physician, creatinine 5.0. White blood cell count 26,000 Suspected to have severe sepsis 2/2 UTI, c/b urinary obstruction and post-renal AKI. CODE SEPSIS initiated, Vanc/Zosyn given, patient received 2 L of IV normal saline. CXR also c/f aspiration PNA - ABX should cover this.      Review of Systems: negative for the following  Unable to obtain due to altered mental status      Past Medical History:  Diagnosis Date  . Anemia   . Dementia    with  behavioral disturbance  . Hydronephrosis   . Hypertension   . SunDown syndrome      Past Surgical History:  Procedure Laterality Date  . BYPASS GRAFT Right    artery bypass  . TIBIA FRACTURE SURGERY     R shin      Social History:  reports that he has never smoked. He has never used smokeless tobacco. He reports that he does not drink alcohol or use drugs.    Allergies  Allergen Reactions  . Codeine Anxiety  . Hydroxyzine Other (See Comments)    Makes things worse  . Seroquel [Quetiapine] Other (See Comments)    Makes things worse    Family History  Problem Relation Age of Onset  . Cancer Mother   . Cancer Father     prostate         Prior to Admission medications   Medication Sig Start Date End Date Taking? Authorizing Provider  Amino Acids-Protein Hydrolys (FEEDING SUPPLEMENT, PRO-STAT SUGAR FREE 64,) LIQD Take 30 mLs by mouth 2 (two) times daily. 02/02/17  Yes Elease Etienne, MD  amLODipine (NORVASC) 5 MG tablet Take 1 tablet (5 mg total) by mouth daily. 02/03/17  Yes Elease Etienne, MD  aspirin EC 81 MG tablet Take 81 mg by mouth daily.   Yes Historical Provider, MD  atenolol (TENORMIN) 25 MG tablet Take 1 tablet (25 mg total) by mouth daily. 11/25/16  Yes Pete Glatter, MD  bisacodyl (DULCOLAX) 5 MG EC tablet Take 5 mg by mouth daily as needed for moderate constipation.   Yes Historical Provider, MD  cefTRIAXone (ROCEPHIN) 1 g injection Inject 1 g into the muscle daily. Inject 1 gram intramuscular one time a day for infection until IV Rocephin arrives  Started 03/01   Yes Historical Provider, MD  LORazepam (ATIVAN) 2 MG/ML injection Inject 1 mg into the muscle every 12 (twelve) hours as needed (for agitation). Inject 0.5 ml intramuscular every 12 hours as needed    Yes Historical Provider, MD  Multiple Vitamin (MULTIVITAMIN WITH MINERALS) TABS tablet Take 1 tablet by mouth daily. 02/03/17  Yes Elease Etienne, MD  Nutritional Supplements (NUTRITIONAL DRINK)  LIQD Take 90 mLs by mouth daily. *House Supplement 2.0*   Yes Historical Provider, MD  risperiDONE (RISPERDAL) 0.5 MG tablet Take 0.5 mg by mouth 2 (two) times daily.   Yes Historical Provider, MD  tamsulosin (FLOMAX) 0.4 MG CAPS capsule Take 1 capsule (0.4 mg total) by mouth daily. 02/03/17  Yes Elease Etienne, MD     Physical Exam: Vitals:   02/20/17 1400 02/20/17 1400 02/20/17 1400 02/20/17 1549  BP: 104/55   152/80  Pulse: 108   98  Resp: (!) 39   16  Temp: 100.6 F (38.1 C) 100.6 F (38.1 C)    TempSrc:  Core (Comment)    SpO2: 99%   100%  Weight:   82.6 kg (182 lb)       Constitutional: NAD, calm, comfortable Vitals:   02/20/17 1400 02/20/17 1400 02/20/17 1400 02/20/17 1549  BP: 104/55   152/80  Pulse: 108   98  Resp: (!) 39   16  Temp: 100.6 F (38.1 C) 100.6 F (38.1 C)    TempSrc:  Core (Comment)    SpO2: 99%   100%  Weight:   82.6 kg (182 lb)    Cardiovascular: Normal rate, regular rhythm and normal heart sounds.   No murmur heard. Pulmonary/Chest: Effort normal and breath sounds normal. He has no wheezes. He has no rales. He exhibits no tenderness.  Abdominal: Soft. Bowel sounds are normal. He exhibits no distension and no mass. There is no tenderness.  Musculoskeletal: Normal range of motion. He exhibits no edema.  Chronic scarring of the right distal lower leg and foot.  Lymphadenopathy:    He has no cervical adenopathy.  Neurological: He is alert. No cranial nerve deficit. He exhibits normal muscle tone. Coordination normal.  Oriented to person and place but not time.  Skin: Skin is warm and dry. No rash noted.  Psychiatric: He has a normal mood and affect. Judgment and thought content normal. He is agitated.  Intermittently agitated.  Nursing note and vitals reviewed.    Labs on Admission: I have personally reviewed following labs and imaging studies  CBC:  Recent Labs Lab 02/20/17 1337 02/20/17 1406  WBC 26.0*  --   NEUTROABS 24.4*  --    HGB 8.0* 18.4*  HCT 25.6* 54.0*  MCV 75.1*  --   PLT 340  --     Basic Metabolic Panel:  Recent Labs Lab 02/20/17 1337 02/20/17 1406  NA 147* 148*  K 4.6 4.5  CL 112* 111  CO2 20*  --   GLUCOSE 146* 143*  BUN 100* 89*  CREATININE 4.97* 5.00*  CALCIUM 8.5*  --     GFR: Estimated Creatinine Clearance: 12 mL/min (by C-G formula based on SCr of 5 mg/dL (H)).  Liver Function  Tests:  Recent Labs Lab 02/20/17 1337  AST 45*  ALT 24  ALKPHOS 32*  BILITOT 0.8  PROT 6.1*  ALBUMIN 2.5*   No results for input(s): LIPASE, AMYLASE in the last 168 hours. No results for input(s): AMMONIA in the last 168 hours.  Coagulation Profile: No results for input(s): INR, PROTIME in the last 168 hours. No results for input(s): DDIMER in the last 72 hours.  Cardiac Enzymes: No results for input(s): CKTOTAL, CKMB, CKMBINDEX, TROPONINI in the last 168 hours.  BNP (last 3 results) No results for input(s): PROBNP in the last 8760 hours.  HbA1C: No results for input(s): HGBA1C in the last 72 hours. No results found for: HGBA1C   CBG: No results for input(s): GLUCAP in the last 168 hours.  Lipid Profile: No results for input(s): CHOL, HDL, LDLCALC, TRIG, CHOLHDL, LDLDIRECT in the last 72 hours.  Thyroid Function Tests: No results for input(s): TSH, T4TOTAL, FREET4, T3FREE, THYROIDAB in the last 72 hours.  Anemia Panel: No results for input(s): VITAMINB12, FOLATE, FERRITIN, TIBC, IRON, RETICCTPCT in the last 72 hours.  Urine analysis:    Component Value Date/Time   COLORURINE YELLOW 02/20/2017 1412   APPEARANCEUR TURBID (A) 02/20/2017 1412   LABSPEC 1.010 02/20/2017 1412   PHURINE 6.0 02/20/2017 1412   GLUCOSEU NEGATIVE 02/20/2017 1412   HGBUR LARGE (A) 02/20/2017 1412   BILIRUBINUR NEGATIVE 02/20/2017 1412   KETONESUR NEGATIVE 02/20/2017 1412   PROTEINUR 100 (A) 02/20/2017 1412   NITRITE NEGATIVE 02/20/2017 1412   LEUKOCYTESUR LARGE (A) 02/20/2017 1412    Sepsis  Labs: @LABRCNTIP (procalcitonin:4,lacticidven:4) )No results found for this or any previous visit (from the past 240 hour(s)).       Radiological Exams on Admission: Dg Chest Port 1 View  Result Date: 02/20/2017 CLINICAL DATA:  Cough. Altered mental status for 1 day. Leukocytosis. EXAM: PORTABLE CHEST 1 VIEW COMPARISON:  None. FINDINGS: Hazy opacity of the right more than left bases, primarily concerning for pneumonia in this setting. No edema, effusion, or pneumothorax. Normal heart size and mediastinal contours. IMPRESSION: Opacities at the right greater than left bases primarily concerning for pneumonia or aspiration in this setting. Electronically Signed   By: Marnee SpringJonathon  Watts M.D.   On: 02/20/2017 14:15   [place] Renal  Result Date: 01/30/2017 CLINICAL DATA:  Acute kidney injury. EXAM: RENAL / URINARY TRACT ULTRASOUND COMPLETE COMPARISON:  None. FINDINGS: Right Kidney: Length: 11.2 cm. Echogenicity is increased. Moderate hydronephrosis visualized. Left Kidney: Length: 10.8. Echogenicity is increased. Moderate hydronephrosis visualized. Bladder: Irregular, trabeculated appearance of the bladder wall is seen. IMPRESSION: Moderate bilateral hydronephrosis. Increased cortical echogenicity bilaterally consistent with medical renal disease. Trabeculated appearance of the urinary bladder wall suggestive of bladder outlet obstruction. Electronically Signed   By: Drusilla Kannerhomas  Dalessio M.D.   On: 01/30/2017 09:26   Dg Chest Port 1 View  Result Date: 02/20/2017 CLINICAL DATA:  Cough. Altered mental status for 1 day. Leukocytosis. EXAM: PORTABLE CHEST 1 VIEW COMPARISON:  None. FINDINGS: Hazy opacity of the right more than left bases, primarily concerning for pneumonia in this setting. No edema, effusion, or pneumothorax. Normal heart size and mediastinal contours. IMPRESSION: Opacities at the right greater than left bases primarily concerning for pneumonia or aspiration in this setting. Electronically Signed   By:  Marnee SpringJonathon  Watts M.D.   On: 02/20/2017 14:15      EKG: Independently reviewed. * EKG Interpretation  Date/Time:  Thursday February 20 2017 13:31:38 EST Ventricular Rate:   109 PR Interval:                        QRS Duration:        85 QT Interval:                      311 QTC Calculation:    419 R Axis:                         19 Text Interpretation:  Sinus tachycardia Atrial premature complexes  Assessment/Plan Principal Problem:   Sepsis (HCC) secondary to UTI, urinary retention,HCAP Agree with broad-spectrum antibiotics Follow urine culture, blood culture Admit to step down Patient is a full code   Recent right foot cellulitis/left heel wound Treated with broad-spectrum antibiotics, patient was discharged on by mouth doxycycline on 01/30/17 for another 4 days Will obtain wound care consultation to evaluate  Acute delirium in the setting of dementia Likely secondary to #1, continue risperidone Minimize sedating medications  Normocytic anemia Hemoglobin at a baseline of about 8, hemoglobin stable without any active bleeding  History of acute kidney injury with bladder obstruction resulting in bilateral hydronephrosis Creatinine has increased from 1.2> 5.0 today Will continue aggressive fluid hydration, Foley, strict I's and O's      DVT prophylaxis: Lovenox     Code Status Orders Full code        consults called:  Family Communication: Admission, patients condition and plan of care including tests being ordered have been discussed with the patient  who indicates understanding and agree with the plan and Code Status  Admission status: inpatient    Disposition plan: Further plan will depend as patient's clinical course evolves and further radiologic and laboratory data become available. Likely home when stable   At the time of admission, it appears that the appropriate admission status for this patient is INPATIENT .Thisis judged to be  reasonable and necessary in order to provide the required intensity of service to ensure the patient's safetygiven thepresenting symptoms, physical exam findings, and initial radiographic and laboratory data in the context of their chronic comorbidities.   Richarda Overlie MD [facility] Pager [phone]  If 7PM-7AM, please contact night-coverage [email or web address] Password [email or web address]  02/20/2017, 4:11 PM

## 2017-02-20 NOTE — ED Notes (Signed)
Pt changed, small (scant) bm.  Pt groin is red, applied cream after cleansing.  Pt buttock is red and pressure sore, dressing removed due to being soiled with drainage and with a small amount of stool.  Placed barrier cream on this.  Repositioned pt to left and off of buttock to give relief to area with pressure sore.  Pt also has skin redness on back, family member thinks it may be from a fall

## 2017-02-20 NOTE — Progress Notes (Signed)
Raquell,RN from [facility] called to get updates on pt. Updates given on the pt, RN states she will call back tomorrow.

## 2017-02-20 NOTE — ED Notes (Signed)
1st set blood cultures drawn

## 2017-02-20 NOTE — ED Notes (Signed)
Report to 2w, discussed pt bleeding from cath, turbid urine and skin issues with RN

## 2017-02-20 NOTE — Progress Notes (Signed)
CRITICAL VALUE ALERT  Critical value received:  + MRSA PCR   Date of notification:  02/20/17  Time of notification:  2005   Critical value read back:Yes.    Nurse who received alert:  S.Jalacia Mattila,RN  MD notified (1st page):  Standing orders initiated  Time of first page:    MD notified (2nd page):  Time of second page:  Responding MD:    Time MD responded:

## 2017-02-20 NOTE — ED Notes (Signed)
Abnormal lab result MD Erma HeritageIsaacs and RN rachel have been made aware

## 2017-02-20 NOTE — ED Notes (Signed)
Switched pt to NC2L .  Will continue to observe.  3rd liter of fluid hung, EDP checked on pt on the bedside

## 2017-02-20 NOTE — Clinical Social Work Note (Signed)
Clinical Social Work Assessment  Patient Details  Name: Scott Park MRN: [id number] Date of Birth: January 17, 1933  Date of referral:  02/20/17               Reason for consult:  Facility Placement                Permission sought to share information with:  Facility Sport and exercise psychologist, Family Supports Permission granted to share information::  Yes, Verbal Permission Granted  Name::        Agency::     Relationship::     Contact Information:     Housing/Transportation Living arrangements for the past 2 months:  [place] of Information:  Adult Children Patient Interpreter Needed:  None Criminal Activity/Legal Involvement Pertinent to Current Situation/Hospitalization:    Significant Relationships:  Adult Children Lives with:  Adult Children Do you feel safe going back to the place where you live?  Yes Need for family participation in patient care:  Yes (Comment)  Care giving concerns:  None listed by pt/family   Social Worker assessment / plan:  CSW met with pt's son and confirmed pt's son plan for the pt to be discharged either back to [facility] for more short-term rehab and/or to [facility].  Per the pt/'s son Scott Park the pt has already been accepted to [facility] from [facility].  Per pt's son the pt was "all set to transfer to [facility] when the pt suddenly became acute.  Pt had been living independently with his son prior to a hospitalization in early February of 5-6 days, per pt's son and the chart.  This past hospitalization in early February resulted in the pt's D/C to [facility]F.   Employment status:  Retired Health visitor, Managed Care PT Recommendations:  Not assessed at this time Information / Referral to community resources:     Patient/Family's Response to care:  Patient not alert and oriented.  Patient's son agreeable to plan.  Pt's son supportive and strongly  involved in pt.'s care.  Pt.'s son Scott Park pleasant and appreciated CSW intervention.     Patient/Family's Understanding of and Emotional Response to Diagnosis, Current Treatment, and Prognosis:  Still assessing    Emotional Assessment Appearance:  Appears stated age Attitude/Demeanor/Rapport:  Unable to Assess Affect (typically observed):    Orientation:  Fluctuating Orientation (Suspected and/or reported Sundowners) (Pt has dementia) Alcohol / Substance use:    Psych involvement (Current and /or in the community):     Discharge Needs  Concerns to be addressed:  No discharge needs identified Readmission within the last 30 days:  Yes Current discharge risk:  None Barriers to Discharge:  No Barriers Identified   Claudine Mouton, LCSWA 02/20/2017, 6:49 PM

## 2017-02-21 DIAGNOSIS — J690 Pneumonitis due to inhalation of food and vomit: Secondary | ICD-10-CM

## 2017-02-21 DIAGNOSIS — F0391 Unspecified dementia with behavioral disturbance: Secondary | ICD-10-CM

## 2017-02-21 DIAGNOSIS — R7881 Bacteremia: Secondary | ICD-10-CM

## 2017-02-21 DIAGNOSIS — N183 Chronic kidney disease, stage 3 unspecified: Secondary | ICD-10-CM

## 2017-02-21 DIAGNOSIS — T83511A Infection and inflammatory reaction due to indwelling urethral catheter, initial encounter: Principal | ICD-10-CM

## 2017-02-21 DIAGNOSIS — I10 Essential (primary) hypertension: Secondary | ICD-10-CM

## 2017-02-21 DIAGNOSIS — N320 Bladder-neck obstruction: Secondary | ICD-10-CM

## 2017-02-21 DIAGNOSIS — N179 Acute kidney failure, unspecified: Secondary | ICD-10-CM

## 2017-02-21 DIAGNOSIS — A4151 Sepsis due to Escherichia coli [E. coli]: Secondary | ICD-10-CM

## 2017-02-21 DIAGNOSIS — N390 Urinary tract infection, site not specified: Secondary | ICD-10-CM

## 2017-02-21 DIAGNOSIS — G9340 Encephalopathy, unspecified: Secondary | ICD-10-CM

## 2017-02-21 LAB — BLOOD CULTURE ID PANEL (REFLEXED)
ACINETOBACTER BAUMANNII: NOT DETECTED
CANDIDA ALBICANS: NOT DETECTED
CANDIDA KRUSEI: NOT DETECTED
CANDIDA PARAPSILOSIS: NOT DETECTED
CARBAPENEM RESISTANCE: NOT DETECTED
Candida glabrata: NOT DETECTED
Candida tropicalis: NOT DETECTED
ENTEROBACTER CLOACAE COMPLEX: NOT DETECTED
ENTEROBACTERIACEAE SPECIES: DETECTED — AB
Enterococcus species: NOT DETECTED
Escherichia coli: DETECTED — AB
Haemophilus influenzae: NOT DETECTED
KLEBSIELLA OXYTOCA: NOT DETECTED
KLEBSIELLA PNEUMONIAE: NOT DETECTED
Listeria monocytogenes: NOT DETECTED
Neisseria meningitidis: NOT DETECTED
PROTEUS SPECIES: NOT DETECTED
PSEUDOMONAS AERUGINOSA: NOT DETECTED
STREPTOCOCCUS PYOGENES: NOT DETECTED
Serratia marcescens: NOT DETECTED
Staphylococcus aureus (BCID): NOT DETECTED
Staphylococcus species: NOT DETECTED
Streptococcus agalactiae: NOT DETECTED
Streptococcus pneumoniae: NOT DETECTED
Streptococcus species: NOT DETECTED

## 2017-02-21 LAB — COMPREHENSIVE METABOLIC PANEL
ALBUMIN: 2.3 g/dL — AB (ref 3.5–5.0)
ALK PHOS: 31 U/L — AB (ref 38–126)
ALT: 25 U/L (ref 17–63)
AST: 36 U/L (ref 15–41)
Anion gap: 13 (ref 5–15)
BILIRUBIN TOTAL: 0.7 mg/dL (ref 0.3–1.2)
BUN: 88 mg/dL — AB (ref 6–20)
CO2: 21 mmol/L — ABNORMAL LOW (ref 22–32)
CREATININE: 3.48 mg/dL — AB (ref 0.61–1.24)
Calcium: 8.4 mg/dL — ABNORMAL LOW (ref 8.9–10.3)
Chloride: 118 mmol/L — ABNORMAL HIGH (ref 101–111)
GFR calc Af Amer: 18 mL/min — ABNORMAL LOW (ref >60)
GFR, EST NON AFRICAN AMERICAN: 15 mL/min — AB (ref >60)
GLUCOSE: 124 mg/dL — AB (ref 65–99)
POTASSIUM: 4 mmol/L (ref 3.5–5.1)
Sodium: 152 mmol/L — ABNORMAL HIGH (ref 135–145)
TOTAL PROTEIN: 5.9 g/dL — AB (ref 6.5–8.1)

## 2017-02-21 LAB — CBC
HEMATOCRIT: 29.8 % — AB (ref 39.0–52.0)
Hemoglobin: 9.2 g/dL — ABNORMAL LOW (ref 13.0–17.0)
MCH: 23.7 pg — ABNORMAL LOW (ref 26.0–34.0)
MCHC: 30.9 g/dL (ref 30.0–36.0)
MCV: 76.8 fL — ABNORMAL LOW (ref 78.0–100.0)
PLATELETS: 231 10*3/uL (ref 150–400)
RBC: 3.88 MIL/uL — ABNORMAL LOW (ref 4.22–5.81)
RDW: 16.2 % — AB (ref 11.5–15.5)
WBC: 25 10*3/uL — ABNORMAL HIGH (ref 4.0–10.5)

## 2017-02-21 LAB — HEMOGLOBIN A1C
Hgb A1c MFr Bld: 5.3 % (ref 4.8–5.6)
Mean Plasma Glucose: 105 mg/dL

## 2017-02-21 LAB — VANCOMYCIN, RANDOM: Vancomycin Rm: 11

## 2017-02-21 LAB — TROPONIN I: Troponin I: 0.09 ng/mL (ref <0.03)

## 2017-02-21 MED ORDER — PIPERACILLIN-TAZOBACTAM 3.375 G IVPB
3.3750 g | Freq: Three times a day (TID) | INTRAVENOUS | Status: DC
  Administered 2017-02-21 – 2017-02-23 (×7): 3.375 g via INTRAVENOUS
  Filled 2017-02-21 (×9): qty 50

## 2017-02-21 MED ORDER — SODIUM CHLORIDE 0.45 % IV SOLN
INTRAVENOUS | Status: DC
  Filled 2017-02-21: qty 1000

## 2017-02-21 MED ORDER — SODIUM CHLORIDE 0.45 % IV SOLN
INTRAVENOUS | Status: DC
  Administered 2017-02-21 – 2017-02-22 (×3): via INTRAVENOUS

## 2017-02-21 MED ORDER — VANCOMYCIN HCL IN DEXTROSE 1-5 GM/200ML-% IV SOLN: 1000.0000 mg | Freq: Once | INTRAVENOUS | Status: DC

## 2017-02-21 MED ORDER — HALOPERIDOL LACTATE 5 MG/ML IJ SOLN: 5.0000 mg | Freq: Four times a day (QID) | INTRAMUSCULAR | Status: DC | PRN

## 2017-02-21 MED ORDER — HYDRALAZINE HCL 20 MG/ML IJ SOLN: 5.0000 mg | Freq: Four times a day (QID) | INTRAMUSCULAR | Status: DC | PRN

## 2017-02-21 NOTE — Progress Notes (Signed)
LCSW following for dc needs/ [facility]   [facility] LCSW followed up with nursing home with any issues of patient returning when medically stable. SNF reports behaviors at SNF, but patient at this time can return.  Family agreeable to plan.  LCSW updated FL2 as well as sent clinicals to facility.  Will continue to follow for needs.  Scott EmoryHannah Diamonique Ruedas LCSW, MSW Clinical Social Work: Optician, dispensingystem [employer] Coverage for :  [phone]

## 2017-02-21 NOTE — NC FL2 (Signed)
[place] MEDICAID FL2 LEVEL OF CARE SCREENING TOOL     IDENTIFICATION  Patient Name: Scott Park Birthdate: 06-28-1933 Sex: male Admission Date (Current Location): 02/20/2017  [facility] and [place] Number:  Producer, television/film/video and Address:  [facility],  [address] [place], [place] [address]      Provider Number: [id number]  Attending Physician Name and Address:  Catarina Hartshorn, MD  Relative Name and Phone Number:  Marja Kays, son [phone]    Current Level of Care: Hospital Recommended Level of Care: Skilled Nursing Facility Prior Approval Number:    Date Approved/Denied:   PASRR Number: [id number]  Discharge Plan: SNF    Current Diagnoses: Patient Active Problem List   Diagnosis Date Noted  . E. coli sepsis (HCC) 02/21/2017  . E coli bacteremia 02/21/2017  . Acute renal failure superimposed on stage 3 chronic kidney disease (HCC) 02/21/2017  . Acute encephalopathy 02/21/2017  . Urinary tract infection associated with indwelling urethral catheter (HCC)   . Sepsis (HCC) 02/20/2017  . Pressure injury of skin 01/30/2017  . Bladder outlet obstruction 01/30/2017  . Bilateral hydronephrosis 01/30/2017  . Cellulitis of right foot 01/29/2017  . Normocytic anemia 01/29/2017  . AKI (acute kidney injury) (HCC) 01/29/2017  . Open wound of left heel 01/29/2017  . HTN (hypertension), benign 11/25/2016  . Dementia with behavioral disturbance 11/25/2016  . Pedal edema 11/25/2016    Orientation RESPIRATION BLADDER Height & Weight     Self  O2 (2L) Incontinent Weight: 151 lb 14.4 oz (68.9 kg) Height:  5\' 9"  (175.3 cm)  BEHAVIORAL SYMPTOMS/MOOD NEUROLOGICAL BOWEL NUTRITION STATUS      Incontinent Diet (see DC summary)  AMBULATORY STATUS COMMUNICATION OF NEEDS Skin   Supervision Verbally PU Stage and Appropriate Care, Bruising, Skin abrasions, Other (Comment) (Stage II -  Partial thickness loss of dermis presenting as a shallow open ulcer with a red, pink wound bed  without slough.)   PU Stage 2 Dressing:  (PRN, foam dressing)    Reason for Consult: Consult requested for bilat feet.  Pt has been followed by a podiatrist prior to admission and had surgical intervention to right leg approx 17 years ago, according to the EMR. Wound type: Left plantar heel with chronic full thickness wound; 1X3X.6cm, 70% red, 30% yellow, small amt tan drainage, no odor or fluctuance. Pressure Injury POA: This was present on admission but is NOT a pressure injury. Right leg with appearance consistent with previous skin graft which had healed and now has patchy areas of dry scabs along the old incision line. Measurement:Right heel with dry yellow callous, .2X.2cm, no open wound, odor, drainage, or fluctuance; no topical treatment indicated. Right calf with several patchy areas of dry dark reddish brown scabs; no odor, drainage, or fluctuance.5X.2cm, 1X.2cm, 1.2X.2cm Dressing procedure/placement/frequency: Bactroban to promote moist healing to left heel and right leg scabbed areas.  Float heels to reduce pressure.  Pt should resume follow-up with podiatrist after discharge.                   Personal Care Assistance Level of Assistance  Bathing, Feeding, Dressing Bathing Assistance: Limited assistance Feeding assistance: Independent Dressing Assistance: Limited assistance     Functional Limitations Info             SPECIAL CARE FACTORS FREQUENCY  PT (By licensed PT), OT (By licensed OT)     PT Frequency: 5x OT Frequency: 5x            Contractures  Contractures Info: Not present    Additional Factors Info  Code Status, Allergies, Isolation Precautions Code Status Info: Full Code Allergies Info:  Codeine, Hydroxyzine, Seroquel Quetiapine     Isolation Precautions Info: MRSA positive , PCR     Current Medications (02/21/2017):  This is the current hospital active medication list Current Facility-Administered Medications  Medication Dose Route Frequency  Provider Last Rate Last Dose  . acetaminophen (TYLENOL) tablet 650 mg  650 mg Oral Q6H PRN Richarda OverlieNayana Abrol, MD       Or  . acetaminophen (TYLENOL) suppository 650 mg  650 mg Rectal Q6H PRN Richarda OverlieNayana Abrol, MD      . chlorhexidine (PERIDEX) 0.12 % solution 15 mL  15 mL Mouth Rinse BID Richarda OverlieNayana Abrol, MD   15 mL at 02/21/17 0800  . Chlorhexidine Gluconate Cloth 2 % PADS 6 each  6 each Topical Q0600 Richarda OverlieNayana Abrol, MD   6 each at 02/21/17 0528  . feeding supplement (ENSURE ENLIVE) (ENSURE ENLIVE) liquid 237 mL  237 mL Oral BID BM Richarda OverlieNayana Abrol, MD      . haloperidol lactate (HALDOL) injection 5 mg  5 mg Intravenous Q6H PRN Catarina Hartshornavid Tat, MD      . heparin injection 5,000 Units  5,000 Units Subcutaneous Q8H Richarda OverlieNayana Abrol, MD   5,000 Units at 02/21/17 0528  . hydrALAZINE (APRESOLINE) injection 5 mg  5 mg Intravenous Q6H PRN Catarina Hartshornavid Tat, MD      . levalbuterol (XOPENEX) nebulizer solution 0.63 mg  0.63 mg Nebulization Q6H PRN Richarda OverlieNayana Abrol, MD      . MEDLINE mouth rinse  15 mL Mouth Rinse q12n4p Richarda OverlieNayana Abrol, MD   15 mL at 02/20/17 2000  . mupirocin ointment (BACTROBAN) 2 % 1 application  1 application Nasal BID Richarda OverlieNayana Abrol, MD   1 application at 02/21/17 0916  . ondansetron (ZOFRAN) tablet 4 mg  4 mg Oral Q6H PRN Richarda OverlieNayana Abrol, MD       Or  . ondansetron (ZOFRAN) injection 4 mg  4 mg Intravenous Q6H PRN Richarda OverlieNayana Abrol, MD      . pantoprazole (PROTONIX) EC tablet 40 mg  40 mg Oral Daily Nayana Abrol, MD      . piperacillin-tazobactam (ZOSYN) IVPB 3.375 g  3.375 g Intravenous Q8H Randall K Absher, RPH      . risperiDONE (RISPERDAL) tablet 0.5 mg  0.5 mg Oral BID Richarda OverlieNayana Abrol, MD      . sodium chloride 0.45 % 1,000 mL infusion   Intravenous Continuous Onalee Huaavid Tat, MD      . tamsulosin (FLOMAX) capsule 0.4 mg  0.4 mg Oral Daily Richarda OverlieNayana Abrol, MD         Discharge Medications: Please see discharge summary for a list of discharge medications.  Relevant Imaging Results:  Relevant Lab Results:   Additional  Information SSN: [id number]  Murel Shenberger, Evlyn CourierHannah N, [place]

## 2017-02-21 NOTE — Progress Notes (Signed)
SLP Cancellation Note  Patient Details Name: Stann MainlandJohn Ratay MRN: [id number] DOB: 09/29/1933   Cancelled treatment:       Reason Eval/Treat Not Completed: Patient's level of consciousness.   Erwin Nishiyama, Riley NearingBonnie Caroline 02/21/2017, 2:56 PM

## 2017-02-21 NOTE — Progress Notes (Signed)
PROGRESS NOTE  Scott Park ZOX:[id number] DOB: Feb 03, 1933 DOA: 02/20/2017 PCP: Pete Glatter, MD  Brief History:  83 year old male with a history of dementia, hypertension, chronic bladder outlet obstruction presenting with altered mental status from his SNF.  The patient is unable to provide any significant history. All of this history is obtained from review of his medical record. Apparently, the patient was noted to have increasing confusion over the past 2 days prior to admission by nursing staff at his SNF. Although the patient has dementia, the patient is normally alert and interactive. Blood work at [facility] noted leukocytosis. Blood cultures were obtained and the patient was given IM ceftriaxone. However, the patient continued to be less responsive. As result, he presented to emergency department for further evaluation. In the ED, the patient was noted to have WBC 26.0 with serum creatinine 4.97. Patient was notedto have large bladder on exam with bladder scan >1L - foley exchanged with drainage of purulent, yellow urine.  The patient was started on IV fluids and intravenous antibiotics after cultures were obtained.  Assessment/Plan: Sepsis -Secondary to bacteremia, HCAP and UTI -Continue Zosyn pending culture data -Lactic acid 2.04>>> 1.3 -Urinalysis TNTC WBC -Chest x-ray R>L basilar opacities -Preliminary blood culture = E.coli  Bacteremia -Preliminary blood culture = E. Coli -source is urine -Discontinue vancomycin -Continue Zosyn pending final data  HCAP -concerned about a component of aspiration -continue zosyn -speech therapy eval once more alert  UTI with indwelling foley catheter -foley from SNF clogged, not draining, replaced in ED -continue zosyn pending culture data  Acute on chronic renal failure--CKD stage III -Baseline creatinine 1.2-1.3 -Continue IV fluids -Presenting creatinine 4.97  Hypernatremia -Start hypotonic fluid  Acute  encephalopathy -Secondary to acute on chronic renal failure and infectious process  Chronic bladder outlet obstruction -Foley catheter was exchaged in the emergency department this admission -continue Flomax  Dementia with behavior disturbance -Risperdal bid if able to take po -haldol prn agitation  Hypertension -Holding atenolol secondary to soft blood pressure  Sacral and heel decubiti -Present at the time of admission -One care nurse consult       Disposition Plan:   SNF in 2-3 days  Family Communication:  No Family at bedside  Consultants:  none  Code Status:  FULL  DVT Prophylaxis:  [place] Heparin   Procedures: As Listed in Progress Note Above  Antibiotics: Vancomycin 3/1>>>3/2 Zosyn 3/1>>>    Subjective: The patient is awake and alert but nonconversant. He does not follow commands. He is able to state his name. He does not answer questions appropriately. No reports of respiratory distress, uncontrolled pain, diarrhea, vomiting.  Objective: Vitals:   02/21/17 0400 02/21/17 0600 02/21/17 0800 02/21/17 0900  BP: (!) 147/39 (!) 113/42 (!) 106/34   Pulse: (!) 109 98  95  Resp: (!) 32 (!) 27 (!) 29 (!) 30  Temp: 98.6 F (37 C) 99 F (37.2 C) 98.8 F (37.1 C) 98.8 F (37.1 C)  TempSrc:   Core (Comment)   SpO2: 100% 100%  100%  Weight:      Height:        Intake/Output Summary (Last 24 hours) at 02/21/17 0918 Last data filed at 02/21/17 0900  Gross per 24 hour  Intake             6420 ml  Output             2625 ml  Net  3795 ml   Weight change:  Exam:   General:  Pt is alert, follows commands appropriately, not in acute distress  HEENT: No icterus, No thrush, No neck mass, [place]/AT  Cardiovascular: RRR, S1/S2, no rubs, no gallops  Respiratory: Bibasilar crackles. No wheezing. Good air movement.  Abdomen: Soft/+BS, non tender, non distended, no guarding  Extremities: No edema, No lymphangitis, No petechiae, No rashes, no synovitis;  bilateral heel decubiti without crepitance or necrosis.   Data Reviewed: I have personally reviewed following labs and imaging studies Basic Metabolic Panel:  Recent Labs Lab 02/20/17 1337 02/20/17 1406 02/21/17 0427  NA 147* 148* 152*  K 4.6 4.5 4.0  CL 112* 111 118*  CO2 20*  --  21*  GLUCOSE 146* 143* 124*  BUN 100* 89* 88*  CREATININE 4.97* 5.00* 3.48*  CALCIUM 8.5*  --  8.4*  MG 2.2  --   --    Liver Function Tests:  Recent Labs Lab 02/20/17 1337 02/21/17 0427  AST 45* 36  ALT 24 25  ALKPHOS 32* 31*  BILITOT 0.8 0.7  PROT 6.1* 5.9*  ALBUMIN 2.5* 2.3*   No results for input(s): LIPASE, AMYLASE in the last 168 hours. No results for input(s): AMMONIA in the last 168 hours. Coagulation Profile:  Recent Labs Lab 02/20/17 1337  INR 1.39   CBC:  Recent Labs Lab 02/20/17 1337 02/20/17 1406 02/21/17 0427  WBC 26.0*  --  25.0*  NEUTROABS 24.4*  --   --   HGB 8.0* 18.4* 9.2*  HCT 25.6* 54.0* 29.8*  MCV 75.1*  --  76.8*  PLT 340  --  231   Cardiac Enzymes:  Recent Labs Lab 02/20/17 1337 02/20/17 2226 02/21/17 0427  TROPONINI 0.19* 0.10* 0.09*   BNP: Invalid input(s): POCBNP CBG: No results for input(s): GLUCAP in the last 168 hours. HbA1C:  Recent Labs  02/20/17 1337  HGBA1C 5.3   Urine analysis:    Component Value Date/Time   COLORURINE YELLOW 02/20/2017 1412   APPEARANCEUR TURBID (A) 02/20/2017 1412   LABSPEC 1.010 02/20/2017 1412   PHURINE 6.0 02/20/2017 1412   GLUCOSEU NEGATIVE 02/20/2017 1412   HGBUR LARGE (A) 02/20/2017 1412   BILIRUBINUR NEGATIVE 02/20/2017 1412   KETONESUR NEGATIVE 02/20/2017 1412   PROTEINUR 100 (A) 02/20/2017 1412   NITRITE NEGATIVE 02/20/2017 1412   LEUKOCYTESUR LARGE (A) 02/20/2017 1412   Sepsis Labs: @LABRCNTIP (procalcitonin:4,lacticidven:4) ) Recent Results (from the past 240 hour(s))  Blood Culture (routine x 2)     Status: None (Preliminary result)   Collection Time: 02/20/17  1:37 PM  Result  Value Ref Range Status   Specimen Description BLOOD LEFT WRIST  Final   Special Requests BOTTLES DRAWN AEROBIC AND ANAEROBIC 5 CC EA  Final   Culture  Setup Time   Final    GRAM NEGATIVE RODS IN BOTH AEROBIC AND ANAEROBIC BOTTLES CRITICAL RESULT CALLED TO, READ BACK BY AND VERIFIED WITH: M. RENZ PHARMD, AT 1610 02/21/17 BY Renato Shin Performed at [facility], [address], [place], [place] [address]    Culture GRAM NEGATIVE RODS  Final   Report Status PENDING  Incomplete  Blood Culture ID Panel (Reflexed)     Status: Abnormal   Collection Time: 02/20/17  1:37 PM  Result Value Ref Range Status   Enterococcus species NOT DETECTED NOT DETECTED Final   Listeria monocytogenes NOT DETECTED NOT DETECTED Final   Staphylococcus species NOT DETECTED NOT DETECTED Final   Staphylococcus aureus NOT DETECTED  NOT DETECTED Final   Streptococcus species NOT DETECTED NOT DETECTED Final   Streptococcus agalactiae NOT DETECTED NOT DETECTED Final   Streptococcus pneumoniae NOT DETECTED NOT DETECTED Final   Streptococcus pyogenes NOT DETECTED NOT DETECTED Final   Acinetobacter baumannii NOT DETECTED NOT DETECTED Final   Enterobacteriaceae species DETECTED (A) NOT DETECTED Final    Comment: Enterobacteriaceae represent a large family of gram-negative bacteria, not a single organism. CRITICAL RESULT CALLED TO, READ BACK BY AND VERIFIED WITH: M. RENZ PHARMD, AT 1610 02/21/17 BY D. VANHOOK    Enterobacter cloacae complex NOT DETECTED NOT DETECTED Final   Escherichia coli DETECTED (A) NOT DETECTED Final    Comment: CRITICAL RESULT CALLED TO, READ BACK BY AND VERIFIED WITH: M. RENZ PHARMD, AT 9604 02/21/17 BY D. VANHOOK    Klebsiella oxytoca NOT DETECTED NOT DETECTED Final   Klebsiella pneumoniae NOT DETECTED NOT DETECTED Final   Proteus species NOT DETECTED NOT DETECTED Final   Serratia marcescens NOT DETECTED NOT DETECTED Final   Carbapenem resistance NOT DETECTED NOT DETECTED Final   Haemophilus  influenzae NOT DETECTED NOT DETECTED Final   Neisseria meningitidis NOT DETECTED NOT DETECTED Final   Pseudomonas aeruginosa NOT DETECTED NOT DETECTED Final   Candida albicans NOT DETECTED NOT DETECTED Final   Candida glabrata NOT DETECTED NOT DETECTED Final   Candida krusei NOT DETECTED NOT DETECTED Final   Candida parapsilosis NOT DETECTED NOT DETECTED Final   Candida tropicalis NOT DETECTED NOT DETECTED Final    Comment: Performed at [facility], [address], [place], [place] [address]  Blood Culture (routine x 2)     Status: None (Preliminary result)   Collection Time: 02/20/17  1:57 PM  Result Value Ref Range Status   Specimen Description BLOOD BLOOD RIGHT FOREARM  Final   Special Requests BOTTLES DRAWN AEROBIC AND ANAEROBIC 5 CC EA  Final   Culture  Setup Time   Final    GRAM NEGATIVE RODS IN BOTH AEROBIC AND ANAEROBIC BOTTLES CRITICAL VALUE NOTED.  VALUE IS CONSISTENT WITH PREVIOUSLY REPORTED AND CALLED VALUE. Performed at [facility], [address] [place], [place] [address]    Culture GRAM NEGATIVE RODS  Final   Report Status PENDING  Incomplete  MRSA PCR Screening     Status: Abnormal   Collection Time: 02/20/17  6:00 PM  Result Value Ref Range Status   MRSA by PCR POSITIVE (A) NEGATIVE Final    Comment:        The GeneXpert MRSA Assay (FDA approved for NASAL specimens only), is one component of a comprehensive MRSA colonization surveillance program. It is not intended to diagnose MRSA infection nor to guide or monitor treatment for MRSA infections. RESULT CALLED TO, READ BACK BY AND VERIFIED WITH: ARMSTRONG,S AT 1959 ON 02/20/2017 BY MOSLEY,J      Scheduled Meds: . aspirin EC  81 mg Oral Daily  . atenolol  12.5 mg Oral Daily  . chlorhexidine  15 mL Mouth Rinse BID  . Chlorhexidine Gluconate Cloth  6 each Topical Q0600  . feeding supplement (ENSURE ENLIVE)  237 mL Oral BID BM  . heparin subcutaneous  5,000 Units Subcutaneous Q8H  . mouth  rinse  15 mL Mouth Rinse q12n4p  . mupirocin ointment  1 application Nasal BID  . pantoprazole  40 mg Oral Daily  . piperacillin-tazobactam (ZOSYN)  IV  3.375 g Intravenous Q8H  . risperiDONE  0.5 mg Oral BID  . tamsulosin  0.4 mg Oral  Daily   Continuous Infusions:  Procedures/Studies: [place] Renal  Result Date: 01/30/2017 CLINICAL DATA:  Acute kidney injury. EXAM: RENAL / URINARY TRACT ULTRASOUND COMPLETE COMPARISON:  None. FINDINGS: Right Kidney: Length: 11.2 cm. Echogenicity is increased. Moderate hydronephrosis visualized. Left Kidney: Length: 10.8. Echogenicity is increased. Moderate hydronephrosis visualized. Bladder: Irregular, trabeculated appearance of the bladder wall is seen. IMPRESSION: Moderate bilateral hydronephrosis. Increased cortical echogenicity bilaterally consistent with medical renal disease. Trabeculated appearance of the urinary bladder wall suggestive of bladder outlet obstruction. Electronically Signed   By: Drusilla Kannerhomas  Dalessio M.D.   On: 01/30/2017 09:26   Dg Chest Port 1 View  Result Date: 02/20/2017 CLINICAL DATA:  Cough. Altered mental status for 1 day. Leukocytosis. EXAM: PORTABLE CHEST 1 VIEW COMPARISON:  None. FINDINGS: Hazy opacity of the right more than left bases, primarily concerning for pneumonia in this setting. No edema, effusion, or pneumothorax. Normal heart size and mediastinal contours. IMPRESSION: Opacities at the right greater than left bases primarily concerning for pneumonia or aspiration in this setting. Electronically Signed   By: Marnee SpringJonathon  Watts M.D.   On: 02/20/2017 14:15    Dylanie Quesenberry, DO  [facility] Pager [phone]  If 7PM-7AM, please contact night-coverage [email or web address] Password [email or web address] 02/21/2017, 9:18 AM   LOS: 1 day

## 2017-02-21 NOTE — Consult Note (Addendum)
WOC Nurse wound consult note Reason for Consult: Multiple wounds.  Pressure injury and chronic nonhealing full thickness wound to left plantar heel.  Albumin is currently 2.3.  Frail, bony prominences notable.  Wound type:Pressure and chronic nonhealing Pressure Injury POA: Yes Measurement: Left plantar heel with chronic full thickness wound; 1X3X.6cm, 70% red, 30% yellow, small amt tan drainage, no odor or fluctuance.  Left lateral heel with 1.5 cm x 2 cm maroon discoloration.  Intact, deep tissue injury Right lateral heel with 2 cm x 2 cm deep tissue injury Right upper back, near scapula  2 cm x 4 cm intact erythematous, nonblanchable, not maroon but deep red and considered deep tissue injury, present on admission Upper mid back 2 cm x2cm intact maroon discoloration deep tissue injury Bilateral buttocks with moisture and pressure damage.  Deep tissue injury noted in center with surrounding erythema.  Maroon, deep injury is 4 cm x 2 cm intact and present on bilateral gluteal folds.  Erythema extends 8 cm x 3 cm on each gluteal fold.  Patient expresses pain with repositioning.   Wound ONG:EXBMWUbed:maroon intact Drainage (amount, consistency, odor) minimal serosanguinous to left plantar heel wound.  Periwound:Intact Dressing procedure/placement/frequency:Cleanse left heel with NS and pat gently dry.  Apply NS moist gauze, cover with silicone border foam dressing.  Peel back and change NS gauze daily and change heel foam every three days and PRN soilage.  Cleanse buttocks wound with soap and water and pat gently dry. Apply moisture barrier .  In an effort to wick moisture, will implement silicone border foam dressing, to extend further down buttocks.  Change promptly if soiled.  Keep skin clean and dry.  If transferred to medical floor, will need sizewise alternate bed with low air loss feature.  Will not follow at this time.  Please re-consult if needed.  Maple HudsonKaren Fumio Vandam RN BSN CWON Pager [phone]

## 2017-02-21 NOTE — Progress Notes (Signed)
Pharmacy Antibiotic Note  Scott Park is a 83 y.o. male who was transported from [facility] to ED on 02/20/2017 with AMS and met criteria for Code Sepsis.   Patient has AKI, likely postrenal from BOO (Foley was malfunctioning, replaced in ED) with SCr 5.0 (baseline value 1.2), est CrCl ~ 12 mL/min.  MD suspects urosepsis and also notes CXR c/w pneumonia.  Orders received to begin empiric Zosyn and vancomycin with pharmacy dosing assistance.  02/21/2017 UOP and SCr improving. Although estimated CrCl 16 mL/min by C-G, suggest actual value is > 20 mL/min with renal function improving. Blood culture growing GNR in 2 of 2 sets (reflexed to BCID, in process) (+) MRSA PCR nasal screen noted Vancomycin random level 11 after 1 gram yesterday at 1415   Plan: Increase Zosyn to extended-infusion regimen (3.375 grams IV q8h, each dose over 4 hours) Vancomycin 1 gram IV x 1 this AM, recheck random level tomorrow AM Serum creatinine daily Follow up on blood culture results, de-escalate when feasible.  Height: 5' 9"  (175.3 cm) Weight: 151 lb 14.4 oz (68.9 kg) IBW/kg (Calculated) : 70.7  Temp (24hrs), Avg:98.5 F (36.9 C), Min:97.3 F (36.3 C), Max:100.6 F (38.1 C)   Recent Labs Lab 02/20/17 1337 02/20/17 1343 02/20/17 1406 02/20/17 1836 02/20/17 2226 02/21/17 0427 02/21/17 0440  WBC 26.0*  --   --   --   --  25.0*  --   CREATININE 4.97*  --  5.00*  --   --  3.48*  --   LATICACIDVEN  --  2.04*  --  1.3 1.8  --   --   VANCORANDOM  --   --   --   --   --   --  11    Estimated Creatinine Clearance: 16.2 mL/min (by C-G formula based on SCr of 3.48 mg/dL (H)).    Allergies  Allergen Reactions  . Codeine Anxiety  . Hydroxyzine Other (See Comments)    Makes things worse  . Seroquel [Quetiapine] Other (See Comments)    Makes things worse   Dose adjustments this admission: 3/1 vanco 1 g x1 @ 1415, level in AM 3/2 vanco random level 11 - 1 g x1 @1000 , level in AM   Microbiology results: 3/1 BCx:  GNR in 2 of 2 sets (reflexed to BCID, IP) 3/1 MRSA PCR: (+) 3/1 UCx: collected      Thank you for allowing pharmacy to be a part of this patient's care.  Clayburn Pert, PharmD, BCPS Pager: [phone] 02/21/2017  7:54 AM

## 2017-02-21 NOTE — Progress Notes (Signed)
PHARMACY - PHYSICIAN COMMUNICATION CRITICAL VALUE ALERT - BLOOD CULTURE IDENTIFICATION (BCID)  Results for orders placed or performed during the hospital encounter of 02/20/17  Blood Culture ID Panel (Reflexed) (Collected: 02/20/2017  1:37 PM)  Result Value Ref Range   Enterococcus species NOT DETECTED NOT DETECTED   Listeria monocytogenes NOT DETECTED NOT DETECTED   Staphylococcus species NOT DETECTED NOT DETECTED   Staphylococcus aureus NOT DETECTED NOT DETECTED   Streptococcus species NOT DETECTED NOT DETECTED   Streptococcus agalactiae NOT DETECTED NOT DETECTED   Streptococcus pneumoniae NOT DETECTED NOT DETECTED   Streptococcus pyogenes NOT DETECTED NOT DETECTED   Acinetobacter baumannii NOT DETECTED NOT DETECTED   Enterobacteriaceae species DETECTED (A) NOT DETECTED   Enterobacter cloacae complex NOT DETECTED NOT DETECTED   Escherichia coli DETECTED (A) NOT DETECTED   Klebsiella oxytoca NOT DETECTED NOT DETECTED   Klebsiella pneumoniae NOT DETECTED NOT DETECTED   Proteus species NOT DETECTED NOT DETECTED   Serratia marcescens NOT DETECTED NOT DETECTED   Carbapenem resistance NOT DETECTED NOT DETECTED   Haemophilus influenzae NOT DETECTED NOT DETECTED   Neisseria meningitidis NOT DETECTED NOT DETECTED   Pseudomonas aeruginosa NOT DETECTED NOT DETECTED   Candida albicans NOT DETECTED NOT DETECTED   Candida glabrata NOT DETECTED NOT DETECTED   Candida krusei NOT DETECTED NOT DETECTED   Candida parapsilosis NOT DETECTED NOT DETECTED   Candida tropicalis NOT DETECTED NOT DETECTED    Name of physician (or Provider) Contacted: Dr. Arbutus Leasat  Changes to prescribed antibiotics:  MD had already seen result and discontinued vancomycin MD has decided to continue Zosyn for now (patient also has pneumonia and is a [place] resident)  Scott Park, PharmD, BCPS Pager: [phone] 02/21/2017  8:43 AM

## 2017-02-22 DIAGNOSIS — E43 Unspecified severe protein-calorie malnutrition: Secondary | ICD-10-CM

## 2017-02-22 DIAGNOSIS — F0281 Dementia in other diseases classified elsewhere with behavioral disturbance: Secondary | ICD-10-CM

## 2017-02-22 DIAGNOSIS — N170 Acute kidney failure with tubular necrosis: Secondary | ICD-10-CM

## 2017-02-22 DIAGNOSIS — E876 Hypokalemia: Secondary | ICD-10-CM

## 2017-02-22 DIAGNOSIS — R000 Tachycardia, unspecified: Secondary | ICD-10-CM

## 2017-02-22 LAB — BASIC METABOLIC PANEL
Anion gap: 9 (ref 5–15)
BUN: 81 mg/dL — AB (ref 6–20)
CALCIUM: 8.3 mg/dL — AB (ref 8.9–10.3)
CO2: 22 mmol/L (ref 22–32)
CREATININE: 2.14 mg/dL — AB (ref 0.61–1.24)
Chloride: 123 mmol/L — ABNORMAL HIGH (ref 101–111)
GFR calc non Af Amer: 27 mL/min — ABNORMAL LOW (ref >60)
GFR, EST AFRICAN AMERICAN: 32 mL/min — AB (ref >60)
Glucose, Bld: 110 mg/dL — ABNORMAL HIGH (ref 65–99)
Potassium: 3.1 mmol/L — ABNORMAL LOW (ref 3.5–5.1)
Sodium: 154 mmol/L — ABNORMAL HIGH (ref 135–145)

## 2017-02-22 LAB — CBC
HCT: 29 % — ABNORMAL LOW (ref 39.0–52.0)
Hemoglobin: 9.1 g/dL — ABNORMAL LOW (ref 13.0–17.0)
MCH: 24.2 pg — ABNORMAL LOW (ref 26.0–34.0)
MCHC: 31.4 g/dL (ref 30.0–36.0)
MCV: 77.1 fL — ABNORMAL LOW (ref 78.0–100.0)
Platelets: 206 10*3/uL (ref 150–400)
RBC: 3.76 MIL/uL — ABNORMAL LOW (ref 4.22–5.81)
RDW: 16.4 % — ABNORMAL HIGH (ref 11.5–15.5)
WBC: 22 10*3/uL — ABNORMAL HIGH (ref 4.0–10.5)

## 2017-02-22 LAB — MAGNESIUM: Magnesium: 2.1 mg/dL (ref 1.7–2.4)

## 2017-02-22 MED ORDER — SODIUM CHLORIDE 0.9 % IV SOLN
30.0000 meq | Freq: Once | INTRAVENOUS | Status: AC
  Administered 2017-02-22: 30 meq via INTRAVENOUS
  Filled 2017-02-22: qty 15

## 2017-02-22 MED ORDER — DEXTROSE 5 % IV SOLN
INTRAVENOUS | Status: AC
  Administered 2017-02-22 (×2): via INTRAVENOUS

## 2017-02-22 MED ORDER — DILTIAZEM HCL 25 MG/5ML IV SOLN
10.0000 mg | Freq: Four times a day (QID) | INTRAVENOUS | Status: DC | PRN
  Filled 2017-02-22: qty 5

## 2017-02-22 NOTE — Progress Notes (Signed)
Pt has moments where his HR will increase into the 140's, but does not sustain.  HR will increase and then return to baseline of 90's-100's moments following; it has happened on 2 separate occasions this shift.  MD is aware.  Will continue to monitor.

## 2017-02-22 NOTE — Progress Notes (Signed)
SLP Cancellation Note  Patient Details Name: Scott Park MRN: [id number] DOB: 02/10/1933   Cancelled treatment:       Reason Eval/Treat Not Completed: Patient's level of consciousness. Spoke with patient's RN who stated that patient does not follow commands, will open eyes briefly but continues to not be alert enough for PO.'s. Will continue to follow patient for readiness for swallow evaluation.  Angela NevinJohn T. Kien Mirsky, MA, CCC-SLP Speech Therapy

## 2017-02-22 NOTE — Progress Notes (Signed)
PROGRESS NOTE                                                                                                                                                                                                             Patient Demographics:    Scott Park, is a 83 y.o. male, DOB - [birth year], ZOX:[id number]  Admit date - 02/20/2017   Admitting Physician Richarda Overlie, MD  Outpatient Primary MD for the patient is Pete Glatter, MD  LOS - 2  Outpatient Specialists:None  Chief Complaint  Patient presents with  . Altered Mental Status       Brief Narrative   83 year old male with dementia, hypertension, chronic bladder outlet obstruction on Foley presented from SNF with altered mental status with increasing confusion for the past 2 days prior to admission. Blood will done showed significant leukocytosis and patient was given IM ceftriaxone. Since his symptoms worsened was sent to the ED. In the ED his WBC was 20 6K with severe acute kidney injury (creatinine of 4.97) with >1 L urine on bladder scan. Foley excision done with purulent  urine . Admitted to stepdown unit on IV fluids and IV antibiotics.   Subjective:   Awake to commands but poorly communicative. episodes of HR upto 140s overnight, self limited.   Assessment  & Plan :    Principal Problem:   Sepsis (HCC) Secondary to combination of Escherichia coli/Enterobacter UTI with bacteremia and healthcare associated pneumonia. On empiric Zosyn. Final culture  and sensitivity still pending. Mental status does not appear to have improved much.  Active Problems: Acute metabolic encephalopathy Secondary to sepsis and dehydration. Has underlying dementia with worsened symptoms and increased somnolence. Continue to monitor with hydration and antibiotics. Avoid narcotics or benzos.  Healthcare associated pneumonia/aspiration pneumonia Empiric antibiotics. SLP  evaluation.       AKI (acute kidney injury) (HCC) Secondary to bladder outlet obstruction. Renal function slowly improving after Foley exchange in the ED and IV fluids. Continue to monitor. Avoid nephrotoxins  Hypokalemia Replenished with IV potassium.   Hypernatremia Fluid switch to D5W.  HTN (hypertension), benign BP elevated this morning with short bursts of sinus cardia up to 140s, self-limiting. Ordered when necessary IV Cardizem.    Dementia with behavioral disturbance Haldol when necessary for agitation. Holding this pedal since patient is nothing by mouth.  Protein calorie malnutrition Nutrition consult once able to take by mouth.  Sacral and pressure left heel decubitus ulcer Wound care consult appreciated        Code Status : None  Family Communication  : Unable to reach son on the phone.  Disposition Plan  : transfer to tele. Pall care consult for GOC if some of these. Overall prognosis is guarded.  Barriers For Discharge : Active symptoms  Consults  :  None  Procedures  : None  DVT Prophylaxis  :sq  Heparin  Lab Results  Component Value Date   PLT 206 02/22/2017    Antibiotics  :    Anti-infectives    Start     Dose/Rate Route Frequency Ordered Stop   02/21/17 1200  piperacillin-tazobactam (ZOSYN) IVPB 3.375 g     3.375 g 12.5 mL/hr over 240 Minutes Intravenous Every 8 hours 02/21/17 0754     02/21/17 1000  vancomycin (VANCOCIN) IVPB 1000 mg/200 mL premix  Status:  Discontinued     1,000 mg 200 mL/hr over 60 Minutes Intravenous  Once 02/21/17 0754 02/21/17 0836   02/20/17 2200  piperacillin-tazobactam (ZOSYN) IVPB 2.25 g  Status:  Discontinued     2.25 g 100 mL/hr over 30 Minutes Intravenous Every 8 hours 02/20/17 1501 02/21/17 0754   02/20/17 1345  piperacillin-tazobactam (ZOSYN) IVPB 3.375 g     3.375 g 100 mL/hr over 30 Minutes Intravenous  Once 02/20/17 1343 02/20/17 1554   02/20/17 1345  vancomycin (VANCOCIN) IVPB 1000 mg/200 mL  premix     1,000 mg 200 mL/hr over 60 Minutes Intravenous  Once 02/20/17 1343 02/20/17 1555        Objective:   Vitals:   02/22/17 0200 02/22/17 0400 02/22/17 0600 02/22/17 0800  BP: (!) 158/75 (!) 146/55 (!) 155/63 (!) 148/67  Pulse: (!) 110 (!) 105 (!) 111 93  Resp: (!) 26 20 (!) 22 (!) 23  Temp: 99.9 F (37.7 C) 99.7 F (37.6 C) 99.1 F (37.3 C) 99 F (37.2 C)  TempSrc:    Core (Comment)  SpO2: 100% 100% 100% 100%  Weight:      Height:        Wt Readings from Last 3 Encounters:  02/20/17 68.9 kg (151 lb 14.4 oz)  02/20/17 72.1 kg (159 lb)  02/19/17 82.6 kg (182 lb)     Intake/Output Summary (Last 24 hours) at 02/22/17 0913 Last data filed at 02/22/17 0800  Gross per 24 hour  Intake          2092.91 ml  Output             2315 ml  Net          -222.09 ml     Physical Exam  Gen: elderly frail male, sleepy, arousable to commands HEENT: no pallor, dry mucosa, temporal wating, poor dentition,  supple neck Chest: clear b/l, no added sounds CVS: N S1&S2, no murmurs, rubs or gallop GI: soft, NT, ND, BS+, indwelling foley+ Musculoskeletal: warm, no edema, left plantar pressure ulcer CNS: AAOX0, poorly communicative    Data Review:    CBC  Recent Labs Lab 02/20/17 1337 02/20/17 1406 02/21/17 0427 02/22/17 0341  WBC 26.0*  --  25.0* 22.0*  HGB 8.0* 18.4* 9.2* 9.1*  HCT 25.6* 54.0* 29.8* 29.0*  PLT 340  --  231 206  MCV 75.1*  --  76.8* 77.1*  MCH 23.5*  --  23.7* 24.2*  MCHC 31.3  --  30.9  31.4  RDW 16.1*  --  16.2* 16.4*  LYMPHSABS 0.8  --   --   --   MONOABS 0.8  --   --   --   EOSABS 0.0  --   --   --   BASOSABS 0.0  --   --   --     Chemistries   Recent Labs Lab 02/20/17 1337 02/20/17 1406 02/21/17 0427 02/22/17 0334 02/22/17 0341  NA 147* 148* 152*  --  154*  K 4.6 4.5 4.0  --  3.1*  CL 112* 111 118*  --  123*  CO2 20*  --  21*  --  22  GLUCOSE 146* 143* 124*  --  110*  BUN 100* 89* 88*  --  81*  CREATININE 4.97* 5.00* 3.48*  --   2.14*  CALCIUM 8.5*  --  8.4*  --  8.3*  MG 2.2  --   --  2.1  --   AST 45*  --  36  --   --   ALT 24  --  25  --   --   ALKPHOS 32*  --  31*  --   --   BILITOT 0.8  --  0.7  --   --    ------------------------------------------------------------------------------------------------------------------ No results for input(s): CHOL, HDL, LDLCALC, TRIG, CHOLHDL, LDLDIRECT in the last 72 hours.  Lab Results  Component Value Date   HGBA1C 5.3 02/20/2017   ------------------------------------------------------------------------------------------------------------------ No results for input(s): TSH, T4TOTAL, T3FREE, THYROIDAB in the last 72 hours.  Invalid input(s): FREET3 ------------------------------------------------------------------------------------------------------------------ No results for input(s): VITAMINB12, FOLATE, FERRITIN, TIBC, IRON, RETICCTPCT in the last 72 hours.  Coagulation profile  Recent Labs Lab 02/20/17 1337  INR 1.39    No results for input(s): DDIMER in the last 72 hours.  Cardiac Enzymes  Recent Labs Lab 02/20/17 1337 02/20/17 2226 02/21/17 0427  TROPONINI 0.19* 0.10* 0.09*   ------------------------------------------------------------------------------------------------------------------    Component Value Date/Time   BNP 149.5 (H) 11/25/2016 1508    Inpatient Medications  Scheduled Meds: . chlorhexidine  15 mL Mouth Rinse BID  . Chlorhexidine Gluconate Cloth  6 each Topical Q0600  . feeding supplement (ENSURE ENLIVE)  237 mL Oral BID BM  . heparin subcutaneous  5,000 Units Subcutaneous Q8H  . mouth rinse  15 mL Mouth Rinse q12n4p  . mupirocin ointment  1 application Nasal BID  . pantoprazole  40 mg Oral Daily  . piperacillin-tazobactam (ZOSYN)  IV  3.375 g Intravenous Q8H  . potassium chloride (KCL MULTIRUN) 30 mEq in 265 mL IVPB  30 mEq Intravenous Once  . risperiDONE  0.5 mg Oral BID  . tamsulosin  0.4 mg Oral Daily   Continuous  Infusions: . dextrose 75 mL/hr at 02/22/17 0800   PRN Meds:.acetaminophen **OR** acetaminophen, haloperidol lactate, hydrALAZINE, levalbuterol, ondansetron **OR** ondansetron (ZOFRAN) IV  Micro Results Recent Results (from the past 240 hour(s))  Blood Culture (routine x 2)     Status: None (Preliminary result)   Collection Time: 02/20/17  1:37 PM  Result Value Ref Range Status   Specimen Description BLOOD LEFT WRIST  Final   Special Requests BOTTLES DRAWN AEROBIC AND ANAEROBIC 5 CC EA  Final   Culture  Setup Time   Final    GRAM NEGATIVE RODS IN BOTH AEROBIC AND ANAEROBIC BOTTLES CRITICAL RESULT CALLED TO, READ BACK BY AND VERIFIED WITH: M. RENZ PHARMD, AT [id number] 02/21/17 BY Renato Shin. VANHOOK Performed at [facility], [address] [place],  [place] [address]    Culture GRAM NEGATIVE RODS  Final   Report Status PENDING  Incomplete  Blood Culture ID Panel (Reflexed)     Status: Abnormal   Collection Time: 02/20/17  1:37 PM  Result Value Ref Range Status   Enterococcus species NOT DETECTED NOT DETECTED Final   Listeria monocytogenes NOT DETECTED NOT DETECTED Final   Staphylococcus species NOT DETECTED NOT DETECTED Final   Staphylococcus aureus NOT DETECTED NOT DETECTED Final   Streptococcus species NOT DETECTED NOT DETECTED Final   Streptococcus agalactiae NOT DETECTED NOT DETECTED Final   Streptococcus pneumoniae NOT DETECTED NOT DETECTED Final   Streptococcus pyogenes NOT DETECTED NOT DETECTED Final   Acinetobacter baumannii NOT DETECTED NOT DETECTED Final   Enterobacteriaceae species DETECTED (A) NOT DETECTED Final    Comment: Enterobacteriaceae represent a large family of gram-negative bacteria, not a single organism. CRITICAL RESULT CALLED TO, READ BACK BY AND VERIFIED WITH: M. RENZ PHARMD, AT 6045 02/21/17 BY D. VANHOOK    Enterobacter cloacae complex NOT DETECTED NOT DETECTED Final   Escherichia coli DETECTED (A) NOT DETECTED Final    Comment: CRITICAL RESULT CALLED TO, READ  BACK BY AND VERIFIED WITH: M. RENZ PHARMD, AT 4098 02/21/17 BY D. VANHOOK    Klebsiella oxytoca NOT DETECTED NOT DETECTED Final   Klebsiella pneumoniae NOT DETECTED NOT DETECTED Final   Proteus species NOT DETECTED NOT DETECTED Final   Serratia marcescens NOT DETECTED NOT DETECTED Final   Carbapenem resistance NOT DETECTED NOT DETECTED Final   Haemophilus influenzae NOT DETECTED NOT DETECTED Final   Neisseria meningitidis NOT DETECTED NOT DETECTED Final   Pseudomonas aeruginosa NOT DETECTED NOT DETECTED Final   Candida albicans NOT DETECTED NOT DETECTED Final   Candida glabrata NOT DETECTED NOT DETECTED Final   Candida krusei NOT DETECTED NOT DETECTED Final   Candida parapsilosis NOT DETECTED NOT DETECTED Final   Candida tropicalis NOT DETECTED NOT DETECTED Final    Comment: Performed at [facility], [address] [place], [place] [address]  Blood Culture (routine x 2)     Status: None (Preliminary result)   Collection Time: 02/20/17  1:57 PM  Result Value Ref Range Status   Specimen Description BLOOD BLOOD RIGHT FOREARM  Final   Special Requests BOTTLES DRAWN AEROBIC AND ANAEROBIC 5 CC EA  Final   Culture  Setup Time   Final    GRAM NEGATIVE RODS IN BOTH AEROBIC AND ANAEROBIC BOTTLES CRITICAL VALUE NOTED.  VALUE IS CONSISTENT WITH PREVIOUSLY REPORTED AND CALLED VALUE. Performed at [facility], [address] [place], [place] [address]    Culture GRAM NEGATIVE RODS  Final   Report Status PENDING  Incomplete  MRSA PCR Screening     Status: Abnormal   Collection Time: 02/20/17  6:00 PM  Result Value Ref Range Status   MRSA by PCR POSITIVE (A) NEGATIVE Final    Comment:        The GeneXpert MRSA Assay (FDA approved for NASAL specimens only), is one component of a comprehensive MRSA colonization surveillance program. It is not intended to diagnose MRSA infection nor to guide or monitor treatment for MRSA infections. RESULT CALLED TO, READ BACK BY AND VERIFIED  WITH: ARMSTRONG,S AT 1959 ON 02/20/2017 BY MOSLEY,J   Urine culture     Status: Abnormal (Preliminary result)   Collection Time: 02/20/17  9:30 PM  Result Value Ref Range Status   Specimen Description URINE, CLEAN CATCH  Final   Special Requests  Normal  Final   Culture >=100,000 COLONIES/mL GRAM NEGATIVE RODS (A)  Final   Report Status PENDING  Incomplete    Radiology Reports US Renal  Result Date: 01/30/2017 CLINICAL DATA:  Acute kidney injury. EXAM: RENAL / URINARY TRACT ULTRASOUND COMPLETE COMPARISON:  None. FINDINGS: Right Kidney: Length: 11.2 cm. Echogenicity is increased. Moderate hydronephrosis visualized. Left Kidney: Length: 10.8. Echogenicity is increased. Moderate hydronephrosis visualized. Bladder: Irregular, trabeculated appearance of the bladder wall is seen. IMPRESSION: Moderate bilateral hydronephrosis. Increased cortical echogenicity bilaterally consistent with medical renal disease. Trabeculated appearance of the urinary bladder wall suggestive of bladder outlet obstruction. Electronically Signed   By: Drusilla Kanner M.D.   On: 01/30/2017 09:26   Dg Chest Port 1 View  Result Date: 02/20/2017 CLINICAL DATA:  Cough. Altered mental status for 1 day. Leukocytosis. EXAM: PORTABLE CHEST 1 VIEW COMPARISON:  None. FINDINGS: Hazy opacity of the right more than left bases, primarily concerning for pneumonia in this setting. No edema, effusion, or pneumothorax. Normal heart size and mediastinal contours. IMPRESSION: Opacities at the right greater than left bases primarily concerning for pneumonia or aspiration in this setting. Electronically Signed   By: Marnee Spring M.D.   On: 02/20/2017 14:15    Time Spent in minutes  35   Eddie North M.D on 02/22/2017 at 9:13 AM  Between 7am to 7pm - Pager - [phone]  After 7pm go to [email or web address] - password [facility]  [facility] -  Office  [phone]

## 2017-02-23 DIAGNOSIS — J690 Pneumonitis due to inhalation of food and vomit: Secondary | ICD-10-CM

## 2017-02-23 LAB — CBC
HCT: 28.4 % — ABNORMAL LOW (ref 39.0–52.0)
Hemoglobin: 8.6 g/dL — ABNORMAL LOW (ref 13.0–17.0)
MCH: 23.8 pg — AB (ref 26.0–34.0)
MCHC: 30.3 g/dL (ref 30.0–36.0)
MCV: 78.7 fL (ref 78.0–100.0)
PLATELETS: 192 10*3/uL (ref 150–400)
RBC: 3.61 MIL/uL — AB (ref 4.22–5.81)
RDW: 16.5 % — AB (ref 11.5–15.5)
WBC: 14.2 10*3/uL — ABNORMAL HIGH (ref 4.0–10.5)

## 2017-02-23 LAB — CULTURE, BLOOD (ROUTINE X 2)

## 2017-02-23 LAB — BASIC METABOLIC PANEL
Anion gap: 5 (ref 5–15)
BUN: 55 mg/dL — AB (ref 6–20)
CHLORIDE: 128 mmol/L — AB (ref 101–111)
CO2: 26 mmol/L (ref 22–32)
CREATININE: 1.43 mg/dL — AB (ref 0.61–1.24)
Calcium: 8.1 mg/dL — ABNORMAL LOW (ref 8.9–10.3)
GFR calc Af Amer: 51 mL/min — ABNORMAL LOW (ref >60)
GFR calc non Af Amer: 44 mL/min — ABNORMAL LOW (ref >60)
Glucose, Bld: 124 mg/dL — ABNORMAL HIGH (ref 65–99)
POTASSIUM: 3.2 mmol/L — AB (ref 3.5–5.1)
Sodium: 159 mmol/L — ABNORMAL HIGH (ref 135–145)

## 2017-02-23 LAB — URINE CULTURE: SPECIAL REQUESTS: NORMAL

## 2017-02-23 LAB — CREATININE, SERUM
CREATININE: 1.5 mg/dL — AB (ref 0.61–1.24)
GFR calc Af Amer: 49 mL/min — ABNORMAL LOW (ref >60)
GFR calc non Af Amer: 42 mL/min — ABNORMAL LOW (ref >60)

## 2017-02-23 MED ORDER — POTASSIUM CHLORIDE 2 MEQ/ML IV SOLN: INTRAVENOUS | Status: DC

## 2017-02-23 MED ORDER — KCL IN DEXTROSE-NACL 20-5-0.45 MEQ/L-%-% IV SOLN
INTRAVENOUS | Status: DC
  Administered 2017-02-23 – 2017-02-24 (×2): via INTRAVENOUS
  Filled 2017-02-23 (×3): qty 1000

## 2017-02-23 MED ORDER — SODIUM CHLORIDE 0.9 % IV SOLN
30.0000 meq | Freq: Once | INTRAVENOUS | Status: AC
  Administered 2017-02-23: 30 meq via INTRAVENOUS
  Filled 2017-02-23: qty 15

## 2017-02-23 MED ORDER — MEROPENEM 1 G IV SOLR
1.0000 g | Freq: Two times a day (BID) | INTRAVENOUS | Status: DC
  Administered 2017-02-23 – 2017-02-26 (×6): 1 g via INTRAVENOUS
  Filled 2017-02-23 (×8): qty 1

## 2017-02-23 NOTE — Progress Notes (Signed)
PROGRESS NOTE  [place] ZOX:[id number] DOB: 01-30-1933 DOA: 02/20/2017 PCP: Pete Glatter, MD  Brief History:  83 year old male with a history of dementia, hypertension, chronic bladder outlet obstruction presenting with altered mental status from his SNF.  The patient is unable to provide any significant history. All of this history is obtained from review of his medical record. Apparently, the patient was noted to have increasing confusion over the past 2 days prior to admission by nursing staff at his SNF. Although the patient has dementia, the patient is normally alert and interactive. Blood work at [facility] noted leukocytosis. Blood cultures were obtained and the patient was given IM ceftriaxone. However, the patient continued to be less responsive. As result, he presented to emergency department for further evaluation. In the ED, the patient was noted to have WBC 26.0 with serum creatinine 4.97. Patient was notedto have large bladder on exam with bladder scan >1L - foley exchanged with drainage of purulent, yellow urine.  The patient was started on IV fluids and intravenous antibiotics after cultures were obtained.  Assessment/Plan: Sepsis -Secondary to bacteremia, HCAP and UTI -Continue Zosyn pending culture data -Lactic acid 2.04>>> 1.3 -Urinalysis TNTC WBC -Chest x-ray R>L basilar opacities -blood culture = E.coli  Bacteremia--E. Coli -blood culture = E. Coli -source is urine -Discontinue vancomycin -Continue Zosyn   HCAP -concerned about a component of aspiration -continue zosyn -speech therapy eval once more alert  UTI with indwelling foley catheter -foley from SNF clogged, not draining, replaced in ED -continue zosyn   Acute on chronic renal failure--CKD stage III -Baseline creatinine 1.2-1.3 -Continue IV fluids -Presenting creatinine 4.97  Hypernatremia -Start hypotonic fluid  Acute encephalopathy -Secondary to acute on chronic renal failure  and infectious process  Chronic bladder outlet obstruction -Foley catheter was exchaged in the emergency department this admission -continue Flomax  Dementia with behavior disturbance -Risperdal bid if able to take po -haldol prn agitation  Hypertension -Holding atenolol secondary to soft blood pressure  Sacral and heel decubiti -Present at the time of admission -Wound care nurse consult appreciated   Protein calorie malnutrition Nutrition consult once able to take by mouth.     Disposition Plan:   SNF vs residential in 2-3 days; if no significant improvement in 48 hrs-->comfort care Family Communication:  Son updated at bedside--Total time spent 35 minutes.  Greater than 50% spent face to face counseling and coordinating care.   Consultants:  none  Code Status:  FULL  DVT Prophylaxis:  [place] Heparin   Procedures: As Listed in Progress Note Above  Antibiotics: Vancomycin 3/1>>>3/2 Zosyn 3/1>>>     Subjective: Patient is awake, but does not follow commands or answer any questions. No reports of vomiting, respiratory distress, uncontrolled pain. No diarrhea reported.  Objective: Vitals:   02/22/17 1200 02/22/17 1521 02/22/17 2116 02/23/17 0446  BP:  (!) 150/57 (!) 152/75 (!) 146/37  Pulse:  (!) 102 99 92  Resp: (!) 27 19 20 20   Temp:  98.3 F (36.8 C) 99.8 F (37.7 C) 99.4 F (37.4 C)  TempSrc:  Axillary Axillary Oral  SpO2:  93% 95% 96%  Weight:      Height:        Intake/Output Summary (Last 24 hours) at 02/23/17 1210 Last data filed at 02/23/17 1100  Gross per 24 hour  Intake             1450 ml  Output  1301 ml  Net              149 ml   Weight change:  Exam:   General:  Pt is alert, follows commands appropriately, not in acute distress  HEENT: No icterus, No thrush, No neck mass, [place]/AT  Cardiovascular: RRR, S1/S2, no rubs, no gallops  Respiratory: Bibasilar rales. No wheezing.  Abdomen: Soft/+BS, non  tender, non distended, no guarding  Extremities: No edema, No lymphangitis, No petechiae, No rashes, no synovitis   Data Reviewed: I have personally reviewed following labs and imaging studies Basic Metabolic Panel:  Recent Labs Lab 02/20/17 1337 02/20/17 1406 02/21/17 0427 02/22/17 0334 02/22/17 0341 02/23/17 0550  NA 147* 148* 152*  --  154*  --   K 4.6 4.5 4.0  --  3.1*  --   CL 112* 111 118*  --  123*  --   CO2 20*  --  21*  --  22  --   GLUCOSE 146* 143* 124*  --  110*  --   BUN 100* 89* 88*  --  81*  --   CREATININE 4.97* 5.00* 3.48*  --  2.14* 1.50*  CALCIUM 8.5*  --  8.4*  --  8.3*  --   MG 2.2  --   --  2.1  --   --    Liver Function Tests:  Recent Labs Lab 02/20/17 1337 02/21/17 0427  AST 45* 36  ALT 24 25  ALKPHOS 32* 31*  BILITOT 0.8 0.7  PROT 6.1* 5.9*  ALBUMIN 2.5* 2.3*   No results for input(s): LIPASE, AMYLASE in the last 168 hours. No results for input(s): AMMONIA in the last 168 hours. Coagulation Profile:  Recent Labs Lab 02/20/17 1337  INR 1.39   CBC:  Recent Labs Lab 02/20/17 1337 02/20/17 1406 02/21/17 0427 02/22/17 0341  WBC 26.0*  --  25.0* 22.0*  NEUTROABS 24.4*  --   --   --   HGB 8.0* 18.4* 9.2* 9.1*  HCT 25.6* 54.0* 29.8* 29.0*  MCV 75.1*  --  76.8* 77.1*  PLT 340  --  231 206   Cardiac Enzymes:  Recent Labs Lab 02/20/17 1337 02/20/17 2226 02/21/17 0427  TROPONINI 0.19* 0.10* 0.09*   BNP: Invalid input(s): POCBNP CBG: No results for input(s): GLUCAP in the last 168 hours. HbA1C:  Recent Labs  02/20/17 1337  HGBA1C 5.3   Urine analysis:    Component Value Date/Time   COLORURINE YELLOW 02/20/2017 1412   APPEARANCEUR TURBID (A) 02/20/2017 1412   LABSPEC 1.010 02/20/2017 1412   PHURINE 6.0 02/20/2017 1412   GLUCOSEU NEGATIVE 02/20/2017 1412   HGBUR LARGE (A) 02/20/2017 1412   BILIRUBINUR NEGATIVE 02/20/2017 1412   KETONESUR NEGATIVE 02/20/2017 1412   PROTEINUR 100 (A) 02/20/2017 1412   NITRITE  NEGATIVE 02/20/2017 1412   LEUKOCYTESUR LARGE (A) 02/20/2017 1412   Sepsis Labs: @LABRCNTIP (procalcitonin:4,lacticidven:4) ) Recent Results (from the past 240 hour(s))  Blood Culture (routine x 2)     Status: Abnormal   Collection Time: 02/20/17  1:37 PM  Result Value Ref Range Status   Specimen Description BLOOD LEFT WRIST  Final   Special Requests BOTTLES DRAWN AEROBIC AND ANAEROBIC 5 CC EA  Final   Culture  Setup Time   Final    GRAM NEGATIVE RODS IN BOTH AEROBIC AND ANAEROBIC BOTTLES CRITICAL RESULT CALLED TO, READ BACK BY AND VERIFIED WITH: M. RENZ PHARMD, AT 2440 02/21/17 BY D. VANHOOK    Culture (A)  Final    ESCHERICHIA COLI Confirmed Extended Spectrum Beta-Lactamase Producer (ESBL) Performed at [facility], [address], [place], [place] [address]    Report Status 02/23/2017 FINAL  Final   Organism ID, Bacteria ESCHERICHIA COLI  Final      Susceptibility   Escherichia coli - MIC*    AMPICILLIN >=32 RESISTANT Resistant     CEFAZOLIN >=64 RESISTANT Resistant     CEFEPIME RESISTANT Resistant     CEFTAZIDIME RESISTANT Resistant     CEFTRIAXONE >=64 RESISTANT Resistant     CIPROFLOXACIN <=0.25 SENSITIVE Sensitive     GENTAMICIN <=1 SENSITIVE Sensitive     IMIPENEM <=0.25 SENSITIVE Sensitive     TRIMETH/SULFA >=320 RESISTANT Resistant     AMPICILLIN/SULBACTAM >=32 RESISTANT Resistant     PIP/TAZO 8 SENSITIVE Sensitive     Extended ESBL POSITIVE Resistant     * ESCHERICHIA COLI  Blood Culture ID Panel (Reflexed)     Status: Abnormal   Collection Time: 02/20/17  1:37 PM  Result Value Ref Range Status   Enterococcus species NOT DETECTED NOT DETECTED Final   Listeria monocytogenes NOT DETECTED NOT DETECTED Final   Staphylococcus species NOT DETECTED NOT DETECTED Final   Staphylococcus aureus NOT DETECTED NOT DETECTED Final   Streptococcus species NOT DETECTED NOT DETECTED Final   Streptococcus agalactiae NOT DETECTED NOT DETECTED Final   Streptococcus pneumoniae  NOT DETECTED NOT DETECTED Final   Streptococcus pyogenes NOT DETECTED NOT DETECTED Final   Acinetobacter baumannii NOT DETECTED NOT DETECTED Final   Enterobacteriaceae species DETECTED (A) NOT DETECTED Final    Comment: Enterobacteriaceae represent a large family of gram-negative bacteria, not a single organism. CRITICAL RESULT CALLED TO, READ BACK BY AND VERIFIED WITH: M. RENZ PHARMD, AT 6045 02/21/17 BY D. VANHOOK    Enterobacter cloacae complex NOT DETECTED NOT DETECTED Final   Escherichia coli DETECTED (A) NOT DETECTED Final    Comment: CRITICAL RESULT CALLED TO, READ BACK BY AND VERIFIED WITH: M. RENZ PHARMD, AT 4098 02/21/17 BY D. VANHOOK    Klebsiella oxytoca NOT DETECTED NOT DETECTED Final   Klebsiella pneumoniae NOT DETECTED NOT DETECTED Final   Proteus species NOT DETECTED NOT DETECTED Final   Serratia marcescens NOT DETECTED NOT DETECTED Final   Carbapenem resistance NOT DETECTED NOT DETECTED Final   Haemophilus influenzae NOT DETECTED NOT DETECTED Final   Neisseria meningitidis NOT DETECTED NOT DETECTED Final   Pseudomonas aeruginosa NOT DETECTED NOT DETECTED Final   Candida albicans NOT DETECTED NOT DETECTED Final   Candida glabrata NOT DETECTED NOT DETECTED Final   Candida krusei NOT DETECTED NOT DETECTED Final   Candida parapsilosis NOT DETECTED NOT DETECTED Final   Candida tropicalis NOT DETECTED NOT DETECTED Final    Comment: Performed at [facility], [address], [place], [place] [address]  Blood Culture (routine x 2)     Status: Abnormal   Collection Time: 02/20/17  1:57 PM  Result Value Ref Range Status   Specimen Description BLOOD BLOOD RIGHT FOREARM  Final   Special Requests BOTTLES DRAWN AEROBIC AND ANAEROBIC 5 CC EA  Final   Culture  Setup Time   Final    GRAM NEGATIVE RODS IN BOTH AEROBIC AND ANAEROBIC BOTTLES CRITICAL VALUE NOTED.  VALUE IS CONSISTENT WITH PREVIOUSLY REPORTED AND CALLED VALUE.    Culture (A)  Final    ESCHERICHIA  COLI SUSCEPTIBILITIES PERFORMED ON PREVIOUS CULTURE WITHIN THE LAST 5 DAYS. Performed at [facility], [address] [place], [place] [address]    Report Status 02/23/2017 FINAL  Final  MRSA PCR Screening     Status: Abnormal   Collection Time: 02/20/17  6:00 PM  Result Value Ref Range Status   MRSA by PCR POSITIVE (A) NEGATIVE Final    Comment:        The GeneXpert MRSA Assay (FDA approved for NASAL specimens only), is one component of a comprehensive MRSA colonization surveillance program. It is not intended to diagnose MRSA infection nor to guide or monitor treatment for MRSA infections. RESULT CALLED TO, READ BACK BY AND VERIFIED WITH: ARMSTRONG,S AT 1959 ON 02/20/2017 BY MOSLEY,J   Urine culture     Status: Abnormal   Collection Time: 02/20/17  9:30 PM  Result Value Ref Range Status   Specimen Description URINE, CLEAN CATCH  Final   Special Requests Normal  Final   Culture (A)  Final    >=100,000 COLONIES/mL ESCHERICHIA COLI Confirmed Extended Spectrum Beta-Lactamase Producer (ESBL) Performed at [facility], [address] [place], [place] [address]    Report Status 02/23/2017 FINAL  Final   Organism ID, Bacteria ESCHERICHIA COLI (A)  Final      Susceptibility   Escherichia coli - MIC*    AMPICILLIN >=32 RESISTANT Resistant     CEFAZOLIN >=64 RESISTANT Resistant     CEFTRIAXONE >=64 RESISTANT Resistant     CIPROFLOXACIN <=0.25 SENSITIVE Sensitive     GENTAMICIN <=1 SENSITIVE Sensitive     IMIPENEM <=0.25 SENSITIVE Sensitive     NITROFURANTOIN <=16 SENSITIVE Sensitive     TRIMETH/SULFA >=320 RESISTANT Resistant     AMPICILLIN/SULBACTAM >=32 RESISTANT Resistant     PIP/TAZO 8 SENSITIVE Sensitive     Extended ESBL POSITIVE Resistant     * >=100,000 COLONIES/mL ESCHERICHIA COLI     Scheduled Meds: . chlorhexidine  15 mL Mouth Rinse BID  . Chlorhexidine Gluconate Cloth  6 each Topical Q0600  . feeding supplement (ENSURE ENLIVE)  237 mL Oral BID BM   . heparin subcutaneous  5,000 Units Subcutaneous Q8H  . mouth rinse  15 mL Mouth Rinse q12n4p  . mupirocin ointment  1 application Nasal BID  . pantoprazole  40 mg Oral Daily  . piperacillin-tazobactam (ZOSYN)  IV  3.375 g Intravenous Q8H  . risperiDONE  0.5 mg Oral BID  . tamsulosin  0.4 mg Oral Daily   Continuous Infusions:  Procedures/Studies: [place] Renal  Result Date: 01/30/2017 CLINICAL DATA:  Acute kidney injury. EXAM: RENAL / URINARY TRACT ULTRASOUND COMPLETE COMPARISON:  None. FINDINGS: Right Kidney: Length: 11.2 cm. Echogenicity is increased. Moderate hydronephrosis visualized. Left Kidney: Length: 10.8. Echogenicity is increased. Moderate hydronephrosis visualized. Bladder: Irregular, trabeculated appearance of the bladder wall is seen. IMPRESSION: Moderate bilateral hydronephrosis. Increased cortical echogenicity bilaterally consistent with medical renal disease. Trabeculated appearance of the urinary bladder wall suggestive of bladder outlet obstruction. Electronically Signed   By: Drusilla Kannerhomas  Dalessio M.D.   On: 01/30/2017 09:26   Dg Chest Port 1 View  Result Date: 02/20/2017 CLINICAL DATA:  Cough. Altered mental status for 1 day. Leukocytosis. EXAM: PORTABLE CHEST 1 VIEW COMPARISON:  None. FINDINGS: Hazy opacity of the right more than left bases, primarily concerning for pneumonia in this setting. No edema, effusion, or pneumothorax. Normal heart size and mediastinal contours. IMPRESSION: Opacities at the right greater than left bases primarily concerning for pneumonia or aspiration in this setting. Electronically Signed   By: Marnee SpringJonathon  Watts M.D.   On: 02/20/2017 14:15  Teagan Ozawa, DO  [facility] Pager [phone]  If 7PM-7AM, please contact night-coverage [email or web address] Password [email or web address] 02/23/2017, 12:10 PM   LOS: 3 days

## 2017-02-24 LAB — BASIC METABOLIC PANEL
Anion gap: 6 (ref 5–15)
BUN: 40 mg/dL — AB (ref 6–20)
CHLORIDE: 130 mmol/L — AB (ref 101–111)
CO2: 26 mmol/L (ref 22–32)
CREATININE: 1.18 mg/dL (ref 0.61–1.24)
Calcium: 8.3 mg/dL — ABNORMAL LOW (ref 8.9–10.3)
GFR calc Af Amer: >60 mL/min (ref >60)
GFR calc non Af Amer: 56 mL/min — ABNORMAL LOW (ref >60)
GLUCOSE: 126 mg/dL — AB (ref 65–99)
POTASSIUM: 3.4 mmol/L — AB (ref 3.5–5.1)
SODIUM: 162 mmol/L — AB (ref 135–145)

## 2017-02-24 LAB — CBC
HEMATOCRIT: 29.9 % — AB (ref 39.0–52.0)
Hemoglobin: 9 g/dL — ABNORMAL LOW (ref 13.0–17.0)
MCH: 24 pg — AB (ref 26.0–34.0)
MCHC: 30.1 g/dL (ref 30.0–36.0)
MCV: 79.7 fL (ref 78.0–100.0)
PLATELETS: 220 10*3/uL (ref 150–400)
RBC: 3.75 MIL/uL — ABNORMAL LOW (ref 4.22–5.81)
RDW: 16.8 % — AB (ref 11.5–15.5)
WBC: 13.1 10*3/uL — AB (ref 4.0–10.5)

## 2017-02-24 MED ORDER — SODIUM CHLORIDE 0.9 % IV SOLN
INTRAVENOUS | Status: DC
  Administered 2017-02-24 – 2017-02-25 (×3): via INTRAVENOUS
  Filled 2017-02-24 (×4): qty 1000

## 2017-02-24 NOTE — Progress Notes (Signed)
PROGRESS NOTE  Scott Park ZOX:[id number] DOB: [birth year] DOA: 02/20/2017 PCP: Pete Glatter, MD  Brief History: 83 year old male with a history of dementia, hypertension, chronic bladder outlet obstruction presenting with altered mental status from his SNF. The patient is unable to provide any significant history. All of this history is obtained from review of his medical record. Apparently, the patient was noted to have increasing confusion over the past 2 days prior to admission by nursing staff at his SNF.Although the patient has dementia, the patient is normally alert and interactive. Blood work at [employer] leukocytosis. Blood cultures were obtained and the patient was given IMceftriaxone. However, the patient continued to be less responsive. As result, he presented to emergency department for further evaluation. In the ED, the patient was noted to have WBC 26.0 with serum creatinine 4.97. Patient was notedto have large bladder on exam with bladder scan >1L - foley exchangedwith drainage of purulent, yellow urine. The patient was started on IV fluids and intravenous antibiotics after cultures were obtained.  Assessment/Plan: Sepsis -Secondary to bacteremia, HCAPand UTI -Continue Merrem -Lactic acid 2.04>>>1.3 -Urinalysis TNTC WBC -Chest x-ray R>Lbasilar opacities -blood culture = E.coli  Bacteremia--E. Coli--ESBL -blood culture = E. Coli -source is urine -Discontinue vancomycin -Zosyn changed to Merrem  HCAP -concerned about a component of aspiration -continue merrem -speech therapy eval once more alert  UTI with indwelling foley catheter -foley from SNF clogged, not draining, replaced in ED -continue Merrem  Acute on chronic renal failure--CKD stage III -Baseline creatinine 1.2-1.3 -Continue IV fluids -Presenting creatinine 4.97  Hypernatremia -change to D5 with KCl @ 125cc/hr  Acute encephalopathy -Secondary to acute on chronic renal  failure and infectious process -only incremental improvement  Chronic bladder outlet obstruction -Foley catheter was exchagedin the emergency department this admission -continue Flomax  Dementia with behavior disturbance -Risperdal bid if able to take po -haldol prn agitation  Hypertension -Holding atenolol secondary to soft blood pressure initially -still not alert enough to take po  Sacral and heel decubiti -Present at the time of admission -Wound care nurse consult appreciated   Protein calorie malnutrition Nutrition consult once able to take by mouth.     Disposition Plan: SNF vs residential in 1-2 days; if no significant improvement in 24-48 hrs-->comfort care Family Communication: No family at bedside   Consultants: none  Code Status: FULL  DVT Prophylaxis: Smith Heparin   Procedures: As Listed in Progress Note Above  Antibiotics: Vancomycin 3/1>>>3/2 Zosyn 3/1>>>    Subjective: Patient awakens to voice. Does not follow commands. No reports of vomiting, diarrhea, respiratory distress.  Objective: Vitals:   02/24/17 0000 02/24/17 0059 02/24/17 0600 02/24/17 1415  BP:   (!) 154/65   Pulse: 99 88 95 (!) 116  Resp: (!) 22  20 (!) 24  Temp:   99.1 F (37.3 C) 99.6 F (37.6 C)  TempSrc:   Axillary Axillary  SpO2:   98% (!) 85%  Weight:      Height:        Intake/Output Summary (Last 24 hours) at 02/24/17 1452 Last data filed at 02/24/17 1443  Gross per 24 hour  Intake          1068.75 ml  Output             1651 ml  Net          -582.25 ml   Weight change:  Exam:   General:  Pt is alert,  does not commands appropriately, not in acute distress  HEENT: No icterus, No thrush, No neck mass, [place]/AT  Cardiovascular: RRR, S1/S2, no rubs, no gallops  Respiratory: Bibasilar rales. , no wheezing, no crackles, no rhonchi  Abdomen: Soft/+BS, non tender, non distended, no guarding  Extremities: No edema, No lymphangitis, No  petechiae, No rashes, no synovitis   Data Reviewed: I have personally reviewed following labs and imaging studies Basic Metabolic Panel:  Recent Labs Lab 02/20/17 1337 02/20/17 1406 02/21/17 0427 02/22/17 0334 02/22/17 0341 02/23/17 0550 02/23/17 1207 02/24/17 0522  NA 147* 148* 152*  --  154*  --  159* 162*  K 4.6 4.5 4.0  --  3.1*  --  3.2* 3.4*  CL 112* 111 118*  --  123*  --  128* 130*  CO2 20*  --  21*  --  22  --  26 26  GLUCOSE 146* 143* 124*  --  110*  --  124* 126*  BUN 100* 89* 88*  --  81*  --  55* 40*  CREATININE 4.97* 5.00* 3.48*  --  2.14* 1.50* 1.43* 1.18  CALCIUM 8.5*  --  8.4*  --  8.3*  --  8.1* 8.3*  MG 2.2  --   --  2.1  --   --   --   --    Liver Function Tests:  Recent Labs Lab 02/20/17 1337 02/21/17 0427  AST 45* 36  ALT 24 25  ALKPHOS 32* 31*  BILITOT 0.8 0.7  PROT 6.1* 5.9*  ALBUMIN 2.5* 2.3*   No results for input(s): LIPASE, AMYLASE in the last 168 hours. No results for input(s): AMMONIA in the last 168 hours. Coagulation Profile:  Recent Labs Lab 02/20/17 1337  INR 1.39   CBC:  Recent Labs Lab 02/20/17 1337 02/20/17 1406 02/21/17 0427 02/22/17 0341 02/23/17 1207 02/24/17 0522  WBC 26.0*  --  25.0* 22.0* 14.2* 13.1*  NEUTROABS 24.4*  --   --   --   --   --   HGB 8.0* 18.4* 9.2* 9.1* 8.6* 9.0*  HCT 25.6* 54.0* 29.8* 29.0* 28.4* 29.9*  MCV 75.1*  --  76.8* 77.1* 78.7 79.7  PLT 340  --  231 206 192 220   Cardiac Enzymes:  Recent Labs Lab 02/20/17 1337 02/20/17 2226 02/21/17 0427  TROPONINI 0.19* 0.10* 0.09*   BNP: Invalid input(s): POCBNP CBG: No results for input(s): GLUCAP in the last 168 hours. HbA1C: No results for input(s): HGBA1C in the last 72 hours. Urine analysis:    Component Value Date/Time   COLORURINE YELLOW 02/20/2017 1412   APPEARANCEUR TURBID (A) 02/20/2017 1412   LABSPEC 1.010 02/20/2017 1412   PHURINE 6.0 02/20/2017 1412   GLUCOSEU NEGATIVE 02/20/2017 1412   HGBUR LARGE (A) 02/20/2017  1412   BILIRUBINUR NEGATIVE 02/20/2017 1412   KETONESUR NEGATIVE 02/20/2017 1412   PROTEINUR 100 (A) 02/20/2017 1412   NITRITE NEGATIVE 02/20/2017 1412   LEUKOCYTESUR LARGE (A) 02/20/2017 1412   Sepsis Labs: @LABRCNTIP (procalcitonin:4,lacticidven:4) ) Recent Results (from the past 240 hour(s))  Blood Culture (routine x 2)     Status: Abnormal   Collection Time: 02/20/17  1:37 PM  Result Value Ref Range Status   Specimen Description BLOOD LEFT WRIST  Final   Special Requests BOTTLES DRAWN AEROBIC AND ANAEROBIC 5 CC EA  Final   Culture  Setup Time   Final    GRAM NEGATIVE RODS IN BOTH AEROBIC AND ANAEROBIC BOTTLES CRITICAL RESULT CALLED TO, READ BACK  BY AND VERIFIED WITH: M. RENZ PHARMD, AT 1610 02/21/17 BY D. VANHOOK    Culture (A)  Final    ESCHERICHIA COLI Confirmed Extended Spectrum Beta-Lactamase Producer (ESBL) Performed at [facility], [address], [place], [place] [address]    Report Status 02/23/2017 FINAL  Final   Organism ID, Bacteria ESCHERICHIA COLI  Final      Susceptibility   Escherichia coli - MIC*    AMPICILLIN >=32 RESISTANT Resistant     CEFAZOLIN >=64 RESISTANT Resistant     CEFEPIME RESISTANT Resistant     CEFTAZIDIME RESISTANT Resistant     CEFTRIAXONE >=64 RESISTANT Resistant     CIPROFLOXACIN <=0.25 SENSITIVE Sensitive     GENTAMICIN <=1 SENSITIVE Sensitive     IMIPENEM <=0.25 SENSITIVE Sensitive     TRIMETH/SULFA >=320 RESISTANT Resistant     AMPICILLIN/SULBACTAM >=32 RESISTANT Resistant     PIP/TAZO 8 SENSITIVE Sensitive     Extended ESBL POSITIVE Resistant     * ESCHERICHIA COLI  Blood Culture ID Panel (Reflexed)     Status: Abnormal   Collection Time: 02/20/17  1:37 PM  Result Value Ref Range Status   Enterococcus species NOT DETECTED NOT DETECTED Final   Listeria monocytogenes NOT DETECTED NOT DETECTED Final   Staphylococcus species NOT DETECTED NOT DETECTED Final   Staphylococcus aureus NOT DETECTED NOT DETECTED Final    Streptococcus species NOT DETECTED NOT DETECTED Final   Streptococcus agalactiae NOT DETECTED NOT DETECTED Final   Streptococcus pneumoniae NOT DETECTED NOT DETECTED Final   Streptococcus pyogenes NOT DETECTED NOT DETECTED Final   Acinetobacter baumannii NOT DETECTED NOT DETECTED Final   Enterobacteriaceae species DETECTED (A) NOT DETECTED Final    Comment: Enterobacteriaceae represent a large family of gram-negative bacteria, not a single organism. CRITICAL RESULT CALLED TO, READ BACK BY AND VERIFIED WITH: M. RENZ PHARMD, AT 4098 02/21/17 BY D. VANHOOK    Enterobacter cloacae complex NOT DETECTED NOT DETECTED Final   Escherichia coli DETECTED (A) NOT DETECTED Final    Comment: CRITICAL RESULT CALLED TO, READ BACK BY AND VERIFIED WITH: M. RENZ PHARMD, AT 1191 02/21/17 BY D. VANHOOK    Klebsiella oxytoca NOT DETECTED NOT DETECTED Final   Klebsiella pneumoniae NOT DETECTED NOT DETECTED Final   Proteus species NOT DETECTED NOT DETECTED Final   Serratia marcescens NOT DETECTED NOT DETECTED Final   Carbapenem resistance NOT DETECTED NOT DETECTED Final   Haemophilus influenzae NOT DETECTED NOT DETECTED Final   Neisseria meningitidis NOT DETECTED NOT DETECTED Final   Pseudomonas aeruginosa NOT DETECTED NOT DETECTED Final   Candida albicans NOT DETECTED NOT DETECTED Final   Candida glabrata NOT DETECTED NOT DETECTED Final   Candida krusei NOT DETECTED NOT DETECTED Final   Candida parapsilosis NOT DETECTED NOT DETECTED Final   Candida tropicalis NOT DETECTED NOT DETECTED Final    Comment: Performed at [facility], [address], [place], [place] [address]  Blood Culture (routine x 2)     Status: Abnormal   Collection Time: 02/20/17  1:57 PM  Result Value Ref Range Status   Specimen Description BLOOD BLOOD RIGHT FOREARM  Final   Special Requests BOTTLES DRAWN AEROBIC AND ANAEROBIC 5 CC EA  Final   Culture  Setup Time   Final    GRAM NEGATIVE RODS IN BOTH AEROBIC AND ANAEROBIC  BOTTLES CRITICAL VALUE NOTED.  VALUE IS CONSISTENT WITH PREVIOUSLY REPORTED AND CALLED VALUE.    Culture (A)  Final    ESCHERICHIA  COLI SUSCEPTIBILITIES PERFORMED ON PREVIOUS CULTURE WITHIN THE LAST 5 DAYS. Performed at [facility], [address] [place], [place] [address]    Report Status 02/23/2017 FINAL  Final  MRSA PCR Screening     Status: Abnormal   Collection Time: 02/20/17  6:00 PM  Result Value Ref Range Status   MRSA by PCR POSITIVE (A) NEGATIVE Final    Comment:        The GeneXpert MRSA Assay (FDA approved for NASAL specimens only), is one component of a comprehensive MRSA colonization surveillance program. It is not intended to diagnose MRSA infection nor to guide or monitor treatment for MRSA infections. RESULT CALLED TO, READ BACK BY AND VERIFIED WITH: ARMSTRONG,S AT 1959 ON 02/20/2017 BY MOSLEY,J   Urine culture     Status: Abnormal   Collection Time: 02/20/17  9:30 PM  Result Value Ref Range Status   Specimen Description URINE, CLEAN CATCH  Final   Special Requests Normal  Final   Culture (A)  Final    >=100,000 COLONIES/mL ESCHERICHIA COLI Confirmed Extended Spectrum Beta-Lactamase Producer (ESBL) Performed at [facility], [address] [place], [place] [address]    Report Status 02/23/2017 FINAL  Final   Organism ID, Bacteria ESCHERICHIA COLI (A)  Final      Susceptibility   Escherichia coli - MIC*    AMPICILLIN >=32 RESISTANT Resistant     CEFAZOLIN >=64 RESISTANT Resistant     CEFTRIAXONE >=64 RESISTANT Resistant     CIPROFLOXACIN <=0.25 SENSITIVE Sensitive     GENTAMICIN <=1 SENSITIVE Sensitive     IMIPENEM <=0.25 SENSITIVE Sensitive     NITROFURANTOIN <=16 SENSITIVE Sensitive     TRIMETH/SULFA >=320 RESISTANT Resistant     AMPICILLIN/SULBACTAM >=32 RESISTANT Resistant     PIP/TAZO 8 SENSITIVE Sensitive     Extended ESBL POSITIVE Resistant     * >=100,000 COLONIES/mL ESCHERICHIA COLI     Scheduled Meds: . chlorhexidine  15  mL Mouth Rinse BID  . Chlorhexidine Gluconate Cloth  6 each Topical Q0600  . feeding supplement (ENSURE ENLIVE)  237 mL Oral BID BM  . heparin subcutaneous  5,000 Units Subcutaneous Q8H  . mouth rinse  15 mL Mouth Rinse q12n4p  . meropenem (MERREM) IV  1 g Intravenous Q12H  . mupirocin ointment  1 application Nasal BID  . pantoprazole  40 mg Oral Daily  . tamsulosin  0.4 mg Oral Daily   Continuous Infusions: . sodium chloride 0.9 % 1,000 mL with potassium chloride 20 mEq infusion      Procedures/Studies: [place] Renal  Result Date: 01/30/2017 CLINICAL DATA:  Acute kidney injury. EXAM: RENAL / URINARY TRACT ULTRASOUND COMPLETE COMPARISON:  None. FINDINGS: Right Kidney: Length: 11.2 cm. Echogenicity is increased. Moderate hydronephrosis visualized. Left Kidney: Length: 10.8. Echogenicity is increased. Moderate hydronephrosis visualized. Bladder: Irregular, trabeculated appearance of the bladder wall is seen. IMPRESSION: Moderate bilateral hydronephrosis. Increased cortical echogenicity bilaterally consistent with medical renal disease. Trabeculated appearance of the urinary bladder wall suggestive of bladder outlet obstruction. Electronically Signed   By: Drusilla Kannerhomas  Dalessio M.D.   On: 01/30/2017 09:26   Dg Chest Port 1 View  Result Date: 02/20/2017 CLINICAL DATA:  Cough. Altered mental status for 1 day. Leukocytosis. EXAM: PORTABLE CHEST 1 VIEW COMPARISON:  None. FINDINGS: Hazy opacity of the right more than left bases, primarily concerning for pneumonia in this setting. No edema, effusion, or pneumothorax. Normal heart size and mediastinal contours. IMPRESSION: Opacities at the right greater  than left bases primarily concerning for pneumonia or aspiration in this setting. Electronically Signed   By: Marnee Spring M.D.   On: 02/20/2017 14:15    Sharone Almond, DO  [facility] Pager [phone]  If 7PM-7AM, please contact night-coverage [email or web address] Password [email or web address] 02/24/2017, 2:52 PM   LOS:  4 days

## 2017-02-24 NOTE — Progress Notes (Signed)
CRITICAL VALUE ALERT  Critical value received: Sodium 162  Date of notification:  02/24/17  Time of notification: 0626  Critical value read back:yes  Nurse who received alert:  Karie SchwalbeJoanne Aracelis Ulrey  MD notified (1st page):  YES  Time of first page: [phone]

## 2017-02-24 NOTE — Progress Notes (Signed)
NO PO medications given today. Patient  Is unable to follow commands. PO meds held

## 2017-02-24 NOTE — Plan of Care (Signed)
Problem: Skin Integrity: Goal: Risk for impaired skin integrity will decrease Outcome: Progressing Pt has been turned every 2 hours to assist.

## 2017-02-24 NOTE — Plan of Care (Signed)
Problem: Safety: Goal: Ability to remain free from injury will improve Outcome: Completed/Met Date Met: 02/24/17 Bed alarm set

## 2017-02-24 NOTE — Progress Notes (Signed)
SLP Cancellation Note  Patient Details Name: Scott Park MRN: [id number] DOB: 09/09/1933   Cancelled treatment:       Reason Eval/Treat Not Completed: Patient's level of consciousness. RN indicates pt will rouse briefly, and moans when moved, but is otherwise not alert or appropriate for po intake. Will continue efforts.  Scott Park, [facility], CCC-SLP [phone]  Scott Park, Scott Park Brown 02/24/2017, 1:54 PM

## 2017-02-24 NOTE — Evaluation (Signed)
Physical Therapy Evaluation Patient Details Name: Scott MainlandJohn Cayson MRN: [id number] DOB: 09/18/1933 Today's Date: 02/24/2017   History of Present Illness  83 year old male with a history of dementia, hypertension, chronic bladder outlet obstruction, recent R foot cellulitis and AKI and presented with altered mental status, admitted for sepsis secondary to bacteremia, HCAP and UTI  Clinical Impression  Pt admitted with above diagnosis. Pt currently with functional limitations due to the deficits listed below (see PT Problem List).  Pt will benefit from skilled PT to increase their independence and safety with mobility to allow discharge to the venue listed below.  Pt requiring total assist for rolling at this time and does not present at cognitive baseline per RN.  Pt not following commands or assisting with mobility.  Recommend return to SNF.     Follow Up Recommendations SNF    Equipment Recommendations  None recommended by PT    Recommendations for Other Services       Precautions / Restrictions Precautions Precautions: Fall Precaution Comments: multiple areas of skin breakdown (please refer to WOC RN note)      Mobility  Bed Mobility Overal bed mobility: Needs Assistance Bed Mobility: Rolling Rolling: Total assist;+2 for physical assistance         General bed mobility comments: total assist for rolling and repositioning at this time, did not attempt further mobility for safety due to assist level and decreased cognition, repositioned due to sacral pressure injury  Transfers                    Ambulation/Gait                Stairs            Wheelchair Mobility    Modified Rankin (Stroke Patients Only)       Balance Overall balance assessment: History of Falls                                           Pertinent Vitals/Pain Pain Assessment: Faces Faces Pain Scale: Hurts even more Pain Location: grimicing with rolling, pt  states yes to sacral area pain Pain Descriptors / Indicators: Grimacing;Moaning Pain Intervention(s): Monitored during session (RN present and aware)    Home Living Family/patient expects to be discharged to:: Skilled nursing facility                 Additional Comments: pt was living with son who travels frequently upon previous admission - discharged to SNF at that time, presents from SNF    Prior Function Level of Independence: Needs assistance   Gait / Transfers Assistance Needed: ambulatory with falls approx one month ago per last admission notes  ADL's / Homemaking Assistance Needed: needed assistance with bathing         Hand Dominance        Extremity/Trunk Assessment        Lower Extremity Assessment Lower Extremity Assessment: RLE deficits/detail;LLE deficits/detail;Difficult to assess due to impaired cognition RLE Deficits / Details: required assist for all movement, prevalon boots in place LLE Deficits / Details: required assist for all movement, prevalon boots in place       Communication   Communication: Expressive difficulties (attempts at speech not clear, also hx of dementia)  Cognition Arousal/Alertness: Awake/alert Behavior During Therapy: Flat affect  General Comments: pt with hx of dementia, RN reports cognition not at baseline currently - pt not following commands however looks over at call of his name, only spoke twice, once to answer pain questions and again however incomprehensible    General Comments      Exercises     Assessment/Plan    PT Assessment Patient needs continued PT services  PT Problem List Decreased strength;Decreased cognition;Decreased activity tolerance;Decreased balance;Decreased mobility;Decreased knowledge of use of DME       PT Treatment Interventions Gait training;DME instruction;Functional mobility training;Therapeutic exercise;Therapeutic activities;Patient/family education;Balance  training    PT Goals (Current goals can be found in the Care Plan section)  Acute Rehab PT Goals PT Goal Formulation: Patient unable to participate in goal setting Time For Goal Achievement: 03/10/17 Potential to Achieve Goals: Fair    Frequency Min 2X/week   Barriers to discharge        Co-evaluation               End of Session   Activity Tolerance: Other (comment);Patient limited by pain (limited by cognition) Patient left: in bed;with call bell/phone within reach;with nursing/sitter in room   PT Visit Diagnosis: Other abnormalities of gait and mobility (R26.89)         Time: 1000-1013 PT Time Calculation (min) (ACUTE ONLY): 13 min   Charges:   PT Evaluation $PT Eval Low Complexity: 1 Procedure     PT G Codes:         Bertina Guthridge,KATHrine E 02/24/2017, 10:44 AM Zenovia Jarred, PT, DPT 02/24/2017 Pager: [phone]

## 2017-02-24 NOTE — Progress Notes (Signed)
CCMD notified the RN that the patient had a short run of SVT 6< seconds,  PCP on call was notified.

## 2017-02-25 ENCOUNTER — Inpatient Hospital Stay (HOSPITAL_COMMUNITY): Payer: Medicare Other

## 2017-02-25 DIAGNOSIS — J690 Pneumonitis due to inhalation of food and vomit: Secondary | ICD-10-CM

## 2017-02-25 LAB — URINALYSIS, ROUTINE W REFLEX MICROSCOPIC
Bilirubin Urine: NEGATIVE
Glucose, UA: NEGATIVE mg/dL
KETONES UR: NEGATIVE mg/dL
Nitrite: NEGATIVE
PH: 5 (ref 5.0–8.0)
PROTEIN: NEGATIVE mg/dL
Specific Gravity, Urine: 1.014 (ref 1.005–1.030)

## 2017-02-25 LAB — CBC
HCT: 30.2 % — ABNORMAL LOW (ref 39.0–52.0)
Hemoglobin: 8.7 g/dL — ABNORMAL LOW (ref 13.0–17.0)
MCH: 23.1 pg — ABNORMAL LOW (ref 26.0–34.0)
MCHC: 28.8 g/dL — ABNORMAL LOW (ref 30.0–36.0)
MCV: 80.3 fL (ref 78.0–100.0)
PLATELETS: 275 10*3/uL (ref 150–400)
RBC: 3.76 MIL/uL — AB (ref 4.22–5.81)
RDW: 17.1 % — AB (ref 11.5–15.5)
WBC: 10 10*3/uL (ref 4.0–10.5)

## 2017-02-25 LAB — BASIC METABOLIC PANEL
BUN: 41 mg/dL — AB (ref 6–20)
CALCIUM: 8 mg/dL — AB (ref 8.9–10.3)
CO2: 26 mmol/L (ref 22–32)
CREATININE: 1.27 mg/dL — AB (ref 0.61–1.24)
GFR calc Af Amer: 59 mL/min — ABNORMAL LOW (ref >60)
GFR, EST NON AFRICAN AMERICAN: 51 mL/min — AB (ref >60)
Glucose, Bld: 99 mg/dL (ref 65–99)
Potassium: 3.6 mmol/L (ref 3.5–5.1)
SODIUM: 166 mmol/L — AB (ref 135–145)

## 2017-02-25 MED ORDER — POTASSIUM CL IN DEXTROSE 5% 20 MEQ/L IV SOLN: 20.0000 meq | INTRAVENOUS | Status: DC

## 2017-02-25 MED ORDER — POTASSIUM CL IN DEXTROSE 5% 20 MEQ/L IV SOLN
20.0000 meq | INTRAVENOUS | Status: DC
  Administered 2017-02-25 – 2017-02-27 (×6): 20 meq via INTRAVENOUS
  Filled 2017-02-25 (×7): qty 1000

## 2017-02-25 NOTE — Progress Notes (Signed)
PROGRESS NOTE  [place] ZOX:[id number] DOB: 04-28-1933 DOA: 02/20/2017 PCP: Pete Glatter, MD  Brief History: 83 year old male with a history of dementia, hypertension, chronic bladder outlet obstruction presenting with altered mental status from his SNF. The patient is unable to provide any significant history. All of this history is obtained from review of his medical record. Apparently, the patient was noted to have increasing confusion over the past 2 days prior to admission by nursing staff at his SNF.Although the patient has dementia, the patient is normally alert and interactive. Blood work at [employer] leukocytosis. Blood cultures were obtained and the patient was given IMceftriaxone. However, the patient continued to be less responsive. As result, he presented to emergency department for further evaluation. In the ED, the patient was noted to have WBC 26.0 with serum creatinine 4.97. Patient was notedto have large bladder on exam with bladder scan >1L - foley exchangedwith drainage of purulent, yellow urine. The patient was started on IV fluids and intravenous antibiotics after cultures were obtained.  Assessment/Plan: Sepsis -Secondary to bacteremia, HCAPand UTI -Continue Merrem -Lactic acid 2.04>>>1.3 -Urinalysis TNTC WBC -Chest x-ray R>Lbasilar opacities -02/20/17 blood culture = ESBLE.coli -02/24/17 evening--spiked new fever 101.8-->reculture blood and urine-->suspect recurrent aspiration  Bacteremia--E. Coli--ESBL -blood culture = E. Coli -source is urine -Discontinue vancomycin -Zosyn changed to Merrem on 3/4  HCAP -concerned about a component of aspiration -continue merrem -speech therapy eval--unable to perform assessment due to persistent lethargy  UTI with indwelling foley catheter -foley from SNF clogged, not draining, replaced in ED -continue Merrem  Acute on chronic renal failure--CKD stage III -Baseline creatinine 1.2-1.3 -Continue  IV fluids-->improving -Presenting creatinine 4.97  Hypernatremia -change to D5 with KCl @ 125cc/hr  Acute encephalopathy -Secondary to acute on chronic renal failure and infectious process -only small improvement  Chronic bladder outlet obstruction -Foley catheter was exchagedin the emergency department this admission -continue Flomax  Dementia with behavior disturbance -Risperdal bid if able to take po -haldol prn agitation  Hypertension -Holding atenolol secondary to soft blood pressure initially -still not alert enough to take po  Sacral and heel decubiti -Present at the time of admission -Woundcare nurse consult appreciated   Protein calorie malnutrition Nutrition consult once able to take by mouth.     Disposition Plan:  if no significant improvement in 24-48 hrs-->comfort care and residential hospice Family Communication: son updated at bedside--Total time spent 35 minutes.  Greater than 50% spent face to face counseling and coordinating care.   Consultants: none  Code Status: FULL  DVT Prophylaxis: [place] Heparin   Procedures: As Listed in Progress Note Above  Antibiotics: Vancomycin 3/1>>>3/2 Zosyn 3/1>>>3/4 Merrem 3/4>>>    Subjective: Patient is alert and awake, he intermittently follows commands but is difficult to understand. No reports of respiratory distress, vomiting, diarrhea. No reports of uncontrolled pain. Remainder review of systems unobtainable secondary to patient's encephalopathy.  Objective: Vitals:   02/24/17 2044 02/24/17 2314 02/25/17 0615 02/25/17 1358  BP: (!) 143/73  (!) 162/76 (!) 158/75  Pulse: (!) 118  97 (!) 113  Resp: (!) 30  (!) 26 (!) 26  Temp: (!) 101.8 F (38.8 C) (!) 100.4 F (38 C) 98.1 F (36.7 C) 99.5 F (37.5 C)  TempSrc: Axillary Axillary Axillary Axillary  SpO2: 100%  100% 98%  Weight:      Height:        Intake/Output Summary (Last 24 hours) at 02/25/17 1821 Last data filed  at 02/25/17 1801  Gross per 24 hour  Intake           354.17 ml  Output             3075 ml  Net         -2720.83 ml   Weight change:  Exam:   General:  Pt is alert, follows commands appropriately, not in acute distress  HEENT: No icterus, No thrush, No neck mass, [place]/AT  Cardiovascular: RRR, S1/S2, no rubs, no gallops  Respiratory: Bibasilar rales. No wheezing. Good air movement.  Abdomen: Soft/+BS, non tender, non distended, no guarding  Extremities: trace LE edema, No lymphangitis, No petechiae, No rashes, no synovitis   Data Reviewed: I have personally reviewed following labs and imaging studies Basic Metabolic Panel:  Recent Labs Lab 02/20/17 1337  02/21/17 0427 02/22/17 0334 02/22/17 0341 02/23/17 0550 02/23/17 1207 02/24/17 0522 02/25/17 0643  NA 147*  < > 152*  --  154*  --  159* 162* 166*  K 4.6  < > 4.0  --  3.1*  --  3.2* 3.4* 3.6  CL 112*  < > 118*  --  123*  --  128* 130* >130*  CO2 20*  --  21*  --  22  --  26 26 26   GLUCOSE 146*  < > 124*  --  110*  --  124* 126* 99  BUN 100*  < > 88*  --  81*  --  55* 40* 41*  CREATININE 4.97*  < > 3.48*  --  2.14* 1.50* 1.43* 1.18 1.27*  CALCIUM 8.5*  --  8.4*  --  8.3*  --  8.1* 8.3* 8.0*  MG 2.2  --   --  2.1  --   --   --   --   --   < > = values in this interval not displayed. Liver Function Tests:  Recent Labs Lab 02/20/17 1337 02/21/17 0427  AST 45* 36  ALT 24 25  ALKPHOS 32* 31*  BILITOT 0.8 0.7  PROT 6.1* 5.9*  ALBUMIN 2.5* 2.3*   No results for input(s): LIPASE, AMYLASE in the last 168 hours. No results for input(s): AMMONIA in the last 168 hours. Coagulation Profile:  Recent Labs Lab 02/20/17 1337  INR 1.39   CBC:  Recent Labs Lab 02/20/17 1337  02/21/17 0427 02/22/17 0341 02/23/17 1207 02/24/17 0522 02/25/17 0643  WBC 26.0*  --  25.0* 22.0* 14.2* 13.1* 10.0  NEUTROABS 24.4*  --   --   --   --   --   --   HGB 8.0*  < > 9.2* 9.1* 8.6* 9.0* 8.7*  HCT 25.6*  < > 29.8* 29.0* 28.4*  29.9* 30.2*  MCV 75.1*  --  76.8* 77.1* 78.7 79.7 80.3  PLT 340  --  231 206 192 220 275  < > = values in this interval not displayed. Cardiac Enzymes:  Recent Labs Lab 02/20/17 1337 02/20/17 2226 02/21/17 0427  TROPONINI 0.19* 0.10* 0.09*   BNP: Invalid input(s): POCBNP CBG: No results for input(s): GLUCAP in the last 168 hours. HbA1C: No results for input(s): HGBA1C in the last 72 hours. Urine analysis:    Component Value Date/Time   COLORURINE YELLOW 02/25/2017 1646   APPEARANCEUR CLOUDY (A) 02/25/2017 1646   LABSPEC 1.014 02/25/2017 1646   PHURINE 5.0 02/25/2017 1646   GLUCOSEU NEGATIVE 02/25/2017 1646   HGBUR SMALL (A) 02/25/2017 1646   BILIRUBINUR NEGATIVE 02/25/2017 1646  KETONESUR NEGATIVE 02/25/2017 1646   PROTEINUR NEGATIVE 02/25/2017 1646   NITRITE NEGATIVE 02/25/2017 1646   LEUKOCYTESUR LARGE (A) 02/25/2017 1646   Sepsis Labs: @LABRCNTIP (procalcitonin:4,lacticidven:4) ) Recent Results (from the past 240 hour(s))  Blood Culture (routine x 2)     Status: Abnormal   Collection Time: 02/20/17  1:37 PM  Result Value Ref Range Status   Specimen Description BLOOD LEFT WRIST  Final   Special Requests BOTTLES DRAWN AEROBIC AND ANAEROBIC 5 CC EA  Final   Culture  Setup Time   Final    GRAM NEGATIVE RODS IN BOTH AEROBIC AND ANAEROBIC BOTTLES CRITICAL RESULT CALLED TO, READ BACK BY AND VERIFIED WITH: M. RENZ PHARMD, AT [id number] 02/21/17 BY D. VANHOOK    Culture (A)  Final    ESCHERICHIA COLI Confirmed Extended Spectrum Beta-Lactamase Producer (ESBL) Performed at [facility], [address] [place], [place] [address]    Report Status 02/23/2017 FINAL  Final   Organism ID, Bacteria ESCHERICHIA COLI  Final      Susceptibility   Escherichia coli - MIC*    AMPICILLIN >=32 RESISTANT Resistant     CEFAZOLIN >=64 RESISTANT Resistant     CEFEPIME RESISTANT Resistant     CEFTAZIDIME RESISTANT Resistant     CEFTRIAXONE >=64 RESISTANT Resistant     CIPROFLOXACIN  <=0.25 SENSITIVE Sensitive     GENTAMICIN <=1 SENSITIVE Sensitive     IMIPENEM <=0.25 SENSITIVE Sensitive     TRIMETH/SULFA >=320 RESISTANT Resistant     AMPICILLIN/SULBACTAM >=32 RESISTANT Resistant     PIP/TAZO 8 SENSITIVE Sensitive     Extended ESBL POSITIVE Resistant     * ESCHERICHIA COLI  Blood Culture ID Panel (Reflexed)     Status: Abnormal   Collection Time: 02/20/17  1:37 PM  Result Value Ref Range Status   Enterococcus species NOT DETECTED NOT DETECTED Final   Listeria monocytogenes NOT DETECTED NOT DETECTED Final   Staphylococcus species NOT DETECTED NOT DETECTED Final   Staphylococcus aureus NOT DETECTED NOT DETECTED Final   Streptococcus species NOT DETECTED NOT DETECTED Final   Streptococcus agalactiae NOT DETECTED NOT DETECTED Final   Streptococcus pneumoniae NOT DETECTED NOT DETECTED Final   Streptococcus pyogenes NOT DETECTED NOT DETECTED Final   Acinetobacter baumannii NOT DETECTED NOT DETECTED Final   Enterobacteriaceae species DETECTED (A) NOT DETECTED Final    Comment: Enterobacteriaceae represent a large family of gram-negative bacteria, not a single organism. CRITICAL RESULT CALLED TO, READ BACK BY AND VERIFIED WITH: M. RENZ PHARMD, AT [id number] 02/21/17 BY D. VANHOOK    Enterobacter cloacae complex NOT DETECTED NOT DETECTED Final   Escherichia coli DETECTED (A) NOT DETECTED Final    Comment: CRITICAL RESULT CALLED TO, READ BACK BY AND VERIFIED WITH: M. RENZ PHARMD, AT [id number] 02/21/17 BY D. VANHOOK    Klebsiella oxytoca NOT DETECTED NOT DETECTED Final   Klebsiella pneumoniae NOT DETECTED NOT DETECTED Final   Proteus species NOT DETECTED NOT DETECTED Final   Serratia marcescens NOT DETECTED NOT DETECTED Final   Carbapenem resistance NOT DETECTED NOT DETECTED Final   Haemophilus influenzae NOT DETECTED NOT DETECTED Final   Neisseria meningitidis NOT DETECTED NOT DETECTED Final   Pseudomonas aeruginosa NOT DETECTED NOT DETECTED Final   Candida albicans NOT DETECTED NOT  DETECTED Final   Candida glabrata NOT DETECTED NOT DETECTED Final   Candida krusei NOT DETECTED NOT DETECTED Final   Candida parapsilosis NOT DETECTED NOT DETECTED Final   Candida tropicalis NOT DETECTED NOT DETECTED Final  Comment: Performed at [facility], [address] [place], [place] [address]  Blood Culture (routine x 2)     Status: Abnormal   Collection Time: 02/20/17  1:57 PM  Result Value Ref Range Status   Specimen Description BLOOD BLOOD RIGHT FOREARM  Final   Special Requests BOTTLES DRAWN AEROBIC AND ANAEROBIC 5 CC EA  Final   Culture  Setup Time   Final    GRAM NEGATIVE RODS IN BOTH AEROBIC AND ANAEROBIC BOTTLES CRITICAL VALUE NOTED.  VALUE IS CONSISTENT WITH PREVIOUSLY REPORTED AND CALLED VALUE.    Culture (A)  Final    ESCHERICHIA COLI SUSCEPTIBILITIES PERFORMED ON PREVIOUS CULTURE WITHIN THE LAST 5 DAYS. Performed at [facility], [address], [place], [place] [address]    Report Status 02/23/2017 FINAL  Final  MRSA PCR Screening     Status: Abnormal   Collection Time: 02/20/17  6:00 PM  Result Value Ref Range Status   MRSA by PCR POSITIVE (A) NEGATIVE Final    Comment:        The GeneXpert MRSA Assay (FDA approved for NASAL specimens only), is one component of a comprehensive MRSA colonization surveillance program. It is not intended to diagnose MRSA infection nor to guide or monitor treatment for MRSA infections. RESULT CALLED TO, READ BACK BY AND VERIFIED WITH: ARMSTRONG,S AT 1959 ON 02/20/2017 BY MOSLEY,J   Urine culture     Status: Abnormal   Collection Time: 02/20/17  9:30 PM  Result Value Ref Range Status   Specimen Description URINE, CLEAN CATCH  Final   Special Requests Normal  Final   Culture (A)  Final    >=100,000 COLONIES/mL ESCHERICHIA COLI Confirmed Extended Spectrum Beta-Lactamase Producer (ESBL) Performed at [facility], [address] [place], [place] [address]    Report Status 02/23/2017 FINAL  Final    Organism ID, Bacteria ESCHERICHIA COLI (A)  Final      Susceptibility   Escherichia coli - MIC*    AMPICILLIN >=32 RESISTANT Resistant     CEFAZOLIN >=64 RESISTANT Resistant     CEFTRIAXONE >=64 RESISTANT Resistant     CIPROFLOXACIN <=0.25 SENSITIVE Sensitive     GENTAMICIN <=1 SENSITIVE Sensitive     IMIPENEM <=0.25 SENSITIVE Sensitive     NITROFURANTOIN <=16 SENSITIVE Sensitive     TRIMETH/SULFA >=320 RESISTANT Resistant     AMPICILLIN/SULBACTAM >=32 RESISTANT Resistant     PIP/TAZO 8 SENSITIVE Sensitive     Extended ESBL POSITIVE Resistant     * >=100,000 COLONIES/mL ESCHERICHIA COLI     Scheduled Meds: . chlorhexidine  15 mL Mouth Rinse BID  . feeding supplement (ENSURE ENLIVE)  237 mL Oral BID BM  . heparin subcutaneous  5,000 Units Subcutaneous Q8H  . mouth rinse  15 mL Mouth Rinse q12n4p  . meropenem (MERREM) IV  1 g Intravenous Q12H  . pantoprazole  40 mg Oral Daily  . tamsulosin  0.4 mg Oral Daily   Continuous Infusions: . dextrose 5 % with KCl 20 mEq / L 20 mEq (02/25/17 1346)    Procedures/Studies: US Renal  Result Date: 01/30/2017 CLINICAL DATA:  Acute kidney injury. EXAM: RENAL / URINARY TRACT ULTRASOUND COMPLETE COMPARISON:  None. FINDINGS: Right Kidney: Length: 11.2 cm. Echogenicity is increased. Moderate hydronephrosis visualized. Left Kidney: Length: 10.8. Echogenicity is increased. Moderate hydronephrosis visualized. Bladder: Irregular, trabeculated appearance of the bladder wall is seen. IMPRESSION: Moderate bilateral hydronephrosis. Increased cortical echogenicity bilaterally consistent with medical renal disease. Trabeculated  appearance of the urinary bladder wall suggestive of bladder outlet obstruction. Electronically Signed   By: Drusilla Kanner M.D.   On: 01/30/2017 09:26   Dg Chest Port 1 View  Result Date: 02/25/2017 CLINICAL DATA:  Follow-up aspiration pneumonia EXAM: PORTABLE CHEST 1 VIEW COMPARISON:  02/20/2017 FINDINGS: Cardiomediastinal silhouette  is stable. Persistent basilar hazy infiltrate/pneumonia right greater than left. Probable small right pleural effusion. Surgical clips in right axilla again noted. No convincing pulmonary edema. IMPRESSION: Persistent basilar hazy infiltrate/pneumonia right greater than left. Probable small right pleural effusion. Surgical clips in right axilla again noted. No convincing pulmonary edema. Electronically Signed   By: Natasha Mead M.D.   On: 02/25/2017 14:42   Dg Chest Port 1 View  Result Date: 02/20/2017 CLINICAL DATA:  Cough. Altered mental status for 1 day. Leukocytosis. EXAM: PORTABLE CHEST 1 VIEW COMPARISON:  None. FINDINGS: Hazy opacity of the right more than left bases, primarily concerning for pneumonia in this setting. No edema, effusion, or pneumothorax. Normal heart size and mediastinal contours. IMPRESSION: Opacities at the right greater than left bases primarily concerning for pneumonia or aspiration in this setting. Electronically Signed   By: Marnee Spring M.D.   On: 02/20/2017 14:15    Venda Dice, DO  [facility] Pager [phone]  If 7PM-7AM, please contact night-coverage [email or web address] Password [email or web address] 02/25/2017, 6:21 PM   LOS: 5 days

## 2017-02-25 NOTE — Progress Notes (Signed)
CRITICAL VALUE ALERT  Critical value received: Na 133 and CL >130  Date of notification:  02/25/17  Time of notification:  0731  Critical value read back:Yes.    Nurse who received alert:  Murrell ReddenErika Marquavious Nazar   MD notified (1st page):  Dr Tat  Time of first page:  [phone]  MD notified (2nd page):  Time of second page:  Responding MD:  Dr. Arbutus Leasat  Time MD responded:  [phone]

## 2017-02-25 NOTE — Progress Notes (Signed)
Sacral dressing and bilateral heel dressings changed.

## 2017-02-26 DIAGNOSIS — T83511D Infection and inflammatory reaction due to indwelling urethral catheter, subsequent encounter: Secondary | ICD-10-CM

## 2017-02-26 LAB — BASIC METABOLIC PANEL
BUN: 37 mg/dL — ABNORMAL HIGH (ref 6–20)
CALCIUM: 8.2 mg/dL — AB (ref 8.9–10.3)
CO2: 26 mmol/L (ref 22–32)
Creatinine, Ser: 1.08 mg/dL (ref 0.61–1.24)
GFR calc non Af Amer: >60 mL/min (ref >60)
GLUCOSE: 130 mg/dL — AB (ref 65–99)
Potassium: 3.4 mmol/L — ABNORMAL LOW (ref 3.5–5.1)
Sodium: 164 mmol/L (ref 135–145)

## 2017-02-26 LAB — CBC
HEMATOCRIT: 29.8 % — AB (ref 39.0–52.0)
HEMOGLOBIN: 8.6 g/dL — AB (ref 13.0–17.0)
MCH: 23.6 pg — AB (ref 26.0–34.0)
MCHC: 28.9 g/dL — AB (ref 30.0–36.0)
MCV: 81.6 fL (ref 78.0–100.0)
Platelets: 314 10*3/uL (ref 150–400)
RBC: 3.65 MIL/uL — ABNORMAL LOW (ref 4.22–5.81)
RDW: 17.3 % — ABNORMAL HIGH (ref 11.5–15.5)
WBC: 12.1 10*3/uL — ABNORMAL HIGH (ref 4.0–10.5)

## 2017-02-26 LAB — URINE CULTURE

## 2017-02-26 MED ORDER — SODIUM CHLORIDE 0.9 % IV SOLN
1.0000 g | Freq: Three times a day (TID) | INTRAVENOUS | Status: DC
  Administered 2017-02-26 – 2017-02-27 (×3): 1 g via INTRAVENOUS
  Filled 2017-02-26 (×3): qty 1

## 2017-02-26 NOTE — Progress Notes (Signed)
Note per chart review *MD note from 3/6, pt now full comfort.  Will sign off, please reorder if desire.  Thanks. Donavan Burnetamara Ashaunti Treptow, MS [facility] SLP [phone]

## 2017-02-26 NOTE — Care Management Note (Signed)
Case Management Note  Patient Details  Name: Stann MainlandJohn Tumlin MRN: [id number] Date of Birth: 12/16/1933  Subjective/Objective: 83 y.o. Admitted 02/20/2017 to be discharged to SNF vs Hospice facility.                    Action/Plan:CM will sign off for now but will be available should additional discharge needs arise or disposition change.    Expected Discharge Date:   (unknown)               Expected Discharge Plan:  Skilled Nursing Facility  In-House Referral:  Clinical Social Work  Discharge planning Services     Post Acute Care Choice:  NA Choice offered to:     DME Arranged:    DME Agency:     HH Arranged:    HH Agency:     Status of Service:  Completed, signed off  If discussed at [employer] Meetings, dates discussed:    Additional Comments:  Yvone NeuCrutchfield, Jordin Dambrosio M, RN 02/26/2017, 11:06 AM

## 2017-02-26 NOTE — Progress Notes (Signed)
CRITICAL VALUE ALERT  Critical value received:  NA 164 Chloride >130  Date of notification:  02/26/17  Time of notification:  0623  Critical value read back:Yes.    Nurse who received alert:  Clydine Parkison, RN  MD notified (1st page):  Schorr  Time of first page:  [phone]  MD notified (2nd page):  Time of second page:  Responding MD:    Time MD responded:

## 2017-02-26 NOTE — Progress Notes (Signed)
PHARMACY NOTE:  ANTIMICROBIAL RENAL DOSAGE ADJUSTMENT  Current antimicrobial regimen includes a mismatch between antimicrobial dosage and estimated renal function.  As per policy approved by the Pharmacy & Therapeutics and Medical Executive Committees, the antimicrobial dosage will be adjusted accordingly.  Current antimicrobial dosage:  Meropenem 1g IV q12h  Indication: sepsis secondary to ESBL E.coli bacteremia, HCAP, and UTI  Renal Function:  Estimated Creatinine Clearance: 52.3 mL/min (by C-G formula based on SCr of 1.08 mg/dL). []      On intermittent HD, scheduled: []      On CRRT    Antimicrobial dosage has been changed to:  Meropenem 1g IV q8h   Thank you for allowing pharmacy to be a part of this patient's care.  Jamse MeadGadhia, Dorlis Judice M, [employer]PH 02/26/2017 1:08 PM

## 2017-02-26 NOTE — H&P (Addendum)
Pt with blood in stool and blood coming from penis. NP on call notified. New order placed. Will continue to monitor closely.

## 2017-02-26 NOTE — Progress Notes (Signed)
PROGRESS NOTE [facility]   [place]   ZOX:[id number] DOB: 10/03/1933  DOA: 02/20/2017 PCP: Pete Glatterawn T Langeland, MD   Brief Narrative:  83-year-old male with history of dementia, hypertension, chronic bladder outlet obstruction with chronic foley cath, presenting with altered mental status from nursing home. Patient was unable to give history giving confusion and was admitted for UTI, AKI and lethargy. Foley catheter was exchange placed and noted to drain purulent material. Subsequently patient's culture were positive for ESBL. Patient has not been responding well to medical treatment and palliative care has been consulted  Subjective: Patient seen and examined with son at bedside. Son reported that he is more talkative and more alert today. Although continue with incoherent and confusion.   Assessment & Plan: Sepsis  -Secondary to bacteremia ESBL, UTI and possible PNA prob aspiration.  -Continue Merrem -Lactic acid 2.04>>>1.3 -Urinalysis TNTC WBC -Chest x-ray R>Lbasilar opacities -02/20/17 blood culture = ESBLE.coli -Repeat blood cultures so far no growth   Bacteremia--E. Coli--ESBL -blood culture = E. Coli -source is urine -Discontinue vancomycin -Zosyn changed to Merrem on 3/4  HCAP -concerned about a component of aspiration -continue merrem -Will have speech therapy to re-evaluated.   UTI with indwelling foley catheter -Purulent drainage from foley cath  -Continue abx as above   Acute on chronic renal failure--CKD stage III - improving. Cr on admission 4.7 -Baseline creatinine 1.2-1.3 -Continue IV fluids-->improving  Hypernatremia/Hypokalemia  -Patient on D5 w KCL no significant improvement   Acute encephalopathy -Secondary to acute on chronic renal failure, infectious process and natural deconditioning of dementia  -No much improvement   Chronic bladder outlet obstruction -Foley cath changed in ED on admission  -continue Flomax  Dementia with  behavior disturbance -High risk for sundowning - Risperdal BID  -haldol prn agitation  Sacral and heel decubiti -Present at the time of admission -Woundcare nurse consult appreciated  Protein calorie malnutrition -Nutrition consult once able to take by mouth.  Goal of care  -Discussed with son patient prognosis which is poor in view of no nutrition and severe dementia. -I have consulted palliative care, will continue to treat, treatable condition for now.   DVT prophylaxis: SCD's Code Status: DNR Family Communication: Family at bedside  Disposition Plan: Palliative care vs hospice   Consultants:   Palliative care  Procedures:   None  Antimicrobials:   Merrem    Objective: Vitals:   02/25/17 1358 02/25/17 2043 02/26/17 0548 02/26/17 1257  BP: (!) 158/75 (!) 146/76 (!) 145/77 (!) 150/64  Pulse: (!) 113 (!) 108 96 75  Resp: (!) 26 (!) 24 (!) 24 20  Temp: 99.5 F (37.5 C) 97.5 F (36.4 C) 98.8 F (37.1 C) 98.9 F (37.2 C)  TempSrc: Axillary Axillary Axillary Oral  SpO2: 98% 98% 98% 97%  Weight:      Height:        Intake/Output Summary (Last 24 hours) at 02/26/17 1852 Last data filed at 02/26/17 1600  Gross per 24 hour  Intake          3206.25 ml  Output              600 ml  Net          2606.25 ml   Filed Weights   02/20/17 1400 02/20/17 1800 02/20/17 2200  Weight: 82.6 kg (182 lb) 72 kg (158 lb 11.7 oz) 68.9 kg (151 lb 14.4 oz)    Examination:  General exam: NAD, confuse  Respiratory system: Clear to auscultation. No  wheezes,crackle or rhonchi Cardiovascular system: S1 & S2 heard, RRR. No JVD, murmurs, rubs or gallops Gastrointestinal system: Abdomen is nondistended, soft and nontender.  Central nervous system: Disoriented  Extremities: Edema non pitting b/l LE    Skin: sacral and heel ulcer  Psychiatry: Dementia   Data Reviewed: I have personally reviewed following labs and imaging studies  CBC:  Recent Labs Lab 02/20/17 1337   02/22/17 0341 02/23/17 1207 02/24/17 0522 02/25/17 0643 02/26/17 0539  WBC 26.0*  < > 22.0* 14.2* 13.1* 10.0 12.1*  NEUTROABS 24.4*  --   --   --   --   --   --   HGB 8.0*  < > 9.1* 8.6* 9.0* 8.7* 8.6*  HCT 25.6*  < > 29.0* 28.4* 29.9* 30.2* 29.8*  MCV 75.1*  < > 77.1* 78.7 79.7 80.3 81.6  PLT 340  < > 206 192 220 275 314  < > = values in this interval not displayed. Basic Metabolic Panel:  Recent Labs Lab 02/20/17 1337  02/22/17 0334 02/22/17 0341 02/23/17 0550 02/23/17 1207 02/24/17 0522 02/25/17 0643 02/26/17 0539  NA 147*  < >  --  154*  --  159* 162* 166* 164*  K 4.6  < >  --  3.1*  --  3.2* 3.4* 3.6 3.4*  CL 112*  < >  --  123*  --  128* 130* >130* >130*  CO2 20*  < >  --  22  --  26 26 26 26   GLUCOSE 146*  < >  --  110*  --  124* 126* 99 130*  BUN 100*  < >  --  81*  --  55* 40* 41* 37*  CREATININE 4.97*  < >  --  2.14* 1.50* 1.43* 1.18 1.27* 1.08  CALCIUM 8.5*  < >  --  8.3*  --  8.1* 8.3* 8.0* 8.2*  MG 2.2  --  2.1  --   --   --   --   --   --   < > = values in this interval not displayed. GFR: Estimated Creatinine Clearance: 52.3 mL/min (by C-G formula based on SCr of 1.08 mg/dL). Liver Function Tests:  Recent Labs Lab 02/20/17 1337 02/21/17 0427  AST 45* 36  ALT 24 25  ALKPHOS 32* 31*  BILITOT 0.8 0.7  PROT 6.1* 5.9*  ALBUMIN 2.5* 2.3*   No results for input(s): LIPASE, AMYLASE in the last 168 hours. No results for input(s): AMMONIA in the last 168 hours. Coagulation Profile:  Recent Labs Lab 02/20/17 1337  INR 1.39   Cardiac Enzymes:  Recent Labs Lab 02/20/17 1337 02/20/17 2226 02/21/17 0427  TROPONINI 0.19* 0.10* 0.09*    Recent Labs Lab 02/20/17 1343 02/20/17 1836 02/20/17 2226  LATICACIDVEN 2.04* 1.3 1.8    Recent Results (from the past 240 hour(s))  Blood Culture (routine x 2)     Status: Abnormal   Collection Time: 02/20/17  1:37 PM  Result Value Ref Range Status   Specimen Description BLOOD LEFT WRIST  Final   Special  Requests BOTTLES DRAWN AEROBIC AND ANAEROBIC 5 CC EA  Final   Culture  Setup Time   Final    GRAM NEGATIVE RODS IN BOTH AEROBIC AND ANAEROBIC BOTTLES CRITICAL RESULT CALLED TO, READ BACK BY AND VERIFIED WITH: M. RENZ PHARMD, AT 4098 02/21/17 BY D. VANHOOK    Culture (A)  Final    ESCHERICHIA COLI Confirmed Extended Spectrum Beta-Lactamase Producer (ESBL) Performed at [facility]  [facility], [address], [place], [place] [address]    Report Status 02/23/2017 FINAL  Final   Organism ID, Bacteria ESCHERICHIA COLI  Final      Susceptibility   Escherichia coli - MIC*    AMPICILLIN >=32 RESISTANT Resistant     CEFAZOLIN >=64 RESISTANT Resistant     CEFEPIME RESISTANT Resistant     CEFTAZIDIME RESISTANT Resistant     CEFTRIAXONE >=64 RESISTANT Resistant     CIPROFLOXACIN <=0.25 SENSITIVE Sensitive     GENTAMICIN <=1 SENSITIVE Sensitive     IMIPENEM <=0.25 SENSITIVE Sensitive     TRIMETH/SULFA >=320 RESISTANT Resistant     AMPICILLIN/SULBACTAM >=32 RESISTANT Resistant     PIP/TAZO 8 SENSITIVE Sensitive     Extended ESBL POSITIVE Resistant     * ESCHERICHIA COLI  Blood Culture ID Panel (Reflexed)     Status: Abnormal   Collection Time: 02/20/17  1:37 PM  Result Value Ref Range Status   Enterococcus species NOT DETECTED NOT DETECTED Final   Listeria monocytogenes NOT DETECTED NOT DETECTED Final   Staphylococcus species NOT DETECTED NOT DETECTED Final   Staphylococcus aureus NOT DETECTED NOT DETECTED Final   Streptococcus species NOT DETECTED NOT DETECTED Final   Streptococcus agalactiae NOT DETECTED NOT DETECTED Final   Streptococcus pneumoniae NOT DETECTED NOT DETECTED Final   Streptococcus pyogenes NOT DETECTED NOT DETECTED Final   Acinetobacter baumannii NOT DETECTED NOT DETECTED Final   Enterobacteriaceae species DETECTED (A) NOT DETECTED Final    Comment: Enterobacteriaceae represent a large family of gram-negative bacteria, not a single organism. CRITICAL RESULT CALLED TO, READ  BACK BY AND VERIFIED WITH: M. RENZ PHARMD, AT 6045 02/21/17 BY D. VANHOOK    Enterobacter cloacae complex NOT DETECTED NOT DETECTED Final   Escherichia coli DETECTED (A) NOT DETECTED Final    Comment: CRITICAL RESULT CALLED TO, READ BACK BY AND VERIFIED WITH: M. RENZ PHARMD, AT 4098 02/21/17 BY D. VANHOOK    Klebsiella oxytoca NOT DETECTED NOT DETECTED Final   Klebsiella pneumoniae NOT DETECTED NOT DETECTED Final   Proteus species NOT DETECTED NOT DETECTED Final   Serratia marcescens NOT DETECTED NOT DETECTED Final   Carbapenem resistance NOT DETECTED NOT DETECTED Final   Haemophilus influenzae NOT DETECTED NOT DETECTED Final   Neisseria meningitidis NOT DETECTED NOT DETECTED Final   Pseudomonas aeruginosa NOT DETECTED NOT DETECTED Final   Candida albicans NOT DETECTED NOT DETECTED Final   Candida glabrata NOT DETECTED NOT DETECTED Final   Candida krusei NOT DETECTED NOT DETECTED Final   Candida parapsilosis NOT DETECTED NOT DETECTED Final   Candida tropicalis NOT DETECTED NOT DETECTED Final    Comment: Performed at [facility], [address], [place], [place] [address]  Blood Culture (routine x 2)     Status: Abnormal   Collection Time: 02/20/17  1:57 PM  Result Value Ref Range Status   Specimen Description BLOOD BLOOD RIGHT FOREARM  Final   Special Requests BOTTLES DRAWN AEROBIC AND ANAEROBIC 5 CC EA  Final   Culture  Setup Time   Final    GRAM NEGATIVE RODS IN BOTH AEROBIC AND ANAEROBIC BOTTLES CRITICAL VALUE NOTED.  VALUE IS CONSISTENT WITH PREVIOUSLY REPORTED AND CALLED VALUE.    Culture (A)  Final    ESCHERICHIA COLI SUSCEPTIBILITIES PERFORMED ON PREVIOUS CULTURE WITHIN THE LAST 5 DAYS. Performed at [facility], [address] [place], [place] [address]    Report Status 02/23/2017 FINAL  Final  MRSA  PCR Screening     Status: Abnormal   Collection Time: 02/20/17  6:00 PM  Result Value Ref Range Status   MRSA by PCR POSITIVE (A) NEGATIVE Final    Comment:         The GeneXpert MRSA Assay (FDA approved for NASAL specimens only), is one component of a comprehensive MRSA colonization surveillance program. It is not intended to diagnose MRSA infection nor to guide or monitor treatment for MRSA infections. RESULT CALLED TO, READ BACK BY AND VERIFIED WITH: ARMSTRONG,S AT 1959 ON 02/20/2017 BY MOSLEY,J   Urine culture     Status: Abnormal   Collection Time: 02/20/17  9:30 PM  Result Value Ref Range Status   Specimen Description URINE, CLEAN CATCH  Final   Special Requests Normal  Final   Culture (A)  Final    >=100,000 COLONIES/mL ESCHERICHIA COLI Confirmed Extended Spectrum Beta-Lactamase Producer (ESBL) Performed at [facility], [address], [place], [place] [address]    Report Status 02/23/2017 FINAL  Final   Organism ID, Bacteria ESCHERICHIA COLI (A)  Final      Susceptibility   Escherichia coli - MIC*    AMPICILLIN >=32 RESISTANT Resistant     CEFAZOLIN >=64 RESISTANT Resistant     CEFTRIAXONE >=64 RESISTANT Resistant     CIPROFLOXACIN <=0.25 SENSITIVE Sensitive     GENTAMICIN <=1 SENSITIVE Sensitive     IMIPENEM <=0.25 SENSITIVE Sensitive     NITROFURANTOIN <=16 SENSITIVE Sensitive     TRIMETH/SULFA >=320 RESISTANT Resistant     AMPICILLIN/SULBACTAM >=32 RESISTANT Resistant     PIP/TAZO 8 SENSITIVE Sensitive     Extended ESBL POSITIVE Resistant     * >=100,000 COLONIES/mL ESCHERICHIA COLI  Culture, blood (Routine X 2) w Reflex to ID Panel     Status: None (Preliminary result)   Collection Time: 02/25/17  2:55 PM  Result Value Ref Range Status   Specimen Description BLOOD RIGHT ANTECUBITAL  Final   Special Requests BOTTLES DRAWN AEROBIC ONLY 5 CC  Final   Culture   Final    NO GROWTH < 24 HOURS Performed at [facility], [address], [place], [place] [address]    Report Status PENDING  Incomplete  Culture, blood (Routine X 2) w Reflex to ID Panel     Status: None (Preliminary result)   Collection Time:  02/25/17  3:01 PM  Result Value Ref Range Status   Specimen Description BLOOD RIGHT ANTECUBITAL  Final   Special Requests IN PEDIATRIC BOTTLE 3 CC  Final   Culture   Final    NO GROWTH < 24 HOURS Performed at [facility], [address] [place], [place] [address]    Report Status PENDING  Incomplete  Urine culture     Status: Abnormal   Collection Time: 02/25/17  4:46 PM  Result Value Ref Range Status   Specimen Description URINE, CATHETERIZED  Final   Special Requests NONE  Final   Culture >=100,000 COLONIES/mL YEAST (A)  Final   Report Status 02/26/2017 FINAL  Final     Radiology Studies: Dg Chest Port 1 View  Result Date: 02/25/2017 CLINICAL DATA:  Follow-up aspiration pneumonia EXAM: PORTABLE CHEST 1 VIEW COMPARISON:  02/20/2017 FINDINGS: Cardiomediastinal silhouette is stable. Persistent basilar hazy infiltrate/pneumonia right greater than left. Probable small right pleural effusion. Surgical clips in right axilla again noted. No convincing pulmonary edema. IMPRESSION: Persistent basilar hazy infiltrate/pneumonia right greater than left. Probable small right pleural effusion.  Surgical clips in right axilla again noted. No convincing pulmonary edema. Electronically Signed   By: Natasha Mead M.D.   On: 02/25/2017 14:42    Scheduled Meds: . chlorhexidine  15 mL Mouth Rinse BID  . feeding supplement (ENSURE ENLIVE)  237 mL Oral BID BM  . mouth rinse  15 mL Mouth Rinse q12n4p  . meropenem (MERREM) IV  1 g Intravenous Q8H  . pantoprazole  40 mg Oral Daily  . tamsulosin  0.4 mg Oral Daily   Continuous Infusions: . dextrose 5 % with KCl 20 mEq / L 20 mEq (02/26/17 1503)     LOS: 6 days    Time spent: Total of 50 minutes spent with pt, greater than 50% of which was spent in discussion of  treatment, counseling and coordination of care    Latrelle Dodrill, MD Pager: Text Page via [email or web address]  [phone]  If 7PM-7AM, please contact night-coverage [email or web address] Password  [facility] 02/26/2017, 6:52 PM

## 2017-02-26 NOTE — Progress Notes (Signed)
CSW following for return to [facility] when medically ready. CSW has completed FL2 & will continue to follow and assist with return.   CSW also reviewed notes indicating possible residential hospice, though no palliative consult ordered.   Lincoln MaxinKelly Fany Cavanaugh, LCSW [facility] Clinical Social Worker cell #: [phone]

## 2017-02-27 DIAGNOSIS — Z515 Encounter for palliative care: Secondary | ICD-10-CM

## 2017-02-27 DIAGNOSIS — Z66 Do not resuscitate: Secondary | ICD-10-CM

## 2017-02-27 LAB — CREATININE, SERUM
Creatinine, Ser: 1.07 mg/dL (ref 0.61–1.24)
GFR calc Af Amer: >60 mL/min (ref >60)

## 2017-02-27 LAB — CBC
HEMATOCRIT: 29.4 % — AB (ref 39.0–52.0)
Hemoglobin: 8.5 g/dL — ABNORMAL LOW (ref 13.0–17.0)
MCH: 23.8 pg — AB (ref 26.0–34.0)
MCHC: 28.9 g/dL — ABNORMAL LOW (ref 30.0–36.0)
MCV: 82.4 fL (ref 78.0–100.0)
Platelets: 319 10*3/uL (ref 150–400)
RBC: 3.57 MIL/uL — AB (ref 4.22–5.81)
RDW: 17.5 % — ABNORMAL HIGH (ref 11.5–15.5)
WBC: 13 10*3/uL — ABNORMAL HIGH (ref 4.0–10.5)

## 2017-02-27 LAB — BASIC METABOLIC PANEL
BUN: 35 mg/dL — ABNORMAL HIGH (ref 6–20)
CALCIUM: 8.2 mg/dL — AB (ref 8.9–10.3)
CO2: 26 mmol/L (ref 22–32)
Chloride: >130 mmol/L (ref 101–111)
Creatinine, Ser: 1.08 mg/dL (ref 0.61–1.24)
GFR calc non Af Amer: >60 mL/min (ref >60)
Glucose, Bld: 105 mg/dL — ABNORMAL HIGH (ref 65–99)
POTASSIUM: 3.6 mmol/L (ref 3.5–5.1)
Sodium: 162 mmol/L (ref 135–145)

## 2017-02-27 MED ORDER — LORAZEPAM 1 MG PO TABS: 1.0000 mg | ORAL_TABLET | ORAL | Status: DC | PRN

## 2017-02-27 MED ORDER — HALOPERIDOL LACTATE 5 MG/ML IJ SOLN: 5.0000 mg | Freq: Four times a day (QID) | INTRAMUSCULAR | Status: AC | PRN

## 2017-02-27 MED ORDER — MORPHINE SULFATE (CONCENTRATE) 10 MG/0.5ML PO SOLN: 5.0000 mg | ORAL | Status: AC | PRN

## 2017-02-27 MED ORDER — LORAZEPAM 1 MG PO TABS: 1.0000 mg | ORAL_TABLET | ORAL | 0 refills | Status: AC | PRN

## 2017-02-27 MED ORDER — MORPHINE SULFATE (CONCENTRATE) 10 MG/0.5ML PO SOLN: 5.0000 mg | ORAL | Status: DC | PRN

## 2017-02-27 NOTE — Progress Notes (Signed)
CSW received consult for residential hospice placement. CSW met with patient's son, Roderic Palau at bedside who states that he lives in [place] and would prefer [facility] or [facility]. CSW made referral to Harmon Pier at [place] [facility]. Currently, no availability at [facility], awaiting response from [facility] re: bed availability.    Raynaldo Opitz, [facility] Clinical Social Worker cell #: [phone]

## 2017-02-27 NOTE — Progress Notes (Signed)
[facility] requesting IV remain in place. Patient will D/C with IV.

## 2017-02-27 NOTE — Progress Notes (Signed)
Report given to Marcelino DusterMichelle, Charity fundraiserN at [facility].

## 2017-02-27 NOTE — Progress Notes (Signed)
Patient is set to discharge to [facility] today. Patient's son, Christiane HaJonathan made aware. Discharge packet given to RN, Luanna ColeBriana. [facility] called for transport.     Lincoln MaxinKelly Lainey Nelson, LCSW [facility] Clinical Social Worker cell #: [phone]

## 2017-02-27 NOTE — Progress Notes (Signed)
Plan for d/c to [facility], discharge planning per CSW. [phone]

## 2017-02-27 NOTE — Discharge Summary (Signed)
Physician Discharge Summary  [place]  ZOX:[id number]  DOB: Aug 29, 1933  DOA: 02/20/2017 PCP: Pete Glatter, MD  Admit date: 02/20/2017 Discharge date: 02/27/2017  Admitted From: SNF Disposition:  Hospice   Recommendations for Outpatient Follow-up:  1. Follow up with PCP in 1-2 weeks 2. Please obtain BMP/CBC in one week 3. Please follow up on the following pending results:  Discharge Condition: Poor - Comfort care  CODE STATUS: DNR  Diet recommendation: Full liquids  Brief/Interim Summary: 83 year old male with a history of dementia, hypertension, chronic bladder outlet obstruction presenting with altered mental status from his SNF.  The patient is unable to provide any significant history. All of this history is obtained from review of his medical record. Apparently, the patient was noted to have increasing confusion over the past 2 days prior to admission by nursing staff at his SNF. Although the patient has dementia, the patient is normally alert and interactive. Blood work at [facility] noted leukocytosis. Blood cultures were obtained and the patient was given IM ceftriaxone. However, the patient continued to be less responsive. As result, he presented to emergency department for further evaluation. In the ED, the patient was noted to have WBC 26.0 with serum creatinine 4.97. Patient was noted to have large bladder on exam with bladder scan >1L - foley exchanged with drainage of purulent, yellow urine.  The patient was started on IV fluids and intravenous antibiotics after cultures were obtained. The patient was ultimately changed to meropenem after urine and blood culture showed ESBL Escherichia coli. The patient continued to be fluid resuscitated. Unfortunately, there was very little to no improvement in the patient's mental and clinical status despite optimal medical therapy. The case was discussed with the patient's son on numerous occasions. After numerous days of optimal medical therapy and  essentially minimal to no clinical improvement. Palliative care was consulted and prognosis was discussed, the patient's son decided to change the patient's focus of care to that focused on full comfort. Social work was consulted to assist in transition to residential hospice.   Subjective: Patient seen and examined, no significant improvement since yesterday. Continues to be confuse and not following commands. Electrolyte imbalance not responding to treatment.   Discharge Diagnoses/Hospital Course:  Sepsis 2/2 bacteremia due to E. Coli ESBL  -Secondary to bacteremia ESBL, UTI and possible PNA prob aspiration.  -Initially treated with Zosyn and vancomycin, subsequently changed to Merrem  -Lactic acid 2.04>>>1.3 -Urinalysis TNTC WBC -Chest x-ray R>Lbasilar opacities -02/20/17 blood culture = ESBLE.coli -Repeat blood cultures so far no growth  -Patient status change to full comfort care - will d/c antibiotics   HCAP -concerned about a component of aspiration -continue merrem -No longer active issue patient focus of care is full comfort   UTI with indwelling foley catheter -Purulent drainage from foley cath  -Treated with abx as per above   Acute on chronic renal failure--CKD stage III - Cr on admission 4.7 - resolved with IVF  -Baseline creatinine 1.2-1.3  Hypernatremia/Hypokalemia  -Patient was treated with D5 w KCL and no significant improvement was noted  -No longer active issue patient focus of care is full comfort   Acute encephalopathy -Secondary to acute on chronic renal failure, infectious process and natural deconditioning of dementia  -No much improvement after treating metabolic and infectious conditions  -Comfort care   Chronic bladder outlet obstruction -Foley cath changed in ED on admission  -No longer active issue patient focus of care is full comfort   Dementia with behavior  disturbance -Haldol PRN   Sacral and heel decubiti -Present at the time of  admission -No further treatment as patient is comfort care   Protein calorie malnutrition -Clear liquids diet   Goal of care  -After palliative care discussion family have decide to place patient on full comfort measures -Will start morphine PRN, for breathing control  -O2 supplement as needed -Haldol for agitation  Discharge Instructions  You were cared for by a hospitalist during your hospital stay. If you have any questions about your discharge medications or the care you received while you were in the hospital after you are discharged, you can call the unit and asked to speak with the hospitalist on call if the hospitalist that took care of you is not available. Once you are discharged, your primary care physician will handle any further medical issues. Please note that NO REFILLS for any discharge medications will be authorized once you are discharged, as it is imperative that you return to your primary care physician (or establish a relationship with a primary care physician if you do not have one) for your aftercare needs so that they can reassess your need for medications and monitor your lab values.  Discharge Instructions    Call MD for:  difficulty breathing, headache or visual disturbances    Complete by:  As directed    Call MD for:  extreme fatigue    Complete by:  As directed    Call MD for:  hives    Complete by:  As directed    Call MD for:  persistant dizziness or light-headedness    Complete by:  As directed    Call MD for:  persistant nausea and vomiting    Complete by:  As directed    Call MD for:  redness, tenderness, or signs of infection (pain, swelling, redness, odor or green/yellow discharge around incision site)    Complete by:  As directed    Call MD for:  severe uncontrolled pain    Complete by:  As directed    Call MD for:  temperature >100.4    Complete by:  As directed    Diet general    Complete by:  As directed    Full liquids   Increase  activity slowly    Complete by:  As directed      Allergies as of 02/27/2017      Reactions   Codeine Anxiety   Hydroxyzine Other (See Comments)   Makes things worse   Seroquel [quetiapine] Other (See Comments)   Makes things worse      Medication List    STOP taking these medications   amLODipine 5 MG tablet Commonly known as:  NORVASC   aspirin EC 81 MG tablet   atenolol 25 MG tablet Commonly known as:  TENORMIN   bisacodyl 5 MG EC tablet Commonly known as:  DULCOLAX   LORazepam 2 MG/ML injection Commonly known as:  ATIVAN Replaced by:  LORazepam 1 MG tablet   multivitamin with minerals Tabs tablet   risperiDONE 0.5 MG tablet Commonly known as:  RISPERDAL   ROCEPHIN 1 g injection Generic drug:  cefTRIAXone   tamsulosin 0.4 MG Caps capsule Commonly known as:  FLOMAX     TAKE these medications   feeding supplement (PRO-STAT SUGAR FREE 64) Liqd Take 30 mLs by mouth 2 (two) times daily.   haloperidol lactate 5 MG/ML injection Commonly known as:  HALDOL Inject 1 mL (5 mg total) into the vein  every 6 (six) hours as needed.   LORazepam 1 MG tablet Commonly known as:  ATIVAN Take 1 tablet (1 mg total) by mouth every 4 (four) hours as needed for anxiety or sleep. Replaces:  LORazepam 2 MG/ML injection   morphine CONCENTRATE 10 MG/0.5ML Soln concentrated solution Take 0.25 mLs (5 mg total) by mouth every hour as needed for moderate pain, severe pain or shortness of breath.   NUTRITIONAL DRINK Liqd Take 90 mLs by mouth daily. *House Supplement 2.0*       Allergies  Allergen Reactions  . Codeine Anxiety  . Hydroxyzine Other (See Comments)    Makes things worse  . Seroquel [Quetiapine] Other (See Comments)    Makes things worse    Consultations:  Palliative care    Procedures/Studies: US Renal  Result Date: 01/30/2017 CLINICAL DATA:  Acute kidney injury. EXAM: RENAL / URINARY TRACT ULTRASOUND COMPLETE COMPARISON:  None. FINDINGS: Right Kidney:  Length: 11.2 cm. Echogenicity is increased. Moderate hydronephrosis visualized. Left Kidney: Length: 10.8. Echogenicity is increased. Moderate hydronephrosis visualized. Bladder: Irregular, trabeculated appearance of the bladder wall is seen. IMPRESSION: Moderate bilateral hydronephrosis. Increased cortical echogenicity bilaterally consistent with medical renal disease. Trabeculated appearance of the urinary bladder wall suggestive of bladder outlet obstruction. Electronically Signed   By: Drusilla Kanner M.D.   On: 01/30/2017 09:26   Dg Chest Port 1 View  Result Date: 02/25/2017 CLINICAL DATA:  Follow-up aspiration pneumonia EXAM: PORTABLE CHEST 1 VIEW COMPARISON:  02/20/2017 FINDINGS: Cardiomediastinal silhouette is stable. Persistent basilar hazy infiltrate/pneumonia right greater than left. Probable small right pleural effusion. Surgical clips in right axilla again noted. No convincing pulmonary edema. IMPRESSION: Persistent basilar hazy infiltrate/pneumonia right greater than left. Probable small right pleural effusion. Surgical clips in right axilla again noted. No convincing pulmonary edema. Electronically Signed   By: Natasha Mead M.D.   On: 02/25/2017 14:42   Dg Chest Port 1 View  Result Date: 02/20/2017 CLINICAL DATA:  Cough. Altered mental status for 1 day. Leukocytosis. EXAM: PORTABLE CHEST 1 VIEW COMPARISON:  None. FINDINGS: Hazy opacity of the right more than left bases, primarily concerning for pneumonia in this setting. No edema, effusion, or pneumothorax. Normal heart size and mediastinal contours. IMPRESSION: Opacities at the right greater than left bases primarily concerning for pneumonia or aspiration in this setting. Electronically Signed   By: Marnee Spring M.D.   On: 02/20/2017 14:15      Discharge Exam: Vitals:   02/26/17 2034 02/27/17 0405  BP: 134/88 (!) 149/92  Pulse: 96 86  Resp: 20 18  Temp: 98.1 F (36.7 C) 97.8 F (36.6 C)   Vitals:   02/26/17 0548 02/26/17 1257  02/26/17 2034 02/27/17 0405  BP: (!) 145/77 (!) 150/64 134/88 (!) 149/92  Pulse: 96 75 96 86  Resp: (!) 24 20 20 18   Temp: 98.8 F (37.1 C) 98.9 F (37.2 C) 98.1 F (36.7 C) 97.8 F (36.6 C)  TempSrc: Axillary Oral Axillary Axillary  SpO2: 98% 97% 99% 100%  Weight:      Height:        General: Confuse, disoriented and lethargic  Cardiovascular: RRR, S1/S2 +, no rubs, no gallops Respiratory: Good air entry anterior chest  Abdominal: Soft, NT, ND, bowel sounds + Extremities: non pitting edema b/l LE, no cyanosis Skin: sacral and heel pressure ulcer    The results of significant diagnostics from this hospitalization (including imaging, microbiology, ancillary and laboratory) are listed below for reference.     Microbiology: Recent  Results (from the past 240 hour(s))  Blood Culture (routine x 2)     Status: Abnormal   Collection Time: 02/20/17  1:37 PM  Result Value Ref Range Status   Specimen Description BLOOD LEFT WRIST  Final   Special Requests BOTTLES DRAWN AEROBIC AND ANAEROBIC 5 CC EA  Final   Culture  Setup Time   Final    GRAM NEGATIVE RODS IN BOTH AEROBIC AND ANAEROBIC BOTTLES CRITICAL RESULT CALLED TO, READ BACK BY AND VERIFIED WITH: M. RENZ PHARMD, AT [phone] 02/21/17 BY D. VANHOOK    Culture (A)  Final    ESCHERICHIA COLI Confirmed Extended Spectrum Beta-Lactamase Producer (ESBL) Performed at [facility], [address] [place], [place] [address]    Report Status 02/23/2017 FINAL  Final   Organism ID, Bacteria ESCHERICHIA COLI  Final      Susceptibility   Escherichia coli - MIC*    AMPICILLIN >=32 RESISTANT Resistant     CEFAZOLIN >=64 RESISTANT Resistant     CEFEPIME RESISTANT Resistant     CEFTAZIDIME RESISTANT Resistant     CEFTRIAXONE >=64 RESISTANT Resistant     CIPROFLOXACIN <=0.25 SENSITIVE Sensitive     GENTAMICIN <=1 SENSITIVE Sensitive     IMIPENEM <=0.25 SENSITIVE Sensitive     TRIMETH/SULFA >=320 RESISTANT Resistant      AMPICILLIN/SULBACTAM >=32 RESISTANT Resistant     PIP/TAZO 8 SENSITIVE Sensitive     Extended ESBL POSITIVE Resistant     * ESCHERICHIA COLI  Blood Culture ID Panel (Reflexed)     Status: Abnormal   Collection Time: 02/20/17  1:37 PM  Result Value Ref Range Status   Enterococcus species NOT DETECTED NOT DETECTED Final   Listeria monocytogenes NOT DETECTED NOT DETECTED Final   Staphylococcus species NOT DETECTED NOT DETECTED Final   Staphylococcus aureus NOT DETECTED NOT DETECTED Final   Streptococcus species NOT DETECTED NOT DETECTED Final   Streptococcus agalactiae NOT DETECTED NOT DETECTED Final   Streptococcus pneumoniae NOT DETECTED NOT DETECTED Final   Streptococcus pyogenes NOT DETECTED NOT DETECTED Final   Acinetobacter baumannii NOT DETECTED NOT DETECTED Final   Enterobacteriaceae species DETECTED (A) NOT DETECTED Final    Comment: Enterobacteriaceae represent a large family of gram-negative bacteria, not a single organism. CRITICAL RESULT CALLED TO, READ BACK BY AND VERIFIED WITH: M. RENZ PHARMD, AT [phone] 02/21/17 BY D. VANHOOK    Enterobacter cloacae complex NOT DETECTED NOT DETECTED Final   Escherichia coli DETECTED (A) NOT DETECTED Final    Comment: CRITICAL RESULT CALLED TO, READ BACK BY AND VERIFIED WITH: M. RENZ PHARMD, AT [phone] 02/21/17 BY D. VANHOOK    Klebsiella oxytoca NOT DETECTED NOT DETECTED Final   Klebsiella pneumoniae NOT DETECTED NOT DETECTED Final   Proteus species NOT DETECTED NOT DETECTED Final   Serratia marcescens NOT DETECTED NOT DETECTED Final   Carbapenem resistance NOT DETECTED NOT DETECTED Final   Haemophilus influenzae NOT DETECTED NOT DETECTED Final   Neisseria meningitidis NOT DETECTED NOT DETECTED Final   Pseudomonas aeruginosa NOT DETECTED NOT DETECTED Final   Candida albicans NOT DETECTED NOT DETECTED Final   Candida glabrata NOT DETECTED NOT DETECTED Final   Candida krusei NOT DETECTED NOT DETECTED Final   Candida parapsilosis NOT DETECTED NOT  DETECTED Final   Candida tropicalis NOT DETECTED NOT DETECTED Final    Comment: Performed at [facility], [address] [place], [place] [address]  Blood Culture (routine x 2)     Status: Abnormal  Collection Time: 02/20/17  1:57 PM  Result Value Ref Range Status   Specimen Description BLOOD BLOOD RIGHT FOREARM  Final   Special Requests BOTTLES DRAWN AEROBIC AND ANAEROBIC 5 CC EA  Final   Culture  Setup Time   Final    GRAM NEGATIVE RODS IN BOTH AEROBIC AND ANAEROBIC BOTTLES CRITICAL VALUE NOTED.  VALUE IS CONSISTENT WITH PREVIOUSLY REPORTED AND CALLED VALUE.    Culture (A)  Final    ESCHERICHIA COLI SUSCEPTIBILITIES PERFORMED ON PREVIOUS CULTURE WITHIN THE LAST 5 DAYS. Performed at [facility], [address] [place], [place] [address]    Report Status 02/23/2017 FINAL  Final  MRSA PCR Screening     Status: Abnormal   Collection Time: 02/20/17  6:00 PM  Result Value Ref Range Status   MRSA by PCR POSITIVE (A) NEGATIVE Final    Comment:        The GeneXpert MRSA Assay (FDA approved for NASAL specimens only), is one component of a comprehensive MRSA colonization surveillance program. It is not intended to diagnose MRSA infection nor to guide or monitor treatment for MRSA infections. RESULT CALLED TO, READ BACK BY AND VERIFIED WITH: ARMSTRONG,S AT 1959 ON 02/20/2017 BY MOSLEY,J   Urine culture     Status: Abnormal   Collection Time: 02/20/17  9:30 PM  Result Value Ref Range Status   Specimen Description URINE, CLEAN CATCH  Final   Special Requests Normal  Final   Culture (A)  Final    >=100,000 COLONIES/mL ESCHERICHIA COLI Confirmed Extended Spectrum Beta-Lactamase Producer (ESBL) Performed at [facility], [address] [place], [place] [address]    Report Status 02/23/2017 FINAL  Final   Organism ID, Bacteria ESCHERICHIA COLI (A)  Final      Susceptibility   Escherichia coli - MIC*    AMPICILLIN >=32 RESISTANT Resistant     CEFAZOLIN >=64  RESISTANT Resistant     CEFTRIAXONE >=64 RESISTANT Resistant     CIPROFLOXACIN <=0.25 SENSITIVE Sensitive     GENTAMICIN <=1 SENSITIVE Sensitive     IMIPENEM <=0.25 SENSITIVE Sensitive     NITROFURANTOIN <=16 SENSITIVE Sensitive     TRIMETH/SULFA >=320 RESISTANT Resistant     AMPICILLIN/SULBACTAM >=32 RESISTANT Resistant     PIP/TAZO 8 SENSITIVE Sensitive     Extended ESBL POSITIVE Resistant     * >=100,000 COLONIES/mL ESCHERICHIA COLI  Culture, blood (Routine X 2) w Reflex to ID Panel     Status: None (Preliminary result)   Collection Time: 02/25/17  2:55 PM  Result Value Ref Range Status   Specimen Description BLOOD RIGHT ANTECUBITAL  Final   Special Requests BOTTLES DRAWN AEROBIC ONLY 5 CC  Final   Culture   Final    NO GROWTH 2 DAYS Performed at [facility], [address] [place], [place] [address]    Report Status PENDING  Incomplete  Culture, blood (Routine X 2) w Reflex to ID Panel     Status: None (Preliminary result)   Collection Time: 02/25/17  3:01 PM  Result Value Ref Range Status   Specimen Description BLOOD RIGHT ANTECUBITAL  Final   Special Requests IN PEDIATRIC BOTTLE 3 CC  Final   Culture   Final    NO GROWTH 2 DAYS Performed at [facility], [address] [place], [place] [address]    Report Status PENDING  Incomplete  Urine culture     Status: Abnormal   Collection Time: 02/25/17  4:46 PM  Result Value Ref Range Status   Specimen Description URINE, CATHETERIZED  Final   Special Requests NONE  Final   Culture >=100,000 COLONIES/mL YEAST (A)  Final   Report Status 02/26/2017 FINAL  Final     Labs: BNP (last 3 results)  Recent Labs  11/25/16 1508  BNP 149.5*   Basic Metabolic Panel:  Recent Labs Lab 02/22/17 0334  02/23/17 1207 02/24/17 0522 02/25/17 0643 02/26/17 0539 02/27/17 0519  NA  --   < > 159* 162* 166* 164* 162*  K  --   < > 3.2* 3.4* 3.6 3.4* 3.6  CL  --   < > 128* 130* >130* >130* >130*  CO2  --   < > 26 26 26 26  26   GLUCOSE  --   < > 124* 126* 99 130* 105*  BUN  --   < > 55* 40* 41* 37* 35*  CREATININE  --   < > 1.43* 1.18 1.27* 1.08 1.08  1.07  CALCIUM  --   < > 8.1* 8.3* 8.0* 8.2* 8.2*  MG 2.1  --   --   --   --   --   --   < > = values in this interval not displayed. Liver Function Tests:  Recent Labs Lab 02/21/17 0427  AST 36  ALT 25  ALKPHOS 31*  BILITOT 0.7  PROT 5.9*  ALBUMIN 2.3*   No results for input(s): LIPASE, AMYLASE in the last 168 hours. No results for input(s): AMMONIA in the last 168 hours. CBC:  Recent Labs Lab 02/23/17 1207 02/24/17 0522 02/25/17 0643 02/26/17 0539 02/27/17 0519  WBC 14.2* 13.1* 10.0 12.1* 13.0*  HGB 8.6* 9.0* 8.7* 8.6* 8.5*  HCT 28.4* 29.9* 30.2* 29.8* 29.4*  MCV 78.7 79.7 80.3 81.6 82.4  PLT 192 220 275 314 319   Cardiac Enzymes:  Recent Labs Lab 02/20/17 2226 02/21/17 0427  TROPONINI 0.10* 0.09*   BNP: Invalid input(s): POCBNP CBG: No results for input(s): GLUCAP in the last 168 hours. D-Dimer No results for input(s): DDIMER in the last 72 hours. Hgb A1c No results for input(s): HGBA1C in the last 72 hours. Lipid Profile No results for input(s): CHOL, HDL, LDLCALC, TRIG, CHOLHDL, LDLDIRECT in the last 72 hours. Thyroid function studies No results for input(s): TSH, T4TOTAL, T3FREE, THYROIDAB in the last 72 hours.  Invalid input(s): FREET3 Anemia work up No results for input(s): VITAMINB12, FOLATE, FERRITIN, TIBC, IRON, RETICCTPCT in the last 72 hours. Urinalysis    Component Value Date/Time   COLORURINE YELLOW 02/25/2017 1646   APPEARANCEUR CLOUDY (A) 02/25/2017 1646   LABSPEC 1.014 02/25/2017 1646   PHURINE 5.0 02/25/2017 1646   GLUCOSEU NEGATIVE 02/25/2017 1646   HGBUR SMALL (A) 02/25/2017 1646   BILIRUBINUR NEGATIVE 02/25/2017 1646   KETONESUR NEGATIVE 02/25/2017 1646   PROTEINUR NEGATIVE 02/25/2017 1646   NITRITE NEGATIVE 02/25/2017 1646   LEUKOCYTESUR LARGE (A) 02/25/2017 1646   Sepsis Labs Invalid  input(s): PROCALCITONIN,  WBC,  LACTICIDVEN Microbiology Recent Results (from the past 240 hour(s))  Blood Culture (routine x 2)     Status: Abnormal   Collection Time: 02/20/17  1:37 PM  Result Value Ref Range Status   Specimen Description BLOOD LEFT WRIST  Final   Special Requests BOTTLES DRAWN AEROBIC AND ANAEROBIC 5 CC EA  Final   Culture  Setup Time   Final    GRAM NEGATIVE RODS IN BOTH AEROBIC AND ANAEROBIC BOTTLES CRITICAL RESULT CALLED TO, READ  BACK BY AND VERIFIED WITH: M. RENZ PHARMD, AT 1610 02/21/17 BY D. VANHOOK    Culture (A)  Final    ESCHERICHIA COLI Confirmed Extended Spectrum Beta-Lactamase Producer (ESBL) Performed at [facility], [address] [place], [place] [address]    Report Status 02/23/2017 FINAL  Final   Organism ID, Bacteria ESCHERICHIA COLI  Final      Susceptibility   Escherichia coli - MIC*    AMPICILLIN >=32 RESISTANT Resistant     CEFAZOLIN >=64 RESISTANT Resistant     CEFEPIME RESISTANT Resistant     CEFTAZIDIME RESISTANT Resistant     CEFTRIAXONE >=64 RESISTANT Resistant     CIPROFLOXACIN <=0.25 SENSITIVE Sensitive     GENTAMICIN <=1 SENSITIVE Sensitive     IMIPENEM <=0.25 SENSITIVE Sensitive     TRIMETH/SULFA >=320 RESISTANT Resistant     AMPICILLIN/SULBACTAM >=32 RESISTANT Resistant     PIP/TAZO 8 SENSITIVE Sensitive     Extended ESBL POSITIVE Resistant     * ESCHERICHIA COLI  Blood Culture ID Panel (Reflexed)     Status: Abnormal   Collection Time: 02/20/17  1:37 PM  Result Value Ref Range Status   Enterococcus species NOT DETECTED NOT DETECTED Final   Listeria monocytogenes NOT DETECTED NOT DETECTED Final   Staphylococcus species NOT DETECTED NOT DETECTED Final   Staphylococcus aureus NOT DETECTED NOT DETECTED Final   Streptococcus species NOT DETECTED NOT DETECTED Final   Streptococcus agalactiae NOT DETECTED NOT DETECTED Final   Streptococcus pneumoniae NOT DETECTED NOT DETECTED Final   Streptococcus pyogenes NOT DETECTED  NOT DETECTED Final   Acinetobacter baumannii NOT DETECTED NOT DETECTED Final   Enterobacteriaceae species DETECTED (A) NOT DETECTED Final    Comment: Enterobacteriaceae represent a large family of gram-negative bacteria, not a single organism. CRITICAL RESULT CALLED TO, READ BACK BY AND VERIFIED WITH: M. RENZ PHARMD, AT 4098 02/21/17 BY D. VANHOOK    Enterobacter cloacae complex NOT DETECTED NOT DETECTED Final   Escherichia coli DETECTED (A) NOT DETECTED Final    Comment: CRITICAL RESULT CALLED TO, READ BACK BY AND VERIFIED WITH: M. RENZ PHARMD, AT 1191 02/21/17 BY D. VANHOOK    Klebsiella oxytoca NOT DETECTED NOT DETECTED Final   Klebsiella pneumoniae NOT DETECTED NOT DETECTED Final   Proteus species NOT DETECTED NOT DETECTED Final   Serratia marcescens NOT DETECTED NOT DETECTED Final   Carbapenem resistance NOT DETECTED NOT DETECTED Final   Haemophilus influenzae NOT DETECTED NOT DETECTED Final   Neisseria meningitidis NOT DETECTED NOT DETECTED Final   Pseudomonas aeruginosa NOT DETECTED NOT DETECTED Final   Candida albicans NOT DETECTED NOT DETECTED Final   Candida glabrata NOT DETECTED NOT DETECTED Final   Candida krusei NOT DETECTED NOT DETECTED Final   Candida parapsilosis NOT DETECTED NOT DETECTED Final   Candida tropicalis NOT DETECTED NOT DETECTED Final    Comment: Performed at [facility], [address], [place], [place] [address]  Blood Culture (routine x 2)     Status: Abnormal   Collection Time: 02/20/17  1:57 PM  Result Value Ref Range Status   Specimen Description BLOOD BLOOD RIGHT FOREARM  Final   Special Requests BOTTLES DRAWN AEROBIC AND ANAEROBIC 5 CC EA  Final   Culture  Setup Time   Final    GRAM NEGATIVE RODS IN BOTH AEROBIC AND ANAEROBIC BOTTLES CRITICAL VALUE NOTED.  VALUE IS CONSISTENT WITH PREVIOUSLY REPORTED AND CALLED VALUE.    Culture (A)  Final    ESCHERICHIA  COLI SUSCEPTIBILITIES PERFORMED ON PREVIOUS CULTURE WITHIN THE LAST 5 DAYS. Performed  at [facility], [address], [place], [place] [address]    Report Status 02/23/2017 FINAL  Final  MRSA PCR Screening     Status: Abnormal   Collection Time: 02/20/17  6:00 PM  Result Value Ref Range Status   MRSA by PCR POSITIVE (A) NEGATIVE Final    Comment:        The GeneXpert MRSA Assay (FDA approved for NASAL specimens only), is one component of a comprehensive MRSA colonization surveillance program. It is not intended to diagnose MRSA infection nor to guide or monitor treatment for MRSA infections. RESULT CALLED TO, READ BACK BY AND VERIFIED WITH: ARMSTRONG,S AT 1959 ON 02/20/2017 BY MOSLEY,J   Urine culture     Status: Abnormal   Collection Time: 02/20/17  9:30 PM  Result Value Ref Range Status   Specimen Description URINE, CLEAN CATCH  Final   Special Requests Normal  Final   Culture (A)  Final    >=100,000 COLONIES/mL ESCHERICHIA COLI Confirmed Extended Spectrum Beta-Lactamase Producer (ESBL) Performed at [facility], [address] [place], [place] [address]    Report Status 02/23/2017 FINAL  Final   Organism ID, Bacteria ESCHERICHIA COLI (A)  Final      Susceptibility   Escherichia coli - MIC*    AMPICILLIN >=32 RESISTANT Resistant     CEFAZOLIN >=64 RESISTANT Resistant     CEFTRIAXONE >=64 RESISTANT Resistant     CIPROFLOXACIN <=0.25 SENSITIVE Sensitive     GENTAMICIN <=1 SENSITIVE Sensitive     IMIPENEM <=0.25 SENSITIVE Sensitive     NITROFURANTOIN <=16 SENSITIVE Sensitive     TRIMETH/SULFA >=320 RESISTANT Resistant     AMPICILLIN/SULBACTAM >=32 RESISTANT Resistant     PIP/TAZO 8 SENSITIVE Sensitive     Extended ESBL POSITIVE Resistant     * >=100,000 COLONIES/mL ESCHERICHIA COLI  Culture, blood (Routine X 2) w Reflex to ID Panel     Status: None (Preliminary result)   Collection Time: 02/25/17  2:55 PM  Result Value Ref Range Status   Specimen Description BLOOD RIGHT ANTECUBITAL  Final   Special Requests BOTTLES DRAWN AEROBIC ONLY 5 CC   Final   Culture   Final    NO GROWTH 2 DAYS Performed at [facility], [address] [place], [place] [address]    Report Status PENDING  Incomplete  Culture, blood (Routine X 2) w Reflex to ID Panel     Status: None (Preliminary result)   Collection Time: 02/25/17  3:01 PM  Result Value Ref Range Status   Specimen Description BLOOD RIGHT ANTECUBITAL  Final   Special Requests IN PEDIATRIC BOTTLE 3 CC  Final   Culture   Final    NO GROWTH 2 DAYS Performed at [facility], [address], [place], [place] [address]    Report Status PENDING  Incomplete  Urine culture     Status: Abnormal   Collection Time: 02/25/17  4:46 PM  Result Value Ref Range Status   Specimen Description URINE, CATHETERIZED  Final   Special Requests NONE  Final   Culture >=100,000 COLONIES/mL YEAST (A)  Final   Report Status 02/26/2017 FINAL  Final     Time coordinating discharge: 45 minutes  SIGNED:  Latrelle Dodrill, MD  [facility] 02/27/2017, 3:22 PM  Pager please text page via  [email or web address] Password [email or web address]

## 2017-02-27 NOTE — Progress Notes (Signed)
Nutrition Brief Note  Pt seen for low Braden. Chart reviewed. Palliative Care saw pt this AM and noted CSW states consult for residential hospice placement.  Pt now transitioning to comfort care.  No further nutrition interventions warranted at this time.  Please re-consult as needed.     Trenton GammonJessica Nijah Tejera, MS, RD, LDN, [facility] Inpatient Clinical Dietitian Pager # [phone] After hours/weekend pager # [phone]

## 2017-02-27 NOTE — Consult Note (Signed)
Consultation Note Date: 02/27/2017   Patient Name: Scott Park  DOB: 10/21/1933  MRN: [id number]  Age / Sex: 83 y.o., male  PCP: Pete Glatterawn T Langeland, MD Referring Physician: Lenox PondsEdwin Silva Zapata, MD  Reason for Consultation: Establishing goals of care, Non pain symptom management, Pain control and Psychosocial/spiritual support  HPI/Patient Profile: 83 y.o. male   admitted on 02/20/2017 with  PMHx of dementia, HTN who presents with AMS from SNF/rehab.    According to nurse at  skilled nursing facility via telephone as well as report from EMS, patient has had increasing confusion over the last 2 days. He is normally alert and interactive, though he does have difficulty with orientation due to dementia.   Yesterday, he was noted to be less responsive and was noted to have elevated white blood cell count on labs. Blood cultures were drawn and he was given IM Rocephin. Over the last 24 hours, however, he has had progressively worsening mental status and is less and less responsive.   ED course BP 152/80  Pulse 98  Temp 100.6 F (38.1 C) (Core (Comment)) Comment (Src): temp foley  Resp 16  Wt 182 lb  On arrival, pt tachycardic, confused, altered. Noted to have large bladder on exam with bladder scan >1L - foley malfunctioning, replaced by ER physician, creatinine 5.0. White blood cell count 26,000 Suspected to have severe sepsis 2/2 UTI, c/b urinary obstruction and post-renal AKI. CODE SEPSIS initiated, Vanc/Zosyn given, patient received 2 L of IV normal saline. CXR also c/f aspiration PNA - ABX should cover this.    Family is faced with advanced directive decisions and anticipatory care needs.    Patient never wanted aggressive interventions at EOL.  He is a [place]   Clinical Assessment and Goals of Care:    This NP Lorinda CreedMary Mcclain Shall reviewed medical records, received report from team, assessed the  patient and then meet at the patient's bedside along with his son/ Johnathan to discuss diagnosis, prognosis, GOC, EOL wishes disposition and options.  A detailed discussion was had today regarding advanced directives.  Concepts specific to code status, artifical feeding and hydration, continued IV antibiotics and rehospitalization was had.    Values and goals of care important to patient and family were attempted to be elicited.  MOST form introduced  Concept of Hospice and Palliative Care were discussed  Natural trajectory and expectations at EOL were discussed.  Questions and concerns addressed.   Family encouraged to call with questions or concerns.  PMT will continue to support holistically.  HCPOA/ son/Johnathan Cross    SUMMARY OF RECOMMENDATIONS    Code Status/Advance Care Planning:  DNR   Symptom Management:   Pain/Dyspnea: Roxanol 5 mg po/sl every 1 hr prn  Agitation: Ativan 1 mg po/sl every 1 hr prn  Palliative Prophylaxis:   Aspiration, Bowel Regimen, Frequent Pain Assessment, Oral Care and Palliative Wound Care  Additional Recommendations (Limitations, Scope, Preferences):  Full Comfort Care  Psycho-social/Spiritual:   Desire for further Chaplaincy support:no  Additional Recommendations: Education on  Hospice and Grief/Bereavement Support  Prognosis:   < 2 weeks- stop all life prolonging interventions, focus is comfort  Discharge Planning: Hospice facility      Primary Diagnoses: Present on Admission: . Sepsis (HCC) . HTN (hypertension), benign . AKI (acute kidney injury) (HCC) . Bladder outlet obstruction . Dementia with behavioral disturbance   I have reviewed the medical record, interviewed the patient and family, and examined the patient. The following aspects are pertinent.  Past Medical History:  Diagnosis Date  . Anemia   . Dementia    with behavioral disturbance  . Hydronephrosis   . Hypertension   . SunDown syndrome    Social  History   Social History  . Marital status: Widowed    Spouse name: N/A  . Number of children: 2  . Years of education: 12   Occupational History  .      career Company secretary   Social History Main Topics  . Smoking status: Never Smoker  . Smokeless tobacco: Never Used  . 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 Drug use: No  . Sexual activity: Not Asked   Other Topics Concern  . None   Social History Narrative   Son lives with patient sometimes   Caffeine- none   Family History  Problem Relation Age of Onset  . Cancer Mother   . Cancer Father     prostate   Scheduled Meds: . chlorhexidine  15 mL Mouth Rinse BID  . feeding supplement (ENSURE ENLIVE)  237 mL Oral BID BM  . mouth rinse  15 mL Mouth Rinse q12n4p  . meropenem (MERREM) IV  1 g Intravenous Q8H  . pantoprazole  40 mg Oral Daily  . tamsulosin  0.4 mg Oral Daily   Continuous Infusions: . dextrose 5 % with KCl 20 mEq / L 20 mEq (02/27/17 0818)   PRN Meds:.acetaminophen **OR** acetaminophen, diltiazem, haloperidol lactate, hydrALAZINE, levalbuterol, ondansetron **OR** ondansetron (ZOFRAN) IV Medications Prior to Admission:  Prior to Admission medications   Medication Sig Start Date End Date Taking? Authorizing Provider  Amino Acids-Protein Hydrolys (FEEDING SUPPLEMENT, PRO-STAT SUGAR FREE 64,) LIQD Take 30 mLs by mouth 2 (two) times daily. 02/02/17  Yes Elease Etienne, MD  amLODipine (NORVASC) 5 MG tablet Take 1 tablet (5 mg total) by mouth daily. 02/03/17  Yes Elease Etienne, MD  aspirin EC 81 MG tablet Take 81 mg by mouth daily.   Yes Historical Provider, MD  atenolol (TENORMIN) 25 MG tablet Take 1 tablet (25 mg total) by mouth daily. 11/25/16  Yes Dawn Marland Mcalpine, MD  bisacodyl (DULCOLAX) 5 MG EC tablet Take 5 mg by mouth daily as needed for moderate constipation.   Yes Historical Provider, MD  cefTRIAXone (ROCEPHIN) 1 g injection Inject 1 g into the muscle daily. Inject 1 gram intramuscular one  time a day for infection until IV Rocephin arrives  Started 03/01   Yes Historical Provider, MD  LORazepam (ATIVAN) 2 MG/ML injection Inject 1 mg into the muscle every 12 (twelve) hours as needed (for agitation). Inject 0.5 ml intramuscular every 12 hours as needed    Yes Historical Provider, MD  Multiple Vitamin (MULTIVITAMIN WITH MINERALS) TABS tablet Take 1 tablet by mouth daily. 02/03/17  Yes Elease Etienne, MD  Nutritional Supplements (NUTRITIONAL DRINK) LIQD Take 90 mLs by mouth daily. *House Supplement 2.0*   Yes Historical Provider, MD  risperiDONE (RISPERDAL) 0.5 MG tablet Take 0.5 mg by  mouth 2 (two) times daily.   Yes Historical Provider, MD  tamsulosin (FLOMAX) 0.4 MG CAPS capsule Take 1 capsule (0.4 mg total) by mouth daily. 02/03/17  Yes Elease Etienne, MD   Allergies  Allergen Reactions  . Codeine Anxiety  . Hydroxyzine Other (See Comments)    Makes things worse  . Seroquel [Quetiapine] Other (See Comments)    Makes things worse   Review of Systems  Unable to perform ROS: Dementia    Physical Exam  Vital Signs: BP (!) 149/92 (BP Location: Right Arm)   Pulse 86   Temp 97.8 F (36.6 C) (Axillary)   Resp 18   Ht 5\' 9"  (1.753 m)   Wt 68.9 kg (151 lb 14.4 oz)   SpO2 100%   BMI 22.43 kg/m  Pain Assessment: PAINAD   Pain Score: Asleep   SpO2: SpO2: 100 % O2 Device:SpO2: 100 % O2 Flow Rate: .O2 Flow Rate (L/min): 2 L/min  IO: Intake/output summary:  Intake/Output Summary (Last 24 hours) at 02/27/17 1148 Last data filed at 02/27/17 0655  Gross per 24 hour  Intake          3531.66 ml  Output              726 ml  Net          2805.66 ml    LBM: Last BM Date: 02/25/17 Baseline Weight: Weight: 82.6 kg (182 lb) Most recent weight: Weight: 68.9 kg (151 lb 14.4 oz)     Palliative Assessment/Data:   Flowsheet Rows   Flowsheet Row Most Recent Value  Intake Tab  Referral Department  Hospitalist  Unit at Time of Referral  Cardiac/Telemetry Unit  Palliative  Care Primary Diagnosis  Sepsis/Infectious Disease  Date Notified  02/26/17  Palliative Care Type  New Palliative care  Reason for referral  Clarify Goals of Care  Date of Admission  02/20/17  Date first seen by Palliative Care  02/27/17  # of days Palliative referral response time  1 Day(s)  # of days IP prior to Palliative referral  6  Clinical Assessment  Psychosocial & Spiritual Assessment  Palliative Care Outcomes     Discussed with Dr Edward Jolly  Time In: 1045 Time Out: 1200 Time Total: 75 min Greater than 50%  of this time was spent counseling and coordinating care related to the above assessment and plan.  Signed by: Lorinda Creed, NP   Please contact Palliative Medicine Team phone at [phone] for questions and concerns.  For individual provider: See Loretha Stapler

## 2017-02-27 NOTE — Progress Notes (Signed)
PT Cancellation Note  Patient Details Name: Scott Park MRN: [id number] DOB: 06/06/1933   Cancelled Treatment:    Reason Eval/Treat Not Completed: Other (comment) (RN reports pt not appropriate for PT today, sleeping, poor cognitive status)   Caytlin Better,KATHrine E 02/27/2017, 9:50 AM Zenovia JarredKati Daralyn Bert, PT, DPT 02/27/2017 Pager: [phone]

## 2017-03-02 LAB — CULTURE, BLOOD (ROUTINE X 2)
CULTURE: NO GROWTH
CULTURE: NO GROWTH
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
## 2017-03-21 NOTE — Telephone Encounter (Signed)
Closed encounter °

## 2017-03-23 DEATH — deceased

## 2017-04-26 IMAGING — US US RENAL
1 series · 14 of 25 positions shown · non-contrast
Comparison: None.

CLINICAL DATA: Acute kidney injury.

EXAM:
RENAL / URINARY TRACT ULTRASOUND COMPLETE

[Series 1: us renal · 0.28mm/px · 56 acquisitions, 14 frames shown]
[im 1/56]
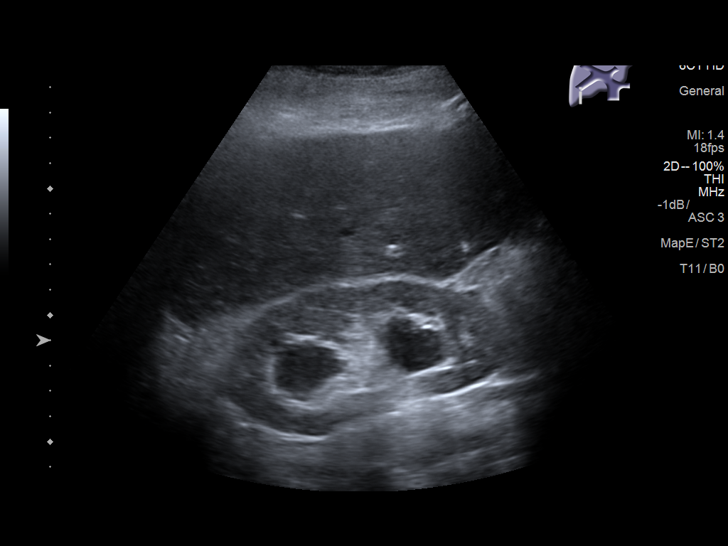
[im 5/56]
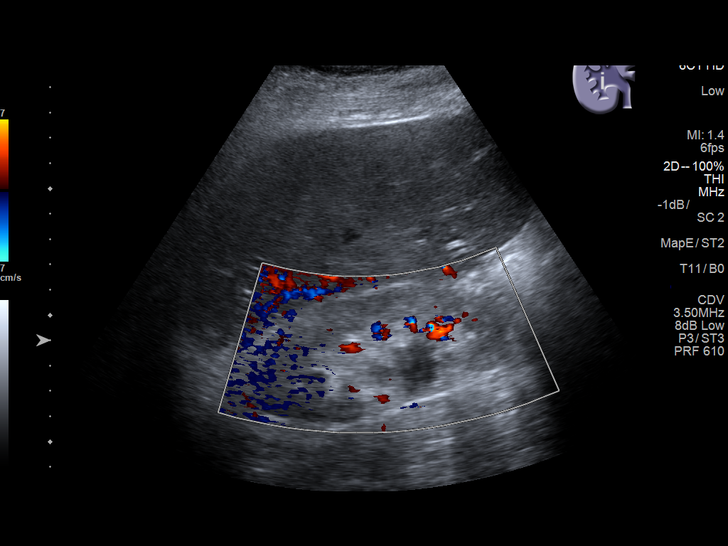
[im 10/56]
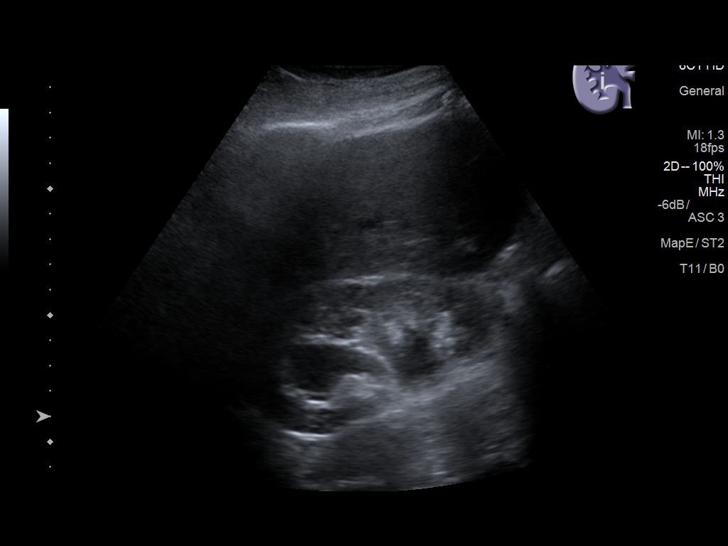
[im 14/56]
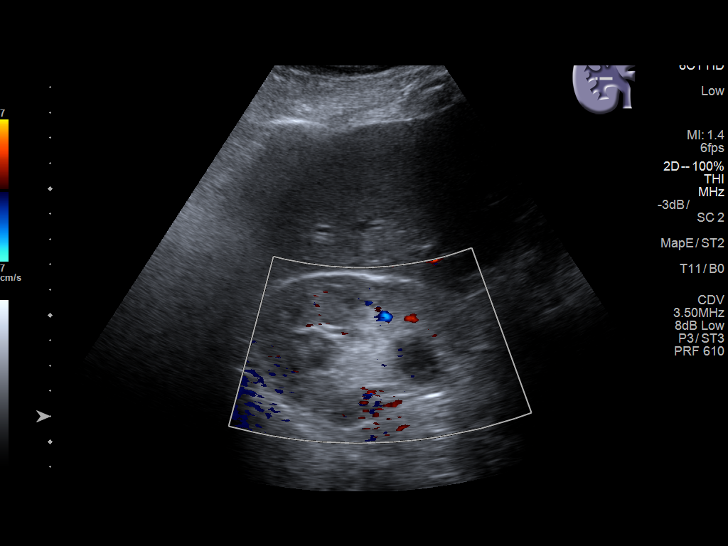
[im 19/56]
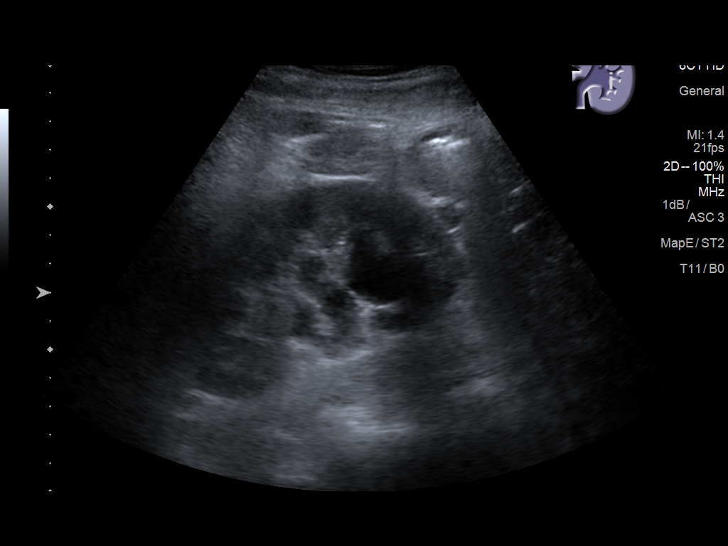
[im 21/56]
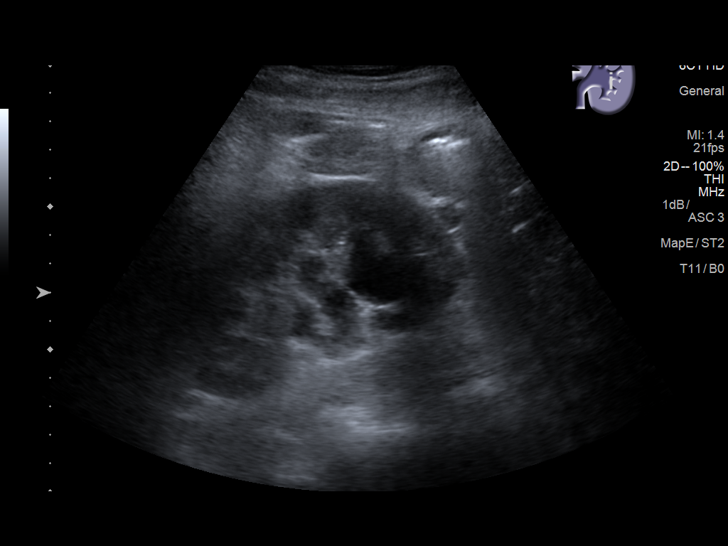
[im 26/56]
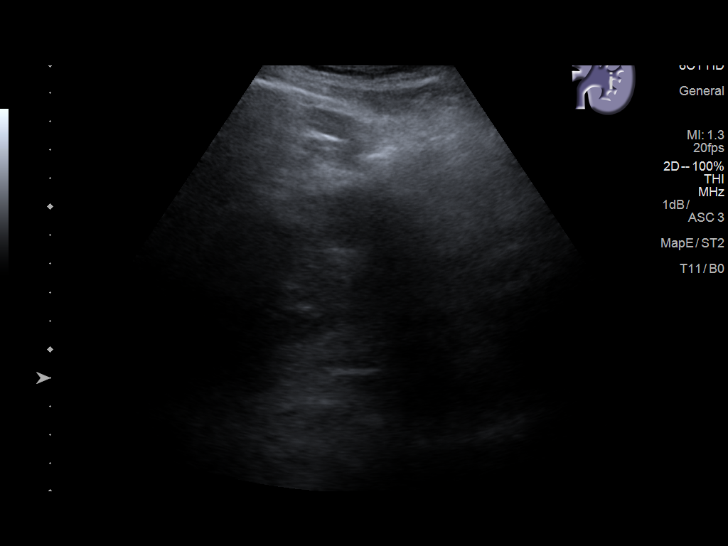
[im 30/56]
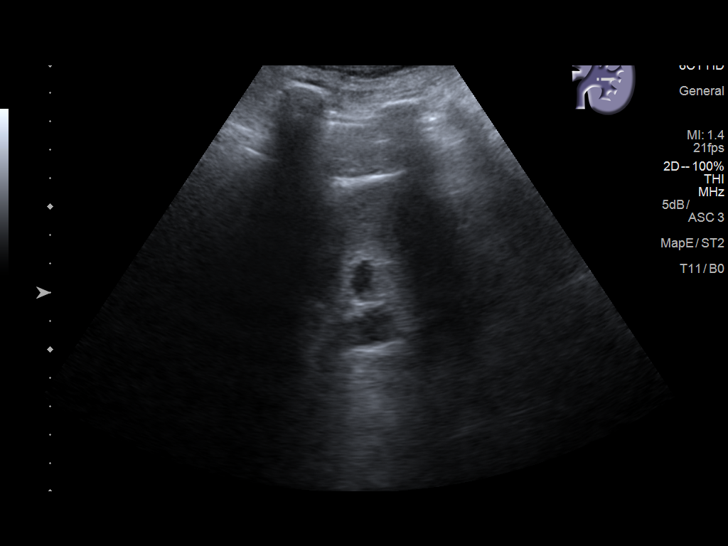
[im 35/56]
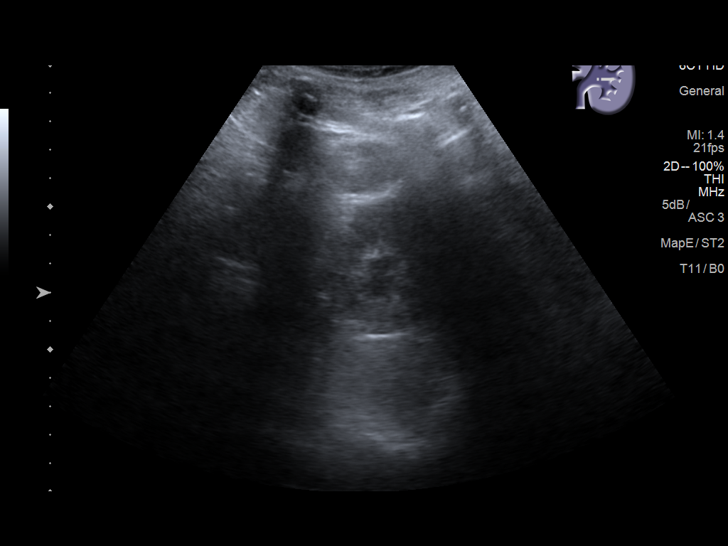
[im 37/56]
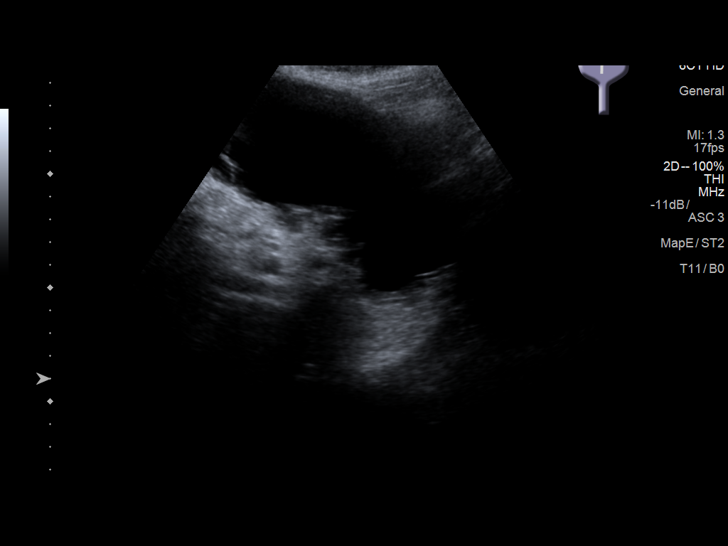
[im 42/56]
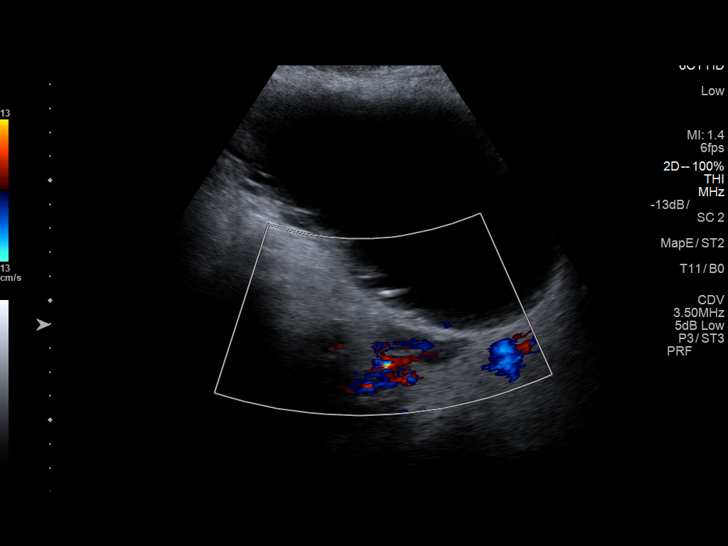
[im 46/56]
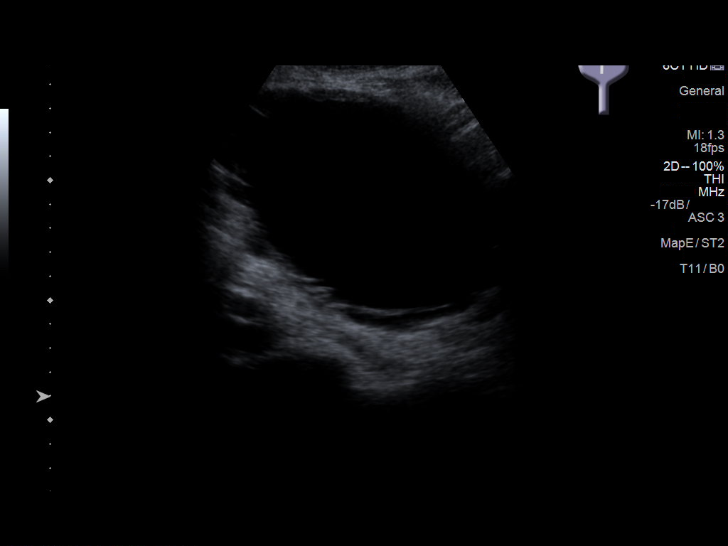
[im 51/56]
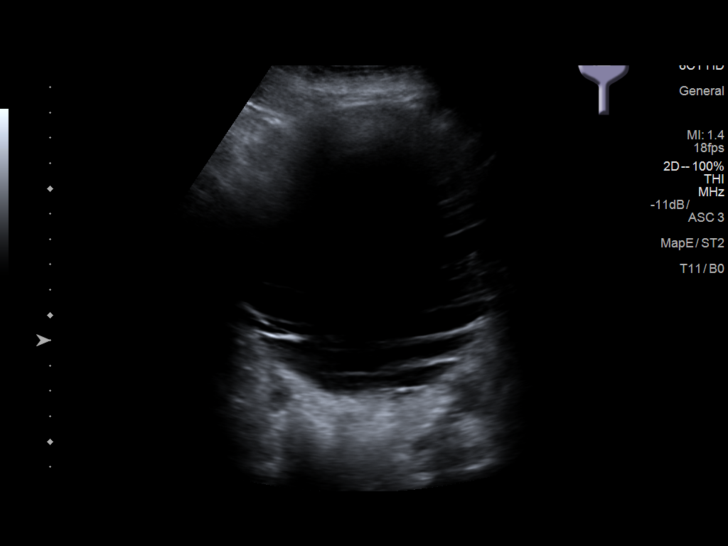
[im 56/56]
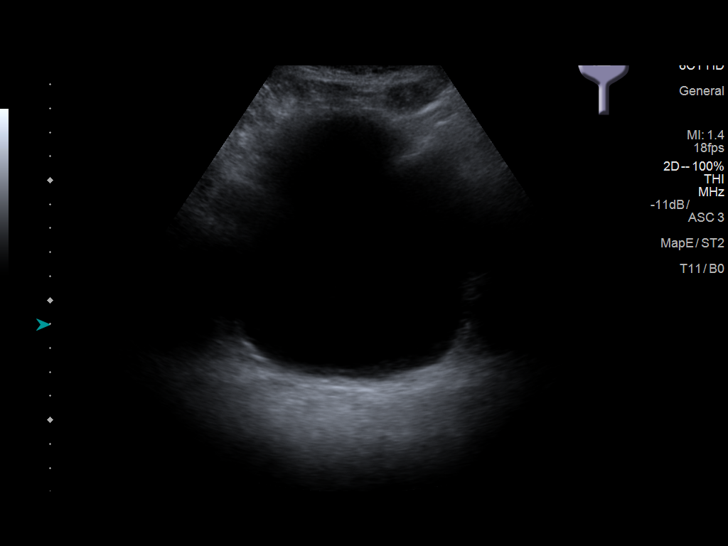

[14 of 25 positions shown; findings below may reference images not displayed]

FINDINGS: Right Kidney:

Length: 11.2 cm. Echogenicity is increased. Moderate hydronephrosis
visualized.

Left Kidney:

Length: 10.8. Echogenicity is increased. Moderate hydronephrosis
visualized.

Bladder:

Irregular, trabeculated appearance of the bladder wall is seen.
IMPRESSION: Moderate bilateral hydronephrosis.

Increased cortical echogenicity bilaterally consistent with medical
renal disease.

Trabeculated appearance of the urinary bladder wall suggestive of
bladder outlet obstruction.

## 2017-05-08 ENCOUNTER — Encounter: Payer: Self-pay | Admitting: Internal Medicine

## 2017-05-25 IMAGING — DX DG CHEST 1V PORT
1 series · 1 of 1 positions shown · non-contrast
Comparison: None.

CLINICAL DATA: Cough. Altered mental status for 1 day.
Leukocytosis.

EXAM:
PORTABLE CHEST 1 VIEW

[chest ap]
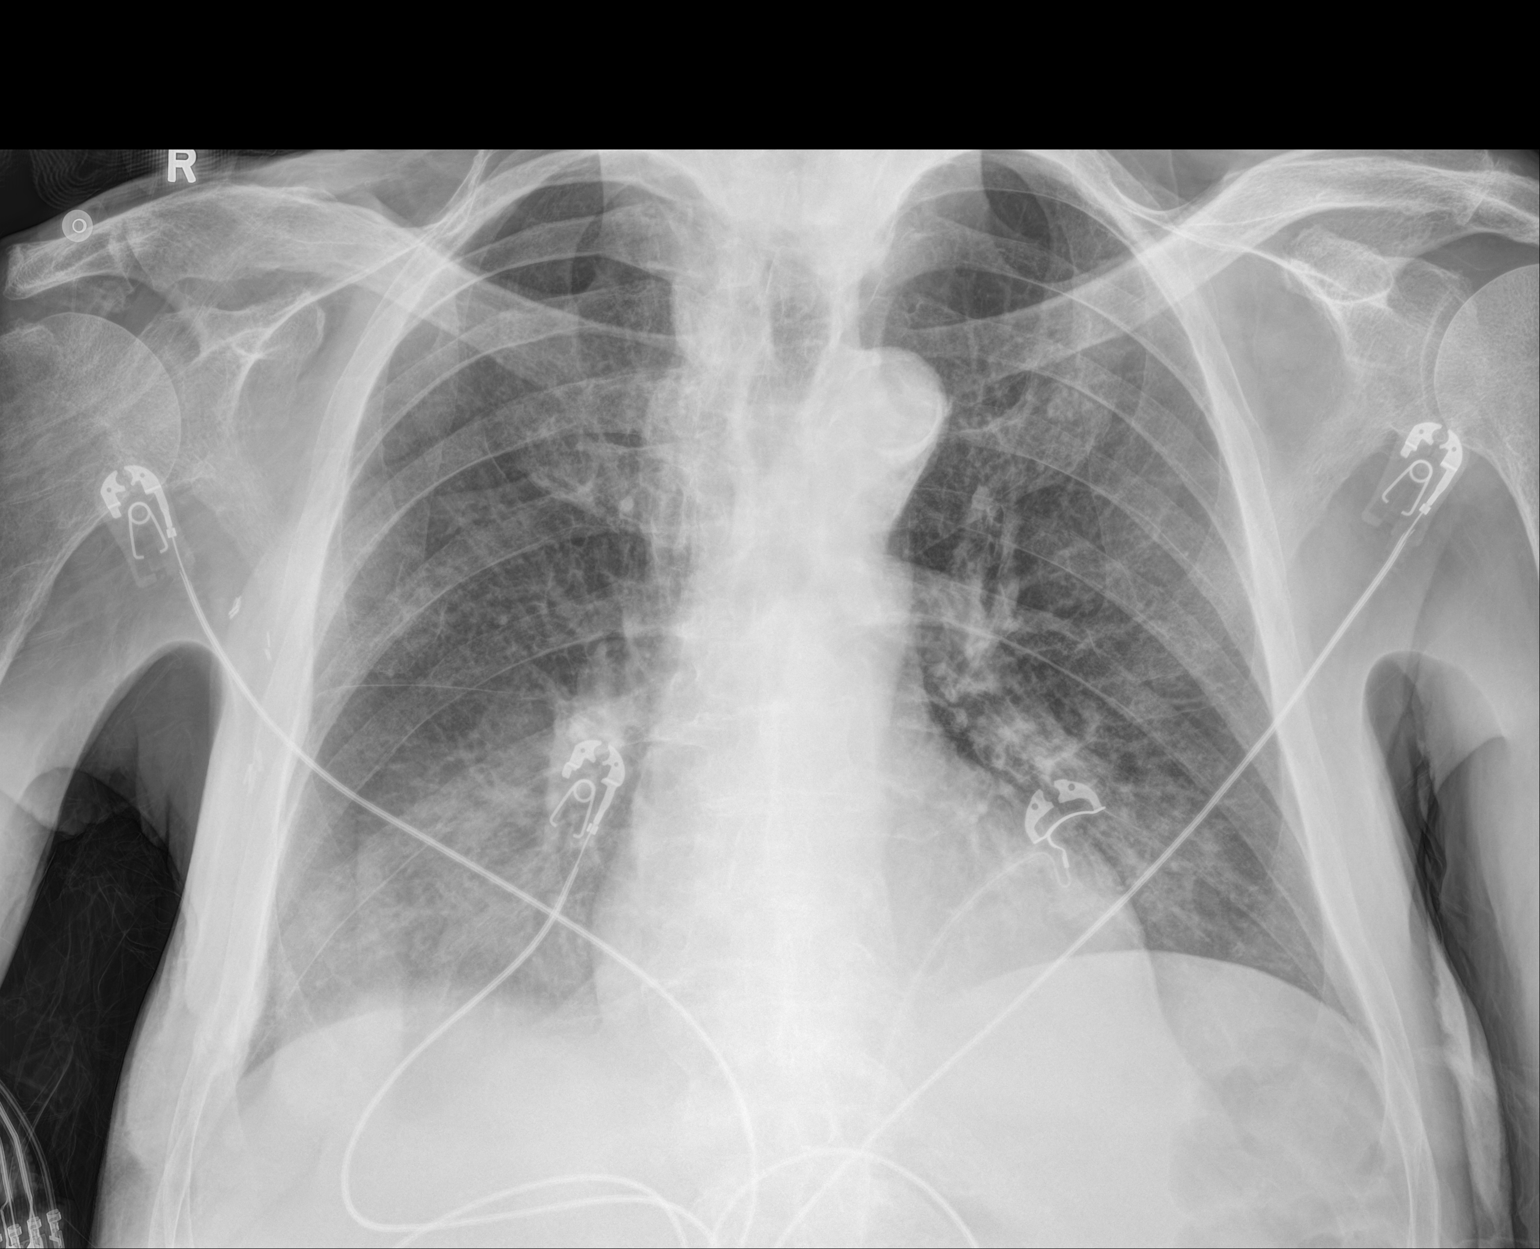

[1 of 1 positions shown; findings below may reference images not displayed]

FINDINGS: Hazy opacity of the right more than left bases, primarily concerning
for pneumonia in this setting. No edema, effusion, or pneumothorax.
Normal heart size and mediastinal contours.
IMPRESSION: Opacities at the right greater than left bases primarily concerning
for pneumonia or aspiration in this setting.
